# Patient Record
Sex: Female | Born: 1935
Health system: Southern US, Community
[De-identification: ages and names within clinical notes are randomized; demographics above are authoritative.]

## PROBLEM LIST (undated history)

## (undated) DIAGNOSIS — IMO0001 Reserved for inherently not codable concepts without codable children: Secondary | ICD-10-CM

## (undated) DIAGNOSIS — E538 Deficiency of other specified B group vitamins: Secondary | ICD-10-CM

## (undated) DIAGNOSIS — Z9889 Other specified postprocedural states: Secondary | ICD-10-CM

## (undated) DIAGNOSIS — J189 Pneumonia, unspecified organism: Secondary | ICD-10-CM

## (undated) DIAGNOSIS — Z9289 Personal history of other medical treatment: Secondary | ICD-10-CM

## (undated) DIAGNOSIS — M2550 Pain in unspecified joint: Secondary | ICD-10-CM

## (undated) DIAGNOSIS — M810 Age-related osteoporosis without current pathological fracture: Secondary | ICD-10-CM

## (undated) DIAGNOSIS — E785 Hyperlipidemia, unspecified: Secondary | ICD-10-CM

## (undated) DIAGNOSIS — K635 Polyp of colon: Secondary | ICD-10-CM

## (undated) DIAGNOSIS — E042 Nontoxic multinodular goiter: Secondary | ICD-10-CM

## (undated) DIAGNOSIS — D649 Anemia, unspecified: Secondary | ICD-10-CM

## (undated) DIAGNOSIS — I10 Essential (primary) hypertension: Secondary | ICD-10-CM

## (undated) DIAGNOSIS — I739 Peripheral vascular disease, unspecified: Secondary | ICD-10-CM

## (undated) DIAGNOSIS — E039 Hypothyroidism, unspecified: Secondary | ICD-10-CM

## (undated) DIAGNOSIS — Z5189 Encounter for other specified aftercare: Secondary | ICD-10-CM

## (undated) DIAGNOSIS — M1990 Unspecified osteoarthritis, unspecified site: Secondary | ICD-10-CM

## (undated) DIAGNOSIS — N189 Chronic kidney disease, unspecified: Secondary | ICD-10-CM

## (undated) DIAGNOSIS — R112 Nausea with vomiting, unspecified: Secondary | ICD-10-CM

## (undated) DIAGNOSIS — K649 Unspecified hemorrhoids: Secondary | ICD-10-CM

## (undated) HISTORY — DX: Nontoxic multinodular goiter: E04.2

## (undated) HISTORY — PX: FRACTURE SURGERY: SHX138

## (undated) HISTORY — DX: Hyperlipidemia, unspecified: E78.5

## (undated) HISTORY — DX: Unspecified osteoarthritis, unspecified site: M19.90

## (undated) HISTORY — DX: Reserved for inherently not codable concepts without codable children: IMO0001

## (undated) HISTORY — DX: Polyp of colon: K63.5

## (undated) HISTORY — DX: Anemia, unspecified: D64.9

## (undated) HISTORY — DX: Deficiency of other specified B group vitamins: E53.8

## (undated) HISTORY — DX: Encounter for other specified aftercare: Z51.89

## (undated) HISTORY — PX: HEMORRHOIDECTOMY WITH HEMORRHOID BANDING: SHX5633

## (undated) HISTORY — DX: Essential (primary) hypertension: I10

---
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
## 1998-05-31 ENCOUNTER — Other Ambulatory Visit: Admission: RE | Admit: 1998-05-31 | Discharge: 1998-05-31 | Payer: Self-pay | Admitting: *Deleted

## 1998-10-04 ENCOUNTER — Other Ambulatory Visit: Admission: RE | Admit: 1998-10-04 | Discharge: 1998-10-04 | Payer: Self-pay | Admitting: *Deleted

## 1998-10-10 ENCOUNTER — Ambulatory Visit (HOSPITAL_COMMUNITY): Admission: RE | Admit: 1998-10-10 | Discharge: 1998-10-10 | Payer: Self-pay | Admitting: *Deleted

## 1998-10-10 ENCOUNTER — Encounter: Payer: Self-pay | Admitting: *Deleted

## 1999-02-23 ENCOUNTER — Encounter: Admission: RE | Admit: 1999-02-23 | Discharge: 1999-05-24 | Payer: Self-pay | Admitting: Family Medicine

## 2000-02-15 ENCOUNTER — Other Ambulatory Visit: Admission: RE | Admit: 2000-02-15 | Discharge: 2000-02-15 | Payer: Self-pay | Admitting: *Deleted

## 2001-02-27 ENCOUNTER — Other Ambulatory Visit: Admission: RE | Admit: 2001-02-27 | Discharge: 2001-02-27 | Payer: Self-pay | Admitting: *Deleted

## 2001-06-09 ENCOUNTER — Ambulatory Visit (HOSPITAL_COMMUNITY): Admission: RE | Admit: 2001-06-09 | Discharge: 2001-06-09 | Payer: Self-pay | Admitting: Gastroenterology

## 2001-06-09 ENCOUNTER — Encounter (INDEPENDENT_AMBULATORY_CARE_PROVIDER_SITE_OTHER): Payer: Self-pay

## 2002-11-18 ENCOUNTER — Encounter (INDEPENDENT_AMBULATORY_CARE_PROVIDER_SITE_OTHER): Payer: Self-pay | Admitting: *Deleted

## 2002-11-18 ENCOUNTER — Ambulatory Visit (HOSPITAL_COMMUNITY): Admission: RE | Admit: 2002-11-18 | Discharge: 2002-11-18 | Payer: Self-pay | Admitting: Gastroenterology

## 2003-09-02 ENCOUNTER — Other Ambulatory Visit: Admission: RE | Admit: 2003-09-02 | Discharge: 2003-09-02 | Payer: Self-pay | Admitting: Family Medicine

## 2007-09-02 ENCOUNTER — Encounter: Admission: RE | Admit: 2007-09-02 | Discharge: 2007-09-02 | Payer: Self-pay | Admitting: Orthopaedic Surgery

## 2011-04-06 NOTE — Procedures (Signed)
[facility]  Patient:    Alisha [name], Alisha [name]                    MRN: [id number] Proc. Date: 06/09/01 Attending:  Verlin Grills, M.D. CC:         Gretta Arab. Valentina Lucks, M.D.   Procedure Report  PROCEDURE:  Colonoscopy with polypectomy.  PROCEDURE INDICATION:  Ms. Alisha [name] (date of birth [birth year]) is a 77 year old female with Hemoccult positive stool and normal hemoglobin.  I discussed with [name] the complications associated with colonoscopy and polypectomy including intestinal bleeding and intestinal perforation.  [name] has signed the operative permit.  ENDOSCOPIST:  Verlin Grills, M.D.  PREMEDICATION:  Versed 7.5 mg, Demerol 50 mg.  ENDOSCOPE:  Olympus pediatric colonoscope.  DESCRIPTION OF PROCEDURE:  After obtaining informed consent, [name] was placed in the left lateral decubitus position.  I administered intravenous Demerol and intravenous Versed to achieve conscious sedation for the procedure.  The patients blood pressure, oxygen saturations, and cardiac rhythm were monitored throughout the procedure and documented in the medical record.  Anal inspection was normal.  Digital rectal exam was normal.  The Olympus pediatric video colonoscope was introduced into the rectum and easily advanced to the cecum.  Colonic preparation for the exam today was excellent.  RECTUM:  Normal.  SIGMOID COLON AND DESCENDING COLON:  From the distal sigmoid colon, at 40 cm from the anal verge, a 3 mm sessile polyp was removed with electrocautery snare.  From the distal descending colon, three 0.5 mm sessile polyps were removed with the cold biopsy forceps.  All polyps were submitted in one bottle for pathological evaluation.  SPLENIC FLEXURE:  Normal.  TRANSVERSE COLON:  Normal.  HEPATIC FLEXURE:  Normal.  ASCENDING COLON:  Normal.  CECUM AND ILEOCECAL VALVE:  Normal.  ASSESSMENT:  From the left colon, a 3 mm sessile  polyp was removed with the electrocautery snare, and three 0.5 mm sessile polyps were removed with the cold biopsy forceps.  RECOMMENDATIONS:  If the colonic polyps return neoplastic, [name] should undergo a repeat colonoscopy in five years.  If the colon polyp are nonneoplastic pathologically, [name] should undergo a repeat colonoscopy in approximately 10 years. DD:  06/09/01 TD:  06/07/01 Job: [id number]

## 2011-04-06 NOTE — Op Note (Signed)
NAME:  NADJA, LINA NO.:  [id number]   MEDICAL RECORD NO.:  [id number]                   PATIENT TYPE:   LOCATION:                                       FACILITY:  [facility]   PHYSICIAN:  Danise Edge, M.D.                DATE OF BIRTH:   DATE OF PROCEDURE:  11/18/2002  DATE OF DISCHARGE:                                 OPERATIVE REPORT   PROCEDURE INDICATION:  The patient is a 77 year old female with iron  deficiency anemia and guaiac-positive stool.   ENDOSCOPIST:  Danise Edge, M.D.   PREMEDICATION:  Versed 7 mg, Demerol 50 mg.   ENDOSCOPE:  Olympus pediatric gastroscope.   PROCEDURE:  Esophagogastroduodenoscopy with small bowel biopsies.   DESCRIPTION OF PROCEDURE:  After obtaining informed consent, the patient was  placed in the left lateral decubitus position.  I administered intravenous  Demerol and intravenous Versed to achieve conscious sedation for the  procedure.  The patient's blood pressure, oxygen saturation and cardiac  rhythm were monitored throughout the procedure and documented in the medical  record.   The Olympus gastroscope was passed through the posterior hypopharynx into  the proximal esophagus without difficulty.  The hypopharynx, larynx and  vocal cords appeared normal.   Esophagoscopy:  The proximal, mid and lower segments of the esophagus appear  normal.   Gastroscopy:  A retroflexed view of the gastric cardia and fundus was  normal.  The gastric body, antrum and pylorus appear normal.   Duodenoscopy:  The duodenal bulb, mid-duodenum, distal duodenum and proximal  jejunum appear normal endoscopically.   Biopsies:  Multiple biopsies were taken from the second-third portions of  the duodenum to rule out celiac sprue.   ASSESSMENT:  Normal esophagogastroduodenoscopy using the pediatric  gastroscope.  Small bowel biopsies pending.   PROCEDURE:  Proctocolonoscopy to the cecum.   DESCRIPTION OF PROCEDURE:  Anal  inspection was normal.  Digital rectal exam  was normal.  The Olympus pediatric video colonoscope was introduced into the  rectum and easily advanced to the cecum.  The normal-appearing ileocecal  valve was intubated and the distal ileum inspected.  Colonic preparation for  the exam today was excellent.  Rectum:  Normal.  Moderate-sized internal  hemorrhoids are noted.  Sigmoid colon and descending colon normal.  Splenic flexure normal.  Transverse colon normal.  Hepatic flexure normal.  Ascending colon:  There are three 7-mm lipomas in the ascending colon which  were not biopsied.  Cecum and ileocecal valve:  There is a 2 x 2-mm  arteriovenous malformation in the proximal cecum which was endoclipped.  Distal ileum normal.   ASSESSMENT:  1. No signs of colorectal neoplasm or lower gastrointestinal bleeding.  2. Three small lipomas are present in the ascending colon.  3. A small arteriovenous malformation in the proximal cecum was endoclipped.   RECOMMENDATIONS:  The patient should remain  on iron to assess her response  to iron therapy and increasing her hemoglobin.  If there is no significant  increase in her hemoglobin on iron and her small bowel biopsies are normal,  she will need wireless capsule endoscopy of the small intestine.                                               Danise Edge, M.D.    MJ/MEDQ  D:  11/18/2002  T:  11/18/2002  Job:  [id number]

## 2011-07-08 ENCOUNTER — Emergency Department (HOSPITAL_COMMUNITY): Payer: Medicare Other

## 2011-07-08 ENCOUNTER — Inpatient Hospital Stay (HOSPITAL_COMMUNITY)
Admission: EM | Admit: 2011-07-08 | Discharge: 2011-07-14 | DRG: 481 | Disposition: A | Discharge status: Skilled Nursing Facility | Payer: Medicare Other | Attending: Orthopedic Surgery | Admitting: Orthopedic Surgery

## 2011-07-08 DIAGNOSIS — E049 Nontoxic goiter, unspecified: Secondary | ICD-10-CM | POA: Diagnosis present

## 2011-07-08 DIAGNOSIS — W010XXA Fall on same level from slipping, tripping and stumbling without subsequent striking against object, initial encounter: Secondary | ICD-10-CM | POA: Diagnosis present

## 2011-07-08 DIAGNOSIS — Y92009 Unspecified place in unspecified non-institutional (private) residence as the place of occurrence of the external cause: Secondary | ICD-10-CM

## 2011-07-08 DIAGNOSIS — N179 Acute kidney failure, unspecified: Secondary | ICD-10-CM | POA: Diagnosis not present

## 2011-07-08 DIAGNOSIS — E871 Hypo-osmolality and hyponatremia: Secondary | ICD-10-CM | POA: Diagnosis not present

## 2011-07-08 DIAGNOSIS — S72143A Displaced intertrochanteric fracture of unspecified femur, initial encounter for closed fracture: Principal | ICD-10-CM | POA: Diagnosis present

## 2011-07-08 DIAGNOSIS — I10 Essential (primary) hypertension: Secondary | ICD-10-CM | POA: Diagnosis present

## 2011-07-08 DIAGNOSIS — D62 Acute posthemorrhagic anemia: Secondary | ICD-10-CM | POA: Diagnosis not present

## 2011-07-08 DIAGNOSIS — E119 Type 2 diabetes mellitus without complications: Secondary | ICD-10-CM | POA: Diagnosis present

## 2011-07-08 DIAGNOSIS — Z86718 Personal history of other venous thrombosis and embolism: Secondary | ICD-10-CM

## 2011-07-08 HISTORY — PX: FEMUR SURGERY: SHX943

## 2011-07-08 LAB — URINALYSIS, ROUTINE W REFLEX MICROSCOPIC
Bilirubin Urine: NEGATIVE
Glucose, UA: NEGATIVE mg/dL
Ketones, ur: NEGATIVE mg/dL
Leukocytes, UA: NEGATIVE
Nitrite: NEGATIVE
Specific Gravity, Urine: 1.021 (ref 1.005–1.030)
pH: 5 (ref 5.0–8.0)

## 2011-07-08 LAB — HEPATIC FUNCTION PANEL
Alkaline Phosphatase: 45 U/L (ref 39–117)
Bilirubin, Direct: 0.1 mg/dL (ref 0.0–0.3)
Indirect Bilirubin: 0.2 mg/dL — ABNORMAL LOW (ref 0.3–0.9)
Total Bilirubin: 0.3 mg/dL (ref 0.3–1.2)

## 2011-07-08 LAB — CBC
Hemoglobin: 11.1 g/dL — ABNORMAL LOW (ref 12.0–15.0)
MCH: 27.1 pg (ref 26.0–34.0)
MCV: 84.1 fL (ref 78.0–100.0)
Platelets: 259 10*3/uL (ref 150–400)
RBC: 4.1 MIL/uL (ref 3.87–5.11)
WBC: 9 10*3/uL (ref 4.0–10.5)

## 2011-07-08 LAB — BASIC METABOLIC PANEL
CO2: 28 mEq/L (ref 19–32)
Chloride: 100 mEq/L (ref 96–112)
GFR calc Af Amer: 60 mL/min — ABNORMAL LOW (ref >60)
Potassium: 4.3 mEq/L (ref 3.5–5.1)
Sodium: 138 mEq/L (ref 135–145)

## 2011-07-08 LAB — DIFFERENTIAL
Eosinophils Absolute: 0.2 10*3/uL (ref 0.0–0.7)
Lymphocytes Relative: 17 % (ref 12–46)
Lymphs Abs: 1.5 10*3/uL (ref 0.7–4.0)
Monocytes Relative: 7 % (ref 3–12)
Neutro Abs: 6.6 10*3/uL (ref 1.7–7.7)
Neutrophils Relative %: 74 % (ref 43–77)

## 2011-07-08 LAB — PROTIME-INR: Prothrombin Time: 12.6 seconds (ref 11.6–15.2)

## 2011-07-09 LAB — TSH: TSH: 6.025 u[IU]/mL — ABNORMAL HIGH (ref 0.350–4.500)

## 2011-07-09 LAB — GLUCOSE, CAPILLARY

## 2011-07-09 LAB — POCT I-STAT 4, (NA,K, GLUC, HGB,HCT)
Glucose, Bld: 164 mg/dL — ABNORMAL HIGH (ref 70–99)
Potassium: 4.5 mEq/L (ref 3.5–5.1)
Sodium: 138 mEq/L (ref 135–145)

## 2011-07-09 LAB — URINE CULTURE

## 2011-07-09 LAB — PROTIME-INR
INR: 1.19 (ref 0.00–1.49)
Prothrombin Time: 15.4 seconds — ABNORMAL HIGH (ref 11.6–15.2)

## 2011-07-09 LAB — PREPARE RBC (CROSSMATCH)

## 2011-07-09 LAB — T3, FREE: T3, Free: 3.3 pg/mL (ref 2.3–4.2)

## 2011-07-09 NOTE — H&P (Signed)
  NAMEKIMIA, FINAN NO.:  [id number]  MEDICAL RECORD NO.:  [id number]  LOCATION:  1611                         FACILITY:  [facility]  PHYSICIAN:  Myrtie Neither, MD      DATE OF BIRTH:  September 08, 1932  DATE OF ADMISSION:  07/08/2011 DATE OF DISCHARGE:                             HISTORY & PHYSICAL   CHIEF COMPLAINT:  Painful left hip.  HISTORY OF PRESENT ILLNESS:  This is a 77 year old female who was adjusting a chair before sitting __________ and appeared to have missed her step, falling and landing onto her left hip.  Patient complained of severe pain at the left hip and was brought to [facility] Emergency Department for treatment.  The patient denies any loss of consciousness or sustaining any other injuries.  PAST MEDICAL HISTORY: 1. Hypertension. 2. Diabetes mellitus.  ALLERGIES:  None known.         MEDICATIONS:  Are that antihypertensive and will use diabetic type 2 medications.  SOCIAL HISTORY:  No history of use of illicit drugs, alcohol, or tobacco.  REVIEW OF SYSTEMS:  __________ history of present illness, no cardiac, respiratory, urinary, or bowel symptoms.  FAMILY HISTORY:  Noncontributory.  PHYSICAL EXAMINATION:  GENERAL:  Alert, oriented, in no acute distress. VITAL SIGNS:  Temperature 98, pulse 85, respirations 20, blood pressure 162/89, O2 saturation is 98%. HEENT:  Head normocephalic.  Eyes, conjunctivae clear. NECK:  Supple. CHEST:  Clear.  No rib tenderness.  Good respiratory excursion. EXTREMITIES:  Patient is able to actively move both upper extremities and right lower extremity.  Left hip, tender anterolaterally, short, and externally rotated.  Pulses are intact.  X-RAY:  Revealed comminuted intertrochanteric fracture of the left hip.  IMPRESSION: 1. Comminuted intertrochanteric fracture of the left hip. 2. History of high blood pressure. 3. History of diabetes mellitus.  PLAN:  ORIF, left hip.     Myrtie Neither,  MD     AC/MEDQ  D:  07/08/2011  T:  07/09/2011  Job:  [id number]  Electronically Signed by Myrtie Neither MD on 07/09/2011 02:04:47 PM

## 2011-07-10 ENCOUNTER — Other Ambulatory Visit (HOSPITAL_COMMUNITY): Payer: No Typology Code available for payment source

## 2011-07-10 ENCOUNTER — Inpatient Hospital Stay (HOSPITAL_COMMUNITY): Payer: Medicare Other

## 2011-07-10 LAB — BASIC METABOLIC PANEL
BUN: 23 mg/dL (ref 6–23)
Creatinine, Ser: 1.07 mg/dL (ref 0.50–1.10)
GFR calc Af Amer: >60 mL/min (ref >60)
GFR calc non Af Amer: 50 mL/min — ABNORMAL LOW (ref >60)
Potassium: 4.3 mEq/L (ref 3.5–5.1)

## 2011-07-10 LAB — CBC
HCT: 28.3 % — ABNORMAL LOW (ref 36.0–46.0)
MCHC: 32.9 g/dL (ref 30.0–36.0)
MCV: 85.2 fL (ref 78.0–100.0)
Platelets: 173 10*3/uL (ref 150–400)
RDW: 14.1 % (ref 11.5–15.5)
WBC: 12.2 10*3/uL — ABNORMAL HIGH (ref 4.0–10.5)

## 2011-07-10 LAB — GLUCOSE, CAPILLARY
Glucose-Capillary: 175 mg/dL — ABNORMAL HIGH (ref 70–99)
Glucose-Capillary: 136 mg/dL — ABNORMAL HIGH (ref 70–99)
Glucose-Capillary: 119 mg/dL — ABNORMAL HIGH (ref 70–99)
Glucose-Capillary: 110 mg/dL — ABNORMAL HIGH (ref 70–99)

## 2011-07-10 LAB — PROTIME-INR: Prothrombin Time: 14.8 seconds (ref 11.6–15.2)

## 2011-07-11 LAB — BASIC METABOLIC PANEL
BUN: 26 mg/dL — ABNORMAL HIGH (ref 6–23)
Calcium: 8.3 mg/dL — ABNORMAL LOW (ref 8.4–10.5)
Creatinine, Ser: 1.29 mg/dL — ABNORMAL HIGH (ref 0.50–1.10)
GFR calc non Af Amer: 40 mL/min — ABNORMAL LOW (ref >60)
Glucose, Bld: 89 mg/dL (ref 70–99)
Potassium: 4.4 mEq/L (ref 3.5–5.1)

## 2011-07-11 LAB — CBC
MCH: 28.3 pg (ref 26.0–34.0)
MCHC: 33.1 g/dL (ref 30.0–36.0)
MCV: 85.7 fL (ref 78.0–100.0)
Platelets: 191 10*3/uL (ref 150–400)
RDW: 14.3 % (ref 11.5–15.5)

## 2011-07-11 LAB — GLUCOSE, CAPILLARY

## 2011-07-12 LAB — GLUCOSE, CAPILLARY
Glucose-Capillary: 121 mg/dL — ABNORMAL HIGH (ref 70–99)
Glucose-Capillary: 129 mg/dL — ABNORMAL HIGH (ref 70–99)
Glucose-Capillary: 156 mg/dL — ABNORMAL HIGH (ref 70–99)
Glucose-Capillary: 98 mg/dL (ref 70–99)

## 2011-07-12 LAB — HEMOGLOBIN AND HEMATOCRIT, BLOOD
HCT: 29.9 % — ABNORMAL LOW (ref 36.0–46.0)
Hemoglobin: 10.2 g/dL — ABNORMAL LOW (ref 12.0–15.0)

## 2011-07-12 LAB — BASIC METABOLIC PANEL
CO2: 29 mEq/L (ref 19–32)
Chloride: 97 mEq/L (ref 96–112)
Creatinine, Ser: 1.06 mg/dL (ref 0.50–1.10)
Glucose, Bld: 119 mg/dL — ABNORMAL HIGH (ref 70–99)

## 2011-07-12 LAB — CBC
Hemoglobin: 7.7 g/dL — ABNORMAL LOW (ref 12.0–15.0)
MCV: 84.6 fL (ref 78.0–100.0)
Platelets: 223 10*3/uL (ref 150–400)
RBC: 2.72 MIL/uL — ABNORMAL LOW (ref 3.87–5.11)
WBC: 7.6 10*3/uL (ref 4.0–10.5)

## 2011-07-12 LAB — CROSSMATCH
Antibody Screen: NEGATIVE
Unit division: 0

## 2011-07-13 LAB — CROSSMATCH
ABO/RH(D): A POS
Antibody Screen: NEGATIVE
Unit division: 0

## 2011-07-13 LAB — CBC
Platelets: 245 10*3/uL (ref 150–400)
RDW: 14.5 % (ref 11.5–15.5)
WBC: 8.5 10*3/uL (ref 4.0–10.5)

## 2011-07-13 LAB — BASIC METABOLIC PANEL
Calcium: 8.4 mg/dL (ref 8.4–10.5)
Creatinine, Ser: 0.9 mg/dL (ref 0.50–1.10)
GFR calc non Af Amer: >60 mL/min (ref >60)
Sodium: 135 mEq/L (ref 135–145)

## 2011-07-13 LAB — GLUCOSE, CAPILLARY
Glucose-Capillary: 137 mg/dL — ABNORMAL HIGH (ref 70–99)
Glucose-Capillary: 123 mg/dL — ABNORMAL HIGH (ref 70–99)

## 2011-07-13 LAB — PROTIME-INR: INR: 2.18 — ABNORMAL HIGH (ref 0.00–1.49)

## 2011-07-16 LAB — GLUCOSE, CAPILLARY: Glucose-Capillary: 81 mg/dL (ref 70–99)

## 2011-07-17 NOTE — Discharge Summary (Signed)
NAMEMarland Kitchen  [name], Alisha [name] NO.:  [id number]  MEDICAL RECORD NO.:  [id number]  LOCATION:  1611                         FACILITY:  [facility]  PHYSICIAN:  Myrtie Neither, MD      DATE OF BIRTH:  08-14-1932  DATE OF ADMISSION:  07/08/2011 DATE OF DISCHARGE:  07/13/2011                              DISCHARGE SUMMARY   ADMITTING DIAGNOSES:  Comminuted intertrochanteric fracture of the left hip with history of hypertension, diabetes mellitus, thrombus in the past, and hernia repair.  DISCHARGE DIAGNOSES:  Comminuted intertrochanteric fracture of left hip with history of hypertension, diabetes mellitus, thrombus in the past, hernia repair, also acute blood loss anemia, and thyroid goiter.  INFECTIONS:  None.  OPERATION:  Open reduction and internal fixation and intramedullary nailing, left hip intertrochanteric fracture.  The patient received 4 units of packed blood cells on hospital stay.  PERTINENT HISTORY:  This is a 77 year old female who has lost her balance while moving a chair and sustained injury to her left hip.  The patient was brought to [facility] Emergency Room for treatment, and was found to have comminuted intertrochanteric 3-part fracture of the left hip.  THE left hip was externally rotated and_shortened .  Neurovascular status was intact.  X-ray reveals comminuted 3-part fracture of the left intertrochanteric fracture.  HOSPITAL COURSE:  The patient underwent preop laboratories, CBC, EKG, chest x-ray, PT/PTT, platelet count, UA, and C-MET.  The patient also seen by in-house in the office for her hypertension and diabetes as well as evaluation for a finding seen on chest x-rays suggesting enlargement of thyroid.  The patient was found to be stable enough to undergo surgery.  The patient underwent ORIF with IM nailing of the intertrochanteric fracture, left hip.  The patient received 2 units of packed cells during the procedure and 2 units postop.  The  patient was started on physical therapy, nonweightbearing on the left side, progressed to toe-touch weightbearing on the left side.  Pain was eventually brought under control.  The patient received _pre and postoperative IV antibiotics.  CT of the thyroid revealed 2 thyroid nodules, to be evaluated on an outpatient basis.  The patient was euthyroid.  Wound was healing quite well.  The patients pain was controlled with the use of Vicodin 1-2 q.6 p.r.n. for pain.  The patient is on prophylactic Coumadin 5 mg daily, INRs, Vicodin 1-2 q.6 p.r.n. for pain.  MEDICATIONS: 1. Multivitamins daily. 2. The patient's aspirin is on hold. 3. The patient takes glipizide XL 5 mg daily. 4. Metformin 850 mg 3 times a day. 5. Toprol-XL 100 mg daily. 6. Diovan HCT 160/25. 7. Valsartan/hydrochlorothiazide 160/25 daily. 8. __________ injection every 3 months. 9. Zetia 10 mg daily. 10.Ferrous sulfate 325 mg t.i.d. 11.Fish oil over-the-counter __________daily. 12.Os-Cal daily. 13.Coumadin 5 mg daily. 14.Ambien 5 mg h.s. p.r.n. 15.Colace 100 mg b.i.d.  The patient is to continue toe-touch weightbearing on the left side with physical therapy.  Daily dressing changes to the left hip.  The patient is to return to office in 10 days.  The patient being discharged in a stable and satisfactory condition.     Myrtie Neither, MD  AC/MEDQ  D:  07/13/2011  T:  07/13/2011  Job:  [id number]  Electronically Signed by Myrtie Neither MD on 07/17/2011 12:16:05 PM

## 2011-07-17 NOTE — Op Note (Signed)
  NAMEMarland Kitchen  Alisha, [name] NO.:  [id number]  MEDICAL RECORD NO.:  [id number]  LOCATION:  1611                         FACILITY:  [facility]  PHYSICIAN:  Myrtie Neither, MD      DATE OF BIRTH:  June 01, 1932  DATE OF PROCEDURE:  07/08/2011 DATE OF DISCHARGE:                              OPERATIVE REPORT   PREOPERATIVE DIAGNOSIS:  Comminuted intertrochanteric 3-part fracture, left hip.  POSTOPERATIVE DIAGNOSIS:  Comminuted intertrochanteric 3-part fracture, left hip.  OPERATION:  Open reduction and internal fixation, left hip, with intramedullary nailing and K-wire, Zimmer.  PROCEDURE IN DETAIL:  The patient was taken to the operating room, given preop medications, given general anesthesia, and intubated.  The patient was placed on a fracture table.  Manipulated reduction of the fracture was done initially with use of C-arm visibility.  Next, the left hip was prepped with DuraPrep and draped in sterile manner.  Lateral incision made over the left hip over the trochanter going through the skin and subcutaneous tissue down to the fascia and the vastus lateralis.  Soft tissue was subperiosteally elevated from the trochanter.  Medial border of the greater trochanter was palpated.  Guidewire with an awl was put in place and visualized with the C-arm.  Guidewire was placed down the trochanter into the femoral canal with manipulation of the extremity into abduction as well as adduction.  After placement of the guidewire, reaming was done up to 13 mm followed by placement of a 180 mm, size 11 IM nail.  This was hammered in place over the guidewire under the guidance of C-arm.  Next, femoral head screw guide was in place.  Trocar was placed through the guidewire laterally into the femoral head.  Large screw was measured 100 mm, secondary screw was that of 75 mm.  This cross-fixed into the femoral head and across the fracture site. Fracture was held, reduced with the 3rd fragment  being approximated with the use of Zimmer cable.  This was visualized in AP and lateral view. Third transfixing distal screw was put in place, which is 32 mm.  Set screw proximally was screwed down.  AP and lateral of the left hip visualized, good reduction as well as good placement.  Copious and abundant irrigation was done.  The patient did receive 2 units of packed cells during the procedure.  Wound closure was done with #1 Vicryl for the fascia, 2-0 for the subcutaneous and skin staples for the skin. Compressive dressing was applied.  The patient tolerated the procedure quite well, went to recovery room in stable and satisfactory condition.     Myrtie Neither, MD     AC/MEDQ  D:  07/08/2011  T:  07/09/2011  Job:  [id number]  Electronically Signed by Myrtie Neither MD on 07/17/2011 12:09:31 PM

## 2011-07-24 NOTE — Consult Note (Signed)
NAME:  Alisha [name], Alisha [name] NO.:  [id number]  MEDICAL RECORD NO.:  [id number]  LOCATION:  [facility]                         FACILITY:  [facility]  PHYSICIAN:  Calvert Cantor, M.D.     DATE OF BIRTH:  [birth year]  DATE OF CONSULTATION: DATE OF DISCHARGE:                                CONSULTATION   PRIMARY CARE PHYSICIAN:  Gretta Arab. Valentina Lucks, M.D.  REQUESTING PHYSICIAN:  Myrtie Neither, M.D.  REASON FOR CONSULT:  Medical management and medical clearance for surgery.  PRESENTING COMPLAINT:  Fall.  HISTORY OF PRESENT ILLNESS:  This is a 77 year old female with hypertension and diabetes, who lost her balance and fell from a standing position to the floor landing on her left hip and thigh with subsequent hip pain.  The patient currently states that she has a dull ache, which is worse when she moves.  She does not recall having any dizziness prior to the fall.  She has not had many falls lately.  She do not have any neurological or cardiac symptoms prior to or after the fall.  She did not lose consciousness.  PAST MEDICAL HISTORY: 1. Hypertension. 2. Diabetes mellitus type 2, not insulin requiring. 3. The patient states that she had a clot in her left ankle 3-4 years     ago.  When looking back through her radiology reports, I do see in     2008 that she had a superficial femoral thrombus. 4. Hernia repaired at age at 54.  SOCIAL HISTORY:  She has never smoked and does not drink alcohol or use drugs.  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  ALLERGIES:  No known drug allergies.  MEDICATIONS:  She does not have a list and is able to tell me some of them from memory. 1. She is on 2 blood pressure meds, names of which she cannot recall. 2. Metformin 3 times a day, dose uncertain. 3. Zetia 10 mg daily. 4. Multivitamins daily. 5. Fish oil daily. 6. Os-Cal once a day. 7. Iron tablet daily. 8. B12 shots every 3 months for  anemia. 9. Aspirin 81 mg daily.  REVIEW OF SYSTEMS:  No recent weight loss or weight gain.  No frequent headaches, no blurred vision or double vision.  No sore throat, sinus trouble or earache.  RESPIRATORY:  No shortness of breath, cough, or wheezing.  CARDIAC:  No chest pain or pressure, no palpitations, no pedal edema.  Her feet do swell occasionally.  GI:  No nausea, vomiting, diarrhea, or constipation.  GU:  No dysuria or hematuria. Hematologically, bruises easily.  SKIN:  No rash.  MUSCULOSKELETAL:  No arthritis or back pain.  Neurological:  No history of strokes or seizures.  No focal numbness, weakness, or tingling.  PSYCHOLOGICAL:  No anxiety or depression.  PHYSICAL EXAMINATION:  GENERAL:  Elderly female, lying in bed, in no acute distress. VITAL SIGNS:  Blood pressure 162/89, pulse 85, respiratory rate 20, temperature 98.0, oxygen saturation 98%. HEENT:  Pupils equal, round, and reactive to light.  Extraocular movements are intact.  Conjunctiva is pink.  No scleral icterus.  Oral mucosa  is moist.  She is wearing dentures.  Oropharynx clear. NECK:  Supple.  No thyromegaly, lymphadenopathy, or carotid bruits. HEART:  Regular rate and rhythm.  No murmurs, rubs, or gallops. LUNGS:  Clear bilaterally.  Normal respiratory effort.  No use of accessory muscles. ABDOMEN:  Soft, nontender, nondistended.  Bowel sounds positive.  No organomegaly. EXTREMITIES:  No cyanosis, clubbing, or edema.  Pedal pulses positive. Left limb is shorter than the right. NEUROLOGICALLY:  Cranial nerves II-XII are intact.  She is able to move upper extremities and right lower extremity.  She has significant pain with moving the left. SKIN:  Warm, dry, few bruises and varicose veins on her legs. PSYCHOLOGICALLY:  Awake, alert, oriented x3.  Mood and affect normal.  LABORATORY DATA:  Pertinent blood work, hemoglobin is slightly low at 11.1, hematocrit 34.5.  Metabolic panel reveals an elevated BUN  and creatinine ratio with a BUN of 21 and creatinine of 1.08.  UA is negative for infection.  Specific gravity is 1.021.  X-ray of left femur 2-view reveals a comminuted intertrochanteric femur fracture and mild osteopenia.  X-ray of pelvis 2-view reveals intertrochanteric left femur fracture.  Chest x-ray 1-view revealed increased paratracheal density bilaterally, which suggests enlargement of the thyroid.  Consider further evaluation with ultrasound.  No evidence of acute cardiopulmonary abnormality. Degenerative changes noted in the spine.  Superior mediastinal density is identified as thickened paratracheal line bilaterally.  Minimal left lower lobe atelectasis.  EKG revealed a sinus rhythm at 83 beats per minute.  ASSESSMENT AND PLAN: 1. Left intertrochanteric femur fracture.  The patient currently has     no contraindications for surgery.  She is a low risk for the     proposed surgery, which will be an open reduction and internal     fixation.  According to Dr. Montez Morita, she may lose quite a bit of     blood.  In addition, she is on a baby aspirin.  We will monitor her     hemoglobin carefully.  We will continue iron as well. 2. Hypertension, trying to obtain her home medication dosages. 3. Diabetes mellitus, on metformin.  Again, trying to obtain dosages.     For now, we will place on a sliding scale since she is n.p.o., will     be q.4 h. and sensitive. 4. Possibly enlarged thyroid seen on the chest x-ray.  She will need     an ultrasound of her thyroid at some point.  At this point, she     does not have any symptoms of hyper or hypothyroidism as far as I     can tell.  We will order TSH and free T4. 5. Anemia, which is chronic.  Continue iron. 6. The patient would like to be a full code. 7. DVT prophylaxis will be per Dr. Montez Morita.  Time on the patient care was 50 minutes.     Calvert Cantor, M.D.     SR/MEDQ  D:  07/08/2011  T:  07/08/2011  Job:  [id number]  cc:    Gretta Arab. Valentina Lucks, M.D. Fax: [phone]  Electronically Signed by Calvert Cantor M.D. on 07/24/2011 02:22:20 PM

## 2011-08-03 ENCOUNTER — Other Ambulatory Visit (HOSPITAL_COMMUNITY): Payer: Self-pay | Admitting: Internal Medicine

## 2011-08-03 ENCOUNTER — Ambulatory Visit (HOSPITAL_COMMUNITY)
Admission: RE | Admit: 2011-08-03 | Discharge: 2011-08-03 | Disposition: A | Discharge status: Home / Self Care | Payer: Medicare Other | Source: Ambulatory Visit | Attending: Internal Medicine | Admitting: Internal Medicine

## 2011-08-03 DIAGNOSIS — M25559 Pain in unspecified hip: Secondary | ICD-10-CM | POA: Insufficient documentation

## 2011-08-03 DIAGNOSIS — R52 Pain, unspecified: Secondary | ICD-10-CM

## 2011-09-17 ENCOUNTER — Other Ambulatory Visit: Payer: Self-pay | Admitting: Family Medicine

## 2011-09-17 DIAGNOSIS — E042 Nontoxic multinodular goiter: Secondary | ICD-10-CM

## 2011-09-20 ENCOUNTER — Other Ambulatory Visit (HOSPITAL_COMMUNITY)
Admission: RE | Admit: 2011-09-20 | Discharge: 2011-09-20 | Disposition: A | Discharge status: Home / Self Care | Payer: Medicare Other | Source: Ambulatory Visit | Attending: Interventional Radiology | Admitting: Interventional Radiology

## 2011-09-20 ENCOUNTER — Ambulatory Visit
Admission: RE | Admit: 2011-09-20 | Discharge: 2011-09-20 | Disposition: A | Discharge status: Home / Self Care | Payer: Medicare Other | Source: Ambulatory Visit | Attending: Family Medicine | Admitting: Family Medicine

## 2011-09-20 DIAGNOSIS — E042 Nontoxic multinodular goiter: Secondary | ICD-10-CM

## 2011-09-20 DIAGNOSIS — E049 Nontoxic goiter, unspecified: Secondary | ICD-10-CM | POA: Insufficient documentation

## 2011-10-18 ENCOUNTER — Ambulatory Visit (INDEPENDENT_AMBULATORY_CARE_PROVIDER_SITE_OTHER): Payer: Medicare Other | Admitting: Surgery

## 2011-10-18 ENCOUNTER — Encounter (INDEPENDENT_AMBULATORY_CARE_PROVIDER_SITE_OTHER): Payer: Self-pay | Admitting: Surgery

## 2011-10-18 VITALS — BP 132/78 | HR 64 | Temp 97.2°F | Resp 16 | Ht 61.0 in | Wt 157.0 lb

## 2011-10-18 DIAGNOSIS — E042 Nontoxic multinodular goiter: Secondary | ICD-10-CM

## 2011-10-18 NOTE — Progress Notes (Signed)
Chief Complaint  Patient presents with  . New Evaluation    eval of thyroid nodule - referral by Dr. Maurice Small    HISTORY: Patient is a 77 year old white female referred by her primary care physician with a newly diagnosed multinodular thyroid. Patient had undergone a CT scan with an incidental finding of thyroid nodules. Patient subsequently underwent a thyroid ultrasound which showed bilateral thyroid nodules. There were dominant nodules on both the left and right sides. The right-sided nodule measured 23 mm. Ultrasound-guided fine-needle aspiration biopsy was obtained and showed oncocytic follicular cells.  A Hurthle cell neoplasm could not be excluded. Patient is now referred to surgery for consideration for resection for definitive diagnosis.  Patient has no prior history of thyroid disease. She has never been on thyroid medication. She has had no prior head or neck surgery. There is no family history of thyroid disease. There is no family history of other endocrinopathy.   Past Medical History  Diagnosis Date  . Blood transfusion   . Arthritis   . Anemia   . Diabetes mellitus   . Hyperlipidemia   . Hypertension   . B12 deficiency   . Osteopenia   . Colon polyps   . Multiple thyroid nodules      Current Outpatient Prescriptions  Medication Sig Dispense Refill  . aspirin 81 MG tablet Take 81 mg by mouth daily.        . Calcium Carbonate-Vitamin D (CALCIUM + D PO) Take by mouth daily.        . Ferrous Sulfate (IRON) 90 (18 FE) MG TABS Take by mouth as directed.        Marland Kitchen GLIPIZIDE XL 5 MG 24 hr tablet       . meloxicam (MOBIC) 15 MG tablet       . metFORMIN (GLUCOPHAGE) 850 MG tablet       . metoprolol (TOPROL-XL) 100 MG 24 hr tablet       . Multiple Vitamin (M.V.I. ADULT IV) Inject into the vein.        . Multiple Vitamin (MULTIVITAMIN PO) Take by mouth daily.        . valsartan-hydrochlorothiazide (DIOVAN-HCT) 160-25 MG per tablet       . vitamin B-12 (CYANOCOBALAMIN)  1000 MCG tablet Take 1,000 mcg by mouth daily.        Marland Kitchen ZETIA 10 MG tablet          Allergies  Allergen Reactions  . Ace Inhibitors     Cough   . Evista (Raloxifene Hydrochloride)     cramps   . Zocor (Simvastatin - High Dose)     Cramps      History reviewed. No pertinent family history.   History   Social History  . Marital Status: Married    Spouse Name: N/A    Number of Children: N/A  . Years of Education: N/A   Social History Main Topics  . Smoking status: Never Smoker   . Smokeless tobacco: Never Used  . Alcohol Use: No  . Drug Use: No  . Sexually Active:    Other Topics Concern  . None   Social History Narrative  . None     REVIEW OF SYSTEMS - PERTINENT POSITIVES ONLY: Denies tremors. Denies palpitations. Denies dysphagia.  EXAM: Filed Vitals:   10/18/11 0957  BP: 132/78  Pulse: 64  Temp: 97.2 F (36.2 C)  Resp: 16    HEENT: normocephalic; pupils equal and reactive; sclerae clear; dentition good;  mucous membranes moist NECK:  Palpable nodule on right with swallowing approx 2.5 cm; symmetric on extension; no palpable anterior or posterior cervical lymphadenopathy; no supraclavicular masses; no tenderness CHEST: clear to auscultation bilaterally without rales, rhonchi, or wheezes CARDIAC: regular rate and rhythm without significant murmur; peripheral pulses are full EXT:  non-tender without edema; no deformity NEURO: no gross focal deficits; no sign of tremor   LABORATORY RESULTS: See E-Chart for most recent results   RADIOLOGY RESULTS: See E-Chart or I-Site for most recent results   IMPRESSION: Dominant right thyroid nodule, 2.3 cm, with cytologic atypia   PLAN: The patient and her son and I discussed the indications for surgery. We reviewed the pathology results and the ultrasound results and I provided copies of these reports to the patient's son. I have recommended total thyroidectomy given that the patient has bilateral thyroid  nodules and that the patient has Hurthle cell changes on the right side. I quoted her risk of malignancy of approximately 20%.  We discussed total thyroidectomy. We discussed the surgical incision and the hospital stay to be anticipated. We discussed potential complications including injury to parathyroid glands and to the recurrent laryngeal nerves. They understand and wish to proceed. We will make arrangements for surgery in the near future.  The risks and benefits of the procedure have been discussed at length with the patient.  The patient understands the proposed procedure, potential alternative treatments, and the course of recovery to be expected.  All of the patient's questions have been answered at this time.  The patient wishes to proceed with surgery and will schedule a date for their procedure through our office staff.  Velora Heckler, MD, FACS General & Endocrine Surgery [facility]    Visit Diagnoses: 1. Multinodular goiter (nontoxic), atypia on cytopathology     Primary Care Physician: Astrid Divine, MD, MD

## 2011-11-16 ENCOUNTER — Encounter (HOSPITAL_COMMUNITY): Payer: Self-pay | Admitting: Pharmacy Technician

## 2011-11-28 ENCOUNTER — Ambulatory Visit (HOSPITAL_COMMUNITY)
Admission: RE | Admit: 2011-11-28 | Discharge: 2011-11-28 | Disposition: A | Discharge status: Home / Self Care | Payer: Medicare Other | Source: Ambulatory Visit | Attending: Surgery | Admitting: Surgery

## 2011-11-28 ENCOUNTER — Encounter (HOSPITAL_COMMUNITY): Payer: Self-pay

## 2011-11-28 ENCOUNTER — Encounter (HOSPITAL_COMMUNITY)
Admission: RE | Admit: 2011-11-28 | Discharge: 2011-11-28 | Disposition: A | Discharge status: Home / Self Care | Payer: Medicare Other | Source: Ambulatory Visit | Attending: Surgery | Admitting: Surgery

## 2011-11-28 DIAGNOSIS — I10 Essential (primary) hypertension: Secondary | ICD-10-CM | POA: Insufficient documentation

## 2011-11-28 DIAGNOSIS — Z01818 Encounter for other preprocedural examination: Secondary | ICD-10-CM | POA: Insufficient documentation

## 2011-11-28 DIAGNOSIS — Z01812 Encounter for preprocedural laboratory examination: Secondary | ICD-10-CM | POA: Insufficient documentation

## 2011-11-28 DIAGNOSIS — Z01811 Encounter for preprocedural respiratory examination: Secondary | ICD-10-CM | POA: Diagnosis not present

## 2011-11-28 HISTORY — DX: Peripheral vascular disease, unspecified: I73.9

## 2011-11-28 HISTORY — DX: Other specified postprocedural states: Z98.890

## 2011-11-28 HISTORY — DX: Nausea with vomiting, unspecified: R11.2

## 2011-11-28 LAB — SURGICAL PCR SCREEN
MRSA, PCR: NEGATIVE
Staphylococcus aureus: NEGATIVE

## 2011-11-28 LAB — BASIC METABOLIC PANEL
GFR calc Af Amer: 47 mL/min — ABNORMAL LOW (ref >90)
GFR calc non Af Amer: 40 mL/min — ABNORMAL LOW (ref >90)
Potassium: 4.2 mEq/L (ref 3.5–5.1)
Sodium: 140 mEq/L (ref 135–145)

## 2011-11-28 LAB — CBC
MCHC: 32.3 g/dL (ref 30.0–36.0)
Platelets: 312 10*3/uL (ref 150–400)
RDW: 13.1 % (ref 11.5–15.5)

## 2011-11-28 NOTE — Progress Notes (Signed)
Faxed to [facility] 

## 2011-11-28 NOTE — Patient Instructions (Addendum)
20 Alisha [name]  11/28/2011   Your procedure is scheduled on:  **12/04/11   Surgery [id number]  Tuesday  Report to [facility] at 0515 AM.  Call this number if you have problems the morning of surgery: [phone]        [facility]   PST [id number]             Hold aspirin, vitamins and inflammatories 7 days pre op  Remember:   Do not eat food:After Midnight.  Monday night  May have clear liquids:until Midnight . Monday night  Clear liquids include soda, tea, black coffee, apple or grape juice, broth.  Take these medicines the morning of surgery with A SIP OF WATER:  TOPROL WITH SIP WATER       NO BLOOD SUGAR MEDICINE AM OF SURGERY   Do not wear jewelry, make-up or nail polish.  Do not wear lotions, powders, or perfumes. You may wear deodorant.  Do not shave 48 hours prior to surgery.  Do not bring valuables to the hospital.  Contacts, dentures or bridgework may not be worn into surgery.  Leave suitcase in the car. After surgery it may be brought to your room.  For patients admitted to the hospital, checkout time is 11:00 AM the day of discharge.   Patients discharged the day of surgery will not be allowed to drive home.  Name and phone number of your driver:son        [name] [phone]  Special Instructions: CHG Shower Use Special Wash: 1/2 bottle night before surgery and 1/2 bottle morning of surgery. Regular soap face and privates   Please read over the following fact sheets that you were given: MRSA Information

## 2011-11-28 NOTE — Progress Notes (Signed)
Quick Note:  These results are acceptable for scheduled surgery. TMG ______ 

## 2011-11-30 DIAGNOSIS — M6281 Muscle weakness (generalized): Secondary | ICD-10-CM | POA: Diagnosis not present

## 2011-12-04 ENCOUNTER — Encounter (HOSPITAL_COMMUNITY): Payer: Self-pay | Admitting: Anesthesiology

## 2011-12-04 ENCOUNTER — Encounter (HOSPITAL_COMMUNITY): Payer: Self-pay | Admitting: *Deleted

## 2011-12-04 ENCOUNTER — Inpatient Hospital Stay (HOSPITAL_COMMUNITY)
Admission: RE | Admit: 2011-12-04 | Discharge: 2011-12-05 | DRG: 627 | Disposition: A | Discharge status: Home / Self Care | Payer: Medicare Other | Source: Ambulatory Visit | Attending: Surgery | Admitting: Surgery

## 2011-12-04 ENCOUNTER — Other Ambulatory Visit (INDEPENDENT_AMBULATORY_CARE_PROVIDER_SITE_OTHER): Payer: Self-pay | Admitting: Surgery

## 2011-12-04 ENCOUNTER — Ambulatory Visit (HOSPITAL_COMMUNITY): Payer: Medicare Other | Admitting: Anesthesiology

## 2011-12-04 ENCOUNTER — Encounter (HOSPITAL_COMMUNITY)
Admission: RE | Disposition: A | Discharge status: Home / Self Care | Payer: Self-pay | Source: Ambulatory Visit | Attending: Surgery

## 2011-12-04 DIAGNOSIS — E119 Type 2 diabetes mellitus without complications: Secondary | ICD-10-CM | POA: Diagnosis not present

## 2011-12-04 DIAGNOSIS — D449 Neoplasm of uncertain behavior of unspecified endocrine gland: Secondary | ICD-10-CM | POA: Diagnosis not present

## 2011-12-04 DIAGNOSIS — E042 Nontoxic multinodular goiter: Principal | ICD-10-CM

## 2011-12-04 DIAGNOSIS — I798 Other disorders of arteries, arterioles and capillaries in diseases classified elsewhere: Secondary | ICD-10-CM | POA: Diagnosis not present

## 2011-12-04 DIAGNOSIS — D126 Benign neoplasm of colon, unspecified: Secondary | ICD-10-CM | POA: Diagnosis not present

## 2011-12-04 DIAGNOSIS — D34 Benign neoplasm of thyroid gland: Secondary | ICD-10-CM | POA: Diagnosis not present

## 2011-12-04 DIAGNOSIS — I10 Essential (primary) hypertension: Secondary | ICD-10-CM | POA: Diagnosis not present

## 2011-12-04 HISTORY — PX: THYROIDECTOMY: SHX17

## 2011-12-04 LAB — GLUCOSE, CAPILLARY
Glucose-Capillary: 131 mg/dL — ABNORMAL HIGH (ref 70–99)
Glucose-Capillary: 159 mg/dL — ABNORMAL HIGH (ref 70–99)

## 2011-12-04 SURGERY — THYROIDECTOMY
Anesthesia: General | Site: Neck | Wound class: Clean

## 2011-12-04 MED ORDER — LACTATED RINGERS IV SOLN
INTRAVENOUS | Status: DC | PRN
  Administered 2011-12-04 (×2): via INTRAVENOUS

## 2011-12-04 MED ORDER — CEFAZOLIN SODIUM 1-5 GM-% IV SOLN: 1.0000 g | INTRAVENOUS | Status: DC

## 2011-12-04 MED ORDER — PROMETHAZINE HCL 25 MG/ML IJ SOLN
6.2500 mg | INTRAMUSCULAR | Status: DC | PRN
  Administered 2011-12-04: 6.25 mg via INTRAVENOUS

## 2011-12-04 MED ORDER — HYDROMORPHONE HCL PF 1 MG/ML IJ SOLN: 0.2500 mg | INTRAMUSCULAR | Status: DC | PRN

## 2011-12-04 MED ORDER — ACETAMINOPHEN 500 MG PO TABS
500.0000 mg | ORAL_TABLET | Freq: Four times a day (QID) | ORAL | Status: DC | PRN
  Administered 2011-12-05: 500 mg via ORAL
  Filled 2011-12-04: qty 1

## 2011-12-04 MED ORDER — ROCURONIUM BROMIDE 100 MG/10ML IV SOLN
INTRAVENOUS | Status: DC | PRN
  Administered 2011-12-04: 40 mg via INTRAVENOUS
  Administered 2011-12-04: 10 mg via INTRAVENOUS

## 2011-12-04 MED ORDER — 0.9 % SODIUM CHLORIDE (POUR BTL) OPTIME
TOPICAL | Status: DC | PRN
  Administered 2011-12-04: 1000 mL

## 2011-12-04 MED ORDER — PROMETHAZINE HCL 25 MG/ML IJ SOLN: 12.5000 mg | Freq: Four times a day (QID) | INTRAMUSCULAR | Status: DC | PRN

## 2011-12-04 MED ORDER — LACTATED RINGERS IV SOLN
INTRAVENOUS | Status: DC
  Administered 2011-12-04: 10:00:00 via INTRAVENOUS

## 2011-12-04 MED ORDER — EPHEDRINE SULFATE 50 MG/ML IJ SOLN
INTRAMUSCULAR | Status: DC | PRN
  Administered 2011-12-04: 10 mg via INTRAVENOUS

## 2011-12-04 MED ORDER — GLYCOPYRROLATE 0.2 MG/ML IJ SOLN
INTRAMUSCULAR | Status: DC | PRN
  Administered 2011-12-04: .6 mg via INTRAVENOUS

## 2011-12-04 MED ORDER — PROPOFOL 10 MG/ML IV EMUL
INTRAVENOUS | Status: DC | PRN
  Administered 2011-12-04: 150 mg via INTRAVENOUS

## 2011-12-04 MED ORDER — DEXAMETHASONE SODIUM PHOSPHATE 10 MG/ML IJ SOLN
INTRAMUSCULAR | Status: DC | PRN
  Administered 2011-12-04: 10 mg via INTRAVENOUS

## 2011-12-04 MED ORDER — HEMOSTATIC AGENTS (NO CHARGE) OPTIME
TOPICAL | Status: DC | PRN
  Administered 2011-12-04: 1 via TOPICAL

## 2011-12-04 MED ORDER — OLMESARTAN MEDOXOMIL 20 MG PO TABS
20.0000 mg | ORAL_TABLET | Freq: Every day | ORAL | Status: DC
  Administered 2011-12-05: 20 mg via ORAL
  Filled 2011-12-04: qty 1

## 2011-12-04 MED ORDER — LABETALOL HCL 5 MG/ML IV SOLN
5.0000 mg | INTRAVENOUS | Status: DC | PRN
  Administered 2011-12-04: 5 mg via INTRAVENOUS
  Filled 2011-12-04: qty 4

## 2011-12-04 MED ORDER — CEFAZOLIN SODIUM 1-5 GM-% IV SOLN
INTRAVENOUS | Status: DC | PRN
  Administered 2011-12-04: 1 g via INTRAVENOUS

## 2011-12-04 MED ORDER — HYDROCODONE-ACETAMINOPHEN 5-325 MG PO TABS: 1.0000 | ORAL_TABLET | ORAL | Status: DC | PRN

## 2011-12-04 MED ORDER — HYDROCHLOROTHIAZIDE 25 MG PO TABS
25.0000 mg | ORAL_TABLET | Freq: Every day | ORAL | Status: DC
  Administered 2011-12-05: 25 mg via ORAL
  Filled 2011-12-04: qty 1

## 2011-12-04 MED ORDER — METOPROLOL SUCCINATE ER 100 MG PO TB24
100.0000 mg | ORAL_TABLET | Freq: Every day | ORAL | Status: DC
  Administered 2011-12-05: 100 mg via ORAL
  Filled 2011-12-04: qty 1

## 2011-12-04 MED ORDER — GLIPIZIDE ER 5 MG PO TB24
5.0000 mg | ORAL_TABLET | Freq: Every day | ORAL | Status: DC
  Administered 2011-12-05: 5 mg via ORAL
  Filled 2011-12-04: qty 1

## 2011-12-04 MED ORDER — NEOSTIGMINE METHYLSULFATE 1 MG/ML IJ SOLN
INTRAMUSCULAR | Status: DC | PRN
  Administered 2011-12-04: 4 mg via INTRAVENOUS

## 2011-12-04 MED ORDER — FENTANYL CITRATE 0.05 MG/ML IJ SOLN
INTRAMUSCULAR | Status: DC | PRN
  Administered 2011-12-04: 100 ug via INTRAVENOUS
  Administered 2011-12-04 (×2): 50 ug via INTRAVENOUS
  Administered 2011-12-04: 100 ug via INTRAVENOUS

## 2011-12-04 MED ORDER — HYDROMORPHONE HCL PF 1 MG/ML IJ SOLN
0.5000 mg | INTRAMUSCULAR | Status: DC | PRN
  Administered 2011-12-04 (×2): 0.5 mg via INTRAVENOUS
  Filled 2011-12-04: qty 1

## 2011-12-04 MED ORDER — METFORMIN HCL 850 MG PO TABS
850.0000 mg | ORAL_TABLET | Freq: Three times a day (TID) | ORAL | Status: DC
  Administered 2011-12-04 – 2011-12-05 (×2): 850 mg via ORAL
  Filled 2011-12-04 (×4): qty 1

## 2011-12-04 MED ORDER — CALCIUM CARBONATE 1250 (500 CA) MG PO TABS
1250.0000 mg | ORAL_TABLET | Freq: Three times a day (TID) | ORAL | Status: DC
  Administered 2011-12-04 (×2): 1250 mg via ORAL
  Filled 2011-12-04 (×5): qty 1

## 2011-12-04 MED ORDER — ACETAMINOPHEN 10 MG/ML IV SOLN
INTRAVENOUS | Status: DC | PRN
  Administered 2011-12-04: 1000 mg via INTRAVENOUS

## 2011-12-04 MED ORDER — HYDROMORPHONE HCL PF 1 MG/ML IJ SOLN
INTRAMUSCULAR | Status: AC
  Filled 2011-12-04: qty 1

## 2011-12-04 MED ORDER — ONDANSETRON HCL 4 MG/2ML IJ SOLN
INTRAMUSCULAR | Status: DC | PRN
  Administered 2011-12-04: 4 mg via INTRAVENOUS

## 2011-12-04 MED ORDER — VALSARTAN-HYDROCHLOROTHIAZIDE 160-25 MG PO TABS: 1.0000 | ORAL_TABLET | Freq: Every day | ORAL | Status: DC

## 2011-12-04 MED ORDER — FENTANYL CITRATE 0.05 MG/ML IJ SOLN
25.0000 ug | INTRAMUSCULAR | Status: DC | PRN
  Administered 2011-12-04 (×2): 25 ug via INTRAVENOUS

## 2011-12-04 SURGICAL SUPPLY — 43 items
ATTRACTOMAT 16X20 MAGNETIC DRP (DRAPES) ×2 IMPLANT
BENZOIN TINCTURE PRP APPL 2/3 (GAUZE/BANDAGES/DRESSINGS) ×2 IMPLANT
BLADE HEX COATED 2.75 (ELECTRODE) ×2 IMPLANT
BLADE SURG 15 STRL LF DISP TIS (BLADE) ×1 IMPLANT
BLADE SURG 15 STRL SS (BLADE) ×1
CANISTER SUCTION 2500CC (MISCELLANEOUS) ×2 IMPLANT
CHLORAPREP W/TINT 10.5 ML (MISCELLANEOUS) ×2 IMPLANT
CLIP TI MEDIUM 6 (CLIP) ×4 IMPLANT
CLIP TI WIDE RED SMALL 6 (CLIP) ×8 IMPLANT
CLOTH BEACON ORANGE TIMEOUT ST (SAFETY) ×2 IMPLANT
DISSECTOR ROUND CHERRY 3/8 STR (MISCELLANEOUS) IMPLANT
DRAPE PED LAPAROTOMY (DRAPES) ×2 IMPLANT
DRESSING SURGICEL FIBRLLR 1X2 (HEMOSTASIS) ×1 IMPLANT
DRSG SURGICEL FIBRILLAR 1X2 (HEMOSTASIS) ×2
ELECT REM PT RETURN 9FT ADLT (ELECTROSURGICAL) ×2
ELECTRODE REM PT RTRN 9FT ADLT (ELECTROSURGICAL) ×1 IMPLANT
GAUZE SPONGE 4X4 16PLY XRAY LF (GAUZE/BANDAGES/DRESSINGS) ×2 IMPLANT
GLOVE BIO SURGEON STRL SZ7 (GLOVE) ×2 IMPLANT
GLOVE BIOGEL PI IND STRL 7.0 (GLOVE) ×1 IMPLANT
GLOVE BIOGEL PI INDICATOR 7.0 (GLOVE) ×1
GLOVE SURG ORTHO 8.0 STRL STRW (GLOVE) ×2 IMPLANT
GLOVE SURG SIGNA 7.5 PF LTX (GLOVE) ×2 IMPLANT
GLOVE SURG SS PI 6.5 STRL IVOR (GLOVE) ×2 IMPLANT
GOWN STRL NON-REIN LRG LVL3 (GOWN DISPOSABLE) ×2 IMPLANT
GOWN STRL REIN XL XLG (GOWN DISPOSABLE) ×4 IMPLANT
KIT BASIN OR (CUSTOM PROCEDURE TRAY) ×2 IMPLANT
NS IRRIG 1000ML POUR BTL (IV SOLUTION) ×2 IMPLANT
PACK BASIC VI WITH GOWN DISP (CUSTOM PROCEDURE TRAY) ×2 IMPLANT
PENCIL BUTTON HOLSTER BLD 10FT (ELECTRODE) ×2 IMPLANT
SHEARS HARMONIC 9CM CVD (BLADE) ×2 IMPLANT
SPONGE GAUZE 4X4 12PLY (GAUZE/BANDAGES/DRESSINGS) ×2 IMPLANT
STAPLER VISISTAT 35W (STAPLE) ×2 IMPLANT
STRIP CLOSURE SKIN 1/2X4 (GAUZE/BANDAGES/DRESSINGS) ×2 IMPLANT
SUT MNCRL AB 4-0 PS2 18 (SUTURE) ×2 IMPLANT
SUT SILK 2 0 (SUTURE) ×1
SUT SILK 2-0 18XBRD TIE 12 (SUTURE) ×1 IMPLANT
SUT SILK 3 0 (SUTURE)
SUT SILK 3-0 18XBRD TIE 12 (SUTURE) IMPLANT
SUT VIC AB 3-0 SH 18 (SUTURE) ×4 IMPLANT
SYR BULB IRRIGATION 50ML (SYRINGE) ×2 IMPLANT
TAPE CLOTH SURG 4X10 WHT LF (GAUZE/BANDAGES/DRESSINGS) ×2 IMPLANT
TOWEL OR 17X26 10 PK STRL BLUE (TOWEL DISPOSABLE) ×2 IMPLANT
YANKAUER SUCT BULB TIP 10FT TU (MISCELLANEOUS) ×2 IMPLANT

## 2011-12-04 NOTE — Anesthesia Postprocedure Evaluation (Signed)
  Anesthesia Post-op Note  Patient: Alisha [name]  Procedure(s) Performed:  THYROIDECTOMY - total thyroidectomy  Patient Location: PACU  Anesthesia Type: General  Level of Consciousness: awake and alert   Airway and Oxygen Therapy: Patient Spontanous Breathing  Post-op Pain: mild  Post-op Assessment: Post-op Vital signs reviewed, Patient's Cardiovascular Status Stable, Respiratory Function Stable, Patent Airway and No signs of Nausea or vomiting  Post-op Vital Signs: stable  Complications: No apparent anesthesia complications

## 2011-12-04 NOTE — Brief Op Note (Signed)
12/04/2011  9:18 AM  PATIENT:  Lynford Citizen  78 y.o. female  PRE-OPERATIVE DIAGNOSIS:  Hurthle cell lesion, atypia, multinodular gland  POST-OPERATIVE DIAGNOSIS:  multinodular gland, atypia, rule out Hurthle cell neoplasm  PROCEDURE:  Total thyroidectomy  SURGEON:  Velora Heckler, MD, FACS  ASSISTANT:  Ovidio Kin, MD, FACS  ANESTHESIA:   general  EBL:  Total I/O In: 1000 [I.V.:1000] Out: -   BLOOD ADMINISTERED:none  DRAINS: none   LOCAL MEDICATIONS USED:  NONE  SPECIMEN:  Excision  DISPOSITION OF SPECIMEN:  PATHOLOGY  COUNTS:  YES  TOURNIQUET:  * No tourniquets in log *  DICTATION: .Other Dictation: Dictation Number [id number]  PLAN OF CARE: Admit for overnight observation  PATIENT DISPOSITION:  PACU - hemodynamically stable.   Velora Heckler, MD, FACS General & Endocrine Surgery [facility]

## 2011-12-04 NOTE — Anesthesia Procedure Notes (Signed)
Procedure Name: Intubation Date/Time: 12/04/2011 7:52 AM Performed by: [place], Asmara Backs C Pre-anesthesia Checklist: Patient identified, Timeout performed, Emergency Drugs available, Suction available and Patient being monitored Patient Re-evaluated:Patient Re-evaluated prior to inductionOxygen Delivery Method: Circle System Utilized Preoxygenation: Pre-oxygenation with 100% oxygen Intubation Type: IV induction Ventilation: Mask ventilation without difficulty Laryngoscope Size: Mac and 3 Grade View: Grade I Tube type: Oral Tube size: 7.5 mm Number of attempts: 1 Airway Equipment and Method: stylet Placement Confirmation: ETT inserted through vocal cords under direct vision,  breath sounds checked- equal and bilateral,  positive ETCO2 and CO2 detector Secured at: 20 cm Tube secured with: Tape Dental Injury: Teeth and Oropharynx as per pre-operative assessment

## 2011-12-04 NOTE — Op Note (Signed)
NAMELAVRA, IMLER NO.:  [id number]  MEDICAL RECORD NO.:  [id number]  LOCATION:  [facility]                         FACILITY:  [facility]  PHYSICIAN:  Velora Heckler, MD      DATE OF BIRTH:  1932-10-15  DATE OF PROCEDURE:  12/04/2011                               OPERATIVE REPORT   PREOPERATIVE DIAGNOSIS:  Multinodular thyroid with Hurthle cell change.  POSTOPERATIVE DIAGNOSIS:  Multinodular thyroid with Hurthle cell change.  PROCEDURE:  Total thyroidectomy.  SURGEON:  Velora Heckler, MD, FACS  ASSISTANT:  Sandria Bales. Ezzard Standing, MD, FACS  ANESTHESIA:  General per Dr. Sherrian Divers.  ESTIMATED BLOOD LOSS:  Minimal.  PREPARATION:  ChloraPrep.  COMPLICATIONS:  None.  INDICATIONS:  Patient is a 78 year old white female, recently diagnosed with multinodular thyroid on CT scan of the neck.  Dominant nodule was noted on the right.  Fine needle aspiration biopsy showed a follicular lesion with Hurthle cell changes, worrisome for Hurthle cell neoplasm. Patient now comes to Surgery for thyroidectomy.  BODY OF REPORT:  Procedure was done in OR #11 at the [facility].  Patient was brought to the operating room, placed in a supine position on the operating room table.  Following administration of general anesthesia, the patient was positioned and then prepped and draped in the usual strict aseptic fashion.  After ascertaining that an adequate level of anesthesia had been achieved, a Kocher incision was made with a #15 blade.  Dissection was carried through subcutaneous tissues and platysma.  Hemostasis was obtained with the electrocautery.  Skin flaps were elevated cephalad and caudad from the thyroid notch to the sternal notch.  A Mahorner self-retaining retractor was placed for exposure.  Strap muscles were incised in the midline and dissection was begun on the left.  Left thyroid lobe was small.  There was a dominant nodule in the inferior pole.  Lobe  was gently mobilized.  Superior pole vessels were divided between small and medium Ligaclips with the Harmonic scalpel.  Middle thyroid vein was divided between small Ligaclips with the Harmonic scalpel.  Inferior pole was gently mobilized with blunt dissection.  Branches of the inferior thyroid artery were divided between small Ligaclips with the Harmonic scalpel.  Parathyroid tissue was identified and preserved. Lobe was rolled anteriorly and the ligament of Allyson Sabal was released with the electrocautery.  Lobe was mobilized up and onto the anterior trachea.  Isthmus was mobilized off the trachea.  A small pyramidal lobe was resected with the isthmus.  Next, we turned our attention to the right thyroid lobe.  Strap muscles were reflected laterally.  Right lobe was markedly larger than the left. There was a dominant mass occupying the inferior pole of the right lobe. Superior pole vessels were dissected out and divided between small and medium Ligaclips with the Harmonic scalpel.  Branches of the inferior thyroid artery were divided between small Ligaclips with the Harmonic scalpel.  Inferior venous tributaries were divided between small and medium Ligaclips with the Harmonic scalpel.  Gland was rolled anteriorly.  Recurrent nerve was identified and preserved.  Ligament of Allyson Sabal was released with the electrocautery and the gland  was mobilized onto the anterior trachea from which it was completely excised with the electrocautery.  Specimen was marked with a suture in the right superior pole.  The entire thyroid gland was submitted to Pathology for review.  Neck was irrigated with warm saline and hemostasis was obtained with the electrocautery.  Surgicel was placed throughout the operative field. Strap muscles were reapproximated in the midline with interrupted 3-0 Vicryl sutures.  Platysma was closed with interrupted 3-0 Vicryl sutures.  Skin was closed with a running 4-0 Monocryl  subcuticular suture.  Wound was washed and dried and benzoin and Steri-Strips were applied.  Sterile dressings were applied.  Patient was awakened from anesthesia and brought to the recovery room.  The patient tolerated the procedure well.   Velora Heckler, MD, FACS     TMG/MEDQ  D:  12/04/2011  T:  12/04/2011  Job:  [id number]  cc:   Gretta Arab. Valentina Lucks, M.D. Fax: [phone]

## 2011-12-04 NOTE — Anesthesia Preprocedure Evaluation (Signed)
Anesthesia Evaluation  Patient identified by MRN, date of birth, ID band Patient awake    Reviewed: Allergy & Precautions, H&P , NPO status , Patient's Chart, lab work & pertinent test results  History of Anesthesia Complications (+) PONV  Airway Mallampati: II TM Distance: >3 FB Neck ROM: Full    Dental No notable dental hx.    Pulmonary neg pulmonary ROS,  clear to auscultation  Pulmonary exam normal       Cardiovascular hypertension, Pt. on medications and Pt. on home beta blockers neg cardio ROS Regular Normal    Neuro/Psych Negative Neurological ROS  Negative Psych ROS   GI/Hepatic negative GI ROS, Neg liver ROS,   Endo/Other  Negative Endocrine ROSDiabetes mellitus-, Type 2, Oral Hypoglycemic Agents  Renal/GU negative Renal ROS  Genitourinary negative   Musculoskeletal negative musculoskeletal ROS (+)   Abdominal   Peds negative pediatric ROS (+)  Hematology negative hematology ROS (+)   Anesthesia Other Findings   Reproductive/Obstetrics negative OB ROS                           Anesthesia Physical Anesthesia Plan  ASA: II  Anesthesia Plan: General   Post-op Pain Management:    Induction: Intravenous  Airway Management Planned: Oral ETT  Additional Equipment:   Intra-op Plan:   Post-operative Plan: Extubation in OR  Informed Consent: I have reviewed the patients History and Physical, chart, labs and discussed the procedure including the risks, benefits and alternatives for the proposed anesthesia with the patient or authorized representative who has indicated his/her understanding and acceptance.   Dental advisory given  Plan Discussed with: CRNA  Anesthesia Plan Comments:         Anesthesia Quick Evaluation

## 2011-12-04 NOTE — H&P (Signed)
Alisha [name] is an 78 y.o. female.    Chief Complaint: Multinodular goiter with Hurthle cell changes  HPI: Patient is a 78 year old white female referred by her primary care physician with a newly diagnosed multinodular thyroid. Patient had undergone a CT scan with an incidental finding of thyroid nodules. Patient subsequently underwent a thyroid ultrasound which showed bilateral thyroid nodules. There were dominant nodules on both the left and right sides. The right-sided nodule measured 23 mm. Ultrasound-guided fine-needle aspiration biopsy was obtained and showed oncocytic follicular cells. A Hurthle cell neoplasm could not be excluded. Patient is now referred to surgery for consideration for resection for definitive diagnosis.  Patient has no prior history of thyroid disease. She has never been on thyroid medication. She has had no prior head or neck surgery. There is no family history of thyroid disease. There is no family history of other endocrinopathy.   Past Medical History  Diagnosis Date  . Arthritis   . Anemia   . Hyperlipidemia   . B12 deficiency   . Osteopenia   . Colon polyps   . Multiple thyroid nodules   . PONV (postoperative nausea and vomiting)   . Peripheral vascular disease     DVT  2008 with ? anticoagulation  . Blood transfusion   . Diabetes mellitus   . Hypertension     followed by Dr Valentina Lucks EKG EPIC dated 8/12    Past Surgical History  Procedure Date  . Femur surgery 07/08/11    broken in 3 places   . Hemmorrhoidectomy 2008  . Fracture surgery     8/12 femur fx with rod and bolt placed  . Hernia repair 1945    History reviewed. No pertinent family history. Social History:  reports that she has never smoked. She has never used smokeless tobacco. She reports that she does not drink alcohol or use illicit drugs.  Allergies:  Allergies  Allergen Reactions  . Ace Inhibitors     Cough   . Evista (Raloxifene Hydrochloride)     cramps   . Zocor  (Simvastatin - High Dose)     Cramps     Medications Prior to Admission  Medication Dose Route Frequency Provider Last Rate Last Dose  . 0.9 % irrigation (POUR BTL)    PRN Velora Heckler, MD   1,000 mL at 12/04/11 0709  . ceFAZolin (ANCEF) IVPB 1 g/50 mL premix  1 g Intravenous 60 min Pre-Op Velora Heckler, MD      . hemostatic agents    PRN Velora Heckler, MD   1 application at 12/04/11 0709   Medications Prior to Admission  Medication Sig Dispense Refill  . aspirin 81 MG tablet Take 81 mg by mouth daily before breakfast.       . Calcium Carbonate-Vitamin D (CALCIUM + D PO) Take 1 tablet by mouth 2 (two) times daily.       . Ferrous Sulfate (IRON) 90 (18 FE) MG TABS Take 1 tablet by mouth daily.       Marland Kitchen GLIPIZIDE XL 5 MG 24 hr tablet Take 5 mg by mouth daily before breakfast.       . meloxicam (MOBIC) 15 MG tablet Take 15 mg by mouth daily with supper.       . metFORMIN (GLUCOPHAGE) 850 MG tablet Take 850 mg by mouth 3 (three) times daily.       . metoprolol (TOPROL-XL) 100 MG 24 hr tablet Take 100 mg by mouth daily  before breakfast.       . Multiple Vitamin (MULTIVITAMIN PO) Take 1 tablet by mouth daily.       . valsartan-hydrochlorothiazide (DIOVAN-HCT) 160-25 MG per tablet Take 1 tablet by mouth daily before breakfast.       . ZETIA 10 MG tablet Take 10 mg by mouth daily before breakfast.         Results for orders placed during the hospital encounter of 12/04/11 (from the past 48 hour(s))  GLUCOSE, CAPILLARY     Status: Abnormal   Collection Time   12/04/11  6:03 AM      Component Value Range Comment   Glucose-Capillary 131 (*) 70 - 99 (mg/dL)    No results found.  Review of Systems  Constitutional: Negative.   HENT: Negative.   Eyes: Negative.   Respiratory: Negative.   Cardiovascular: Negative.   Gastrointestinal: Negative.   Genitourinary: Negative.   Musculoskeletal: Positive for joint pain.  Skin: Negative.   Neurological: Negative.   Endo/Heme/Allergies: Negative.     Psychiatric/Behavioral: Negative.     Blood pressure 152/81, pulse 87, temperature 98 F (36.7 C), resp. rate 20, SpO2 97.00%. Physical Exam  Constitutional: She is oriented to person, place, and time. She appears well-developed and well-nourished.  HENT:  Head: Normocephalic and atraumatic.  Right Ear: External ear normal.  Left Ear: External ear normal.  Nose: Nose normal.  Mouth/Throat: Oropharynx is clear and moist.  Eyes: EOM are normal. Pupils are equal, round, and reactive to light.  Neck: Normal range of motion. Neck supple. Thyromegaly present.  Cardiovascular: Normal rate, regular rhythm, normal heart sounds and intact distal pulses.   Respiratory: Effort normal and breath sounds normal.  GI: Soft. Bowel sounds are normal.  Musculoskeletal: Normal range of motion.  Neurological: She is alert and oriented to person, place, and time. She has normal reflexes.  Skin: Skin is warm and dry.  Psychiatric: She has a normal mood and affect. Her behavior is normal. Judgment and thought content normal.     Assessment/Plan Multinodular thyroid goiter with Hurthle cell changes in the dominant nodule  Plan total thyroidectomy today.  The risks and benefits of the procedure have been discussed at length with the patient.  The patient understands the proposed procedure, potential alternative treatments, and the course of recovery to be expected.  All of the patient's questions have been answered at this time.  The patient wishes to proceed with surgery.  Velora Heckler, MD, FACS General & Endocrine Surgery [facility]   Lizet Kelso M 12/04/2011, 7:39 AM

## 2011-12-04 NOTE — Transfer of Care (Signed)
Immediate Anesthesia Transfer of Care Note  Patient: Alisha [name]  Procedure(s) Performed:  THYROIDECTOMY - total thyroidectomy  Patient Location: PACU  Anesthesia Type: General  Level of Consciousness: awake and oriented  Airway & Oxygen Therapy: Patient Spontanous Breathing and Patient connected to face mask oxygen  Post-op Assessment: Report given to PACU RN and Post -op Vital signs reviewed and stable  Post vital signs: Reviewed and stable Filed Vitals:   12/04/11 0518  BP: 152/81  Pulse: 87  Temp: 36.7 C  Resp: 20    Complications: No apparent anesthesia complications

## 2011-12-05 LAB — BASIC METABOLIC PANEL
BUN: 21 mg/dL (ref 6–23)
Chloride: 99 mEq/L (ref 96–112)
GFR calc Af Amer: 69 mL/min — ABNORMAL LOW (ref >90)
GFR calc non Af Amer: 59 mL/min — ABNORMAL LOW (ref >90)
Potassium: 3.9 mEq/L (ref 3.5–5.1)

## 2011-12-05 MED ORDER — CALCIUM CARBONATE-VITAMIN D 250-125 MG-UNIT PO TABS: 2.0000 | ORAL_TABLET | Freq: Three times a day (TID) | ORAL | Status: DC

## 2011-12-05 MED ORDER — TRAMADOL HCL 50 MG PO TABS: 50.0000 mg | ORAL_TABLET | Freq: Four times a day (QID) | ORAL | Status: AC | PRN

## 2011-12-05 MED ORDER — SYNTHROID 88 MCG PO TABS: 88.0000 ug | ORAL_TABLET | Freq: Every day | ORAL | Status: DC

## 2011-12-05 NOTE — Discharge Summary (Signed)
  Physician Discharge Summary [facility]  Patient ID: Alisha [name] MRN: [id number] DOB/AGE: Aug 17, 1932 78 y.o.  Admit date: 12/04/2011 Discharge date: 12/05/2011  Admission Diagnoses:  Thyroid nodule with Hurthle cell change  Discharge Diagnoses:  Active Problems:  * No active hospital problems. *    Discharged Condition: good  Hospital Course: Patient admitted after thyroidectomy.  Calcium level 9.1 on morning after surgery.  Pain well controlled.  Minimal swelling.  Prepared for discharge home on POD#1.  Consults: none  Significant Diagnostic Studies: none  Treatments: surgery: total thyroidectomy  Discharge Exam: Blood pressure 151/73, pulse 91, temperature 97.8 F (36.6 C), temperature source Oral, resp. rate 18, height 5\' 1"  (1.549 m), weight 154 lb 5.2 oz (70 kg), SpO2 97.00%. HEENT - normal Neck - min STS; voice normal; wound clear and dry  Disposition: Home with family  Discharge Orders    Future Appointments: Provider: Department: Dept Phone: Center:   12/19/2011 11:45 AM Velora Heckler, MD [facility] [phone] None     Current Discharge Medication List    CONTINUE these medications which have NOT CHANGED   Details  acetaminophen (TYLENOL) 500 MG tablet Take 500 mg by mouth every 6 (six) hours as needed. Pain     aspirin 81 MG tablet Take 81 mg by mouth daily before breakfast.     Calcium Carbonate-Vitamin D (CALCIUM + D PO) Take 1 tablet by mouth 2 (two) times daily.     Ferrous Sulfate (IRON) 90 (18 FE) MG TABS Take 1 tablet by mouth daily.     fish oil-omega-3 fatty acids 1000 MG capsule Take 1 g by mouth daily.     GLIPIZIDE XL 5 MG 24 hr tablet Take 5 mg by mouth daily before breakfast.     meloxicam (MOBIC) 15 MG tablet Take 15 mg by mouth daily with supper.     metFORMIN (GLUCOPHAGE) 850 MG tablet Take 850 mg by mouth 3 (three) times daily.     metoprolol (TOPROL-XL) 100 MG 24 hr tablet Take 100 mg by mouth daily  before breakfast.     Multiple Vitamin (MULTIVITAMIN PO) Take 1 tablet by mouth daily.     valsartan-hydrochlorothiazide (DIOVAN-HCT) 160-25 MG per tablet Take 1 tablet by mouth daily before breakfast.     ZETIA 10 MG tablet Take 10 mg by mouth daily before breakfast.        Velora Heckler, MD, FACS General & Endocrine Surgery [facility]   Signed: Velora Heckler 12/05/2011, 8:16 AM

## 2011-12-05 NOTE — Progress Notes (Signed)
She is awake, alert and in no distress.  Her neck area incision is completely approximated with steri-strips, and is without any discoloration or drng.  I take her to her vehicle per w/c without incident.

## 2011-12-06 NOTE — Progress Notes (Signed)
Quick Note:  Please contact patient with benign path results. TMG ______ 

## 2011-12-06 NOTE — Progress Notes (Signed)
Patient aware.

## 2011-12-07 ENCOUNTER — Encounter (HOSPITAL_COMMUNITY): Payer: Self-pay | Admitting: Surgery

## 2011-12-18 DIAGNOSIS — E119 Type 2 diabetes mellitus without complications: Secondary | ICD-10-CM | POA: Diagnosis not present

## 2011-12-18 DIAGNOSIS — E042 Nontoxic multinodular goiter: Secondary | ICD-10-CM | POA: Diagnosis not present

## 2011-12-18 DIAGNOSIS — I10 Essential (primary) hypertension: Secondary | ICD-10-CM | POA: Diagnosis not present

## 2011-12-18 DIAGNOSIS — D510 Vitamin B12 deficiency anemia due to intrinsic factor deficiency: Secondary | ICD-10-CM | POA: Diagnosis not present

## 2011-12-19 ENCOUNTER — Ambulatory Visit (INDEPENDENT_AMBULATORY_CARE_PROVIDER_SITE_OTHER): Payer: Medicare Other | Admitting: Surgery

## 2011-12-19 ENCOUNTER — Encounter (INDEPENDENT_AMBULATORY_CARE_PROVIDER_SITE_OTHER): Payer: Self-pay | Admitting: Surgery

## 2011-12-19 VITALS — BP 134/72 | HR 70 | Temp 97.6°F | Resp 18 | Ht 62.0 in | Wt 158.8 lb

## 2011-12-19 DIAGNOSIS — E042 Nontoxic multinodular goiter: Secondary | ICD-10-CM

## 2011-12-19 NOTE — Patient Instructions (Signed)
  COCOA BUTTER & VITAMIN E CREAM  ([employer] or other brand)  Apply cocoa butter/vitamin E cream to your incision 2 - 3 times daily.  Massage cream into incision for one minute with each application.  Use sunscreen (50 SPF or higher) for first 6 months after surgery if area is exposed to sun.  You may substitute Mederma or other scar reducing creams as desired.   

## 2011-12-19 NOTE — Progress Notes (Signed)
Visit Diagnoses: 1. Multinodular goiter (nontoxic), atypia on cytopathology     HISTORY: Patient returns for her first postoperative visit having undergone total thyroidectomy 2 weeks ago. Final pathology shows an oncocytic neoplasm measuring 2.5 cm in size. There is no atypia. It appears consistent with an adenoma.  EXAM: Incision appears well healed. Mild soft tissue swelling. No sign of seroma. No sign of infection.  IMPRESSION: Status post total thyroidectomy for oncocytic adenoma and follicular adenomas  PLAN: Patient is taking thyroid hormone replacement, Synthroid 88 mcg daily. She will begin applying topical creams to her incision. We will check her laboratory studies in 3 weeks to include a TSH level and serum calcium level.  Patient will return to see me for wound check in 6 weeks.  Velora Heckler, MD, FACS General & Endocrine Surgery [facility]

## 2012-01-07 DIAGNOSIS — E042 Nontoxic multinodular goiter: Secondary | ICD-10-CM | POA: Diagnosis not present

## 2012-01-15 DIAGNOSIS — Z1231 Encounter for screening mammogram for malignant neoplasm of breast: Secondary | ICD-10-CM | POA: Diagnosis not present

## 2012-01-25 DIAGNOSIS — M6281 Muscle weakness (generalized): Secondary | ICD-10-CM | POA: Diagnosis not present

## 2012-01-31 ENCOUNTER — Encounter (INDEPENDENT_AMBULATORY_CARE_PROVIDER_SITE_OTHER): Payer: Self-pay | Admitting: Surgery

## 2012-02-04 ENCOUNTER — Encounter (INDEPENDENT_AMBULATORY_CARE_PROVIDER_SITE_OTHER): Payer: Self-pay | Admitting: Surgery

## 2012-02-04 ENCOUNTER — Ambulatory Visit (INDEPENDENT_AMBULATORY_CARE_PROVIDER_SITE_OTHER): Payer: Medicare Other | Admitting: Surgery

## 2012-02-04 ENCOUNTER — Other Ambulatory Visit (INDEPENDENT_AMBULATORY_CARE_PROVIDER_SITE_OTHER): Payer: Self-pay | Admitting: Surgery

## 2012-02-04 VITALS — BP 146/88 | HR 98 | Temp 98.4°F | Resp 18 | Ht 62.0 in | Wt 158.2 lb

## 2012-02-04 DIAGNOSIS — E042 Nontoxic multinodular goiter: Secondary | ICD-10-CM

## 2012-02-04 DIAGNOSIS — E890 Postprocedural hypothyroidism: Secondary | ICD-10-CM

## 2012-02-04 DIAGNOSIS — E039 Hypothyroidism, unspecified: Secondary | ICD-10-CM

## 2012-02-04 NOTE — Progress Notes (Signed)
Visit Diagnoses: 1. Multinodular goiter (nontoxic), atypia on cytopathology   2. Hypothyroidism, postsurgical     HISTORY: The patient returns for her second postoperative visit. She complains of having no appetite and occasional nausea. She has a mild headache. Laboratory studies from January 07, 2012 show a normal serum calcium level is 9.3. However, the patient's TSH level was markedly elevated at 52.8. After discussion, the patient is taking her thyroid medication with all of her other medications including her calcium supplements in the morning.  EXAM: Surgical wound is well-healed. No soft tissue swelling. No palpable masses. Voice quality is normal.  IMPRESSION: Status post total thyroidectomy, postsurgical hypothyroidism due to inappropriate medication administration  PLAN: Patient and her son are instructed today on proper administration of Synthroid. She will begin taking this first thing in the morning on an empty stomach and not take any other type medication or food for 30 minutes. We will repeat her TSH level in 5 weeks. She will return to see me for final postoperative visit in 6 weeks.  Velora Heckler, MD, FACS General & Endocrine Surgery [facility]

## 2012-02-04 NOTE — Patient Instructions (Signed)
  COCOA BUTTER & VITAMIN E CREAM  ([employer] or other brand)  Apply cocoa butter/vitamin E cream to your incision 2 - 3 times daily.  Massage cream into incision for one minute with each application.  Use sunscreen (50 SPF or higher) for first 6 months after surgery if area is exposed to sun.  You may substitute Mederma or other scar reducing creams as desired.   

## 2012-03-07 DIAGNOSIS — R609 Edema, unspecified: Secondary | ICD-10-CM | POA: Diagnosis not present

## 2012-03-18 ENCOUNTER — Other Ambulatory Visit (INDEPENDENT_AMBULATORY_CARE_PROVIDER_SITE_OTHER): Payer: Self-pay | Admitting: Surgery

## 2012-03-18 DIAGNOSIS — E039 Hypothyroidism, unspecified: Secondary | ICD-10-CM | POA: Diagnosis not present

## 2012-03-18 LAB — TSH: TSH: 43.056 u[IU]/mL — ABNORMAL HIGH (ref 0.350–4.500)

## 2012-03-19 DIAGNOSIS — M810 Age-related osteoporosis without current pathological fracture: Secondary | ICD-10-CM | POA: Diagnosis not present

## 2012-03-19 DIAGNOSIS — E042 Nontoxic multinodular goiter: Secondary | ICD-10-CM | POA: Diagnosis not present

## 2012-03-19 DIAGNOSIS — I10 Essential (primary) hypertension: Secondary | ICD-10-CM | POA: Diagnosis not present

## 2012-03-19 DIAGNOSIS — E119 Type 2 diabetes mellitus without complications: Secondary | ICD-10-CM | POA: Diagnosis not present

## 2012-03-19 DIAGNOSIS — E7800 Pure hypercholesterolemia, unspecified: Secondary | ICD-10-CM | POA: Diagnosis not present

## 2012-03-19 DIAGNOSIS — D510 Vitamin B12 deficiency anemia due to intrinsic factor deficiency: Secondary | ICD-10-CM | POA: Diagnosis not present

## 2012-03-24 ENCOUNTER — Encounter (INDEPENDENT_AMBULATORY_CARE_PROVIDER_SITE_OTHER): Payer: Medicare Other | Admitting: Surgery

## 2012-04-01 ENCOUNTER — Encounter (INDEPENDENT_AMBULATORY_CARE_PROVIDER_SITE_OTHER): Payer: Self-pay | Admitting: Surgery

## 2012-04-01 ENCOUNTER — Ambulatory Visit (INDEPENDENT_AMBULATORY_CARE_PROVIDER_SITE_OTHER): Payer: Medicare Other | Admitting: Surgery

## 2012-04-01 VITALS — BP 152/100 | HR 78 | Temp 97.6°F | Resp 14 | Ht 62.0 in | Wt 157.0 lb

## 2012-04-01 DIAGNOSIS — E890 Postprocedural hypothyroidism: Secondary | ICD-10-CM | POA: Diagnosis not present

## 2012-04-01 DIAGNOSIS — E042 Nontoxic multinodular goiter: Secondary | ICD-10-CM

## 2012-04-01 MED ORDER — SYNTHROID 88 MCG PO TABS: 100.0000 ug | ORAL_TABLET | Freq: Every day | ORAL | Status: DC

## 2012-04-01 NOTE — Patient Instructions (Signed)
You should follow up with Dr. Valentina Lucks in about 6 weeks with lab work to check the level of your thyroid medication.  Velora Heckler, MD, [facility] Office: [phone]

## 2012-04-01 NOTE — Progress Notes (Signed)
Visit Diagnoses: 1. Hypothyroidism, postsurgical   2. Multinodular goiter (nontoxic), atypia on cytopathology     HISTORY: Patient returns for followup having undergone total thyroidectomy in January 2013. Patient is on Synthroid 88 mcg daily. TSH level on March 18, 2012 remains elevated at 43.056. Patient is now taking the medication correctly, early in the morning, on an empty stomach.  PERTINENT REVIEW OF SYSTEMS: Moderate fatigue. Denies tremor. Denies palpitations. Denies pain.  EXAM: HEENT: normocephalic; pupils equal and reactive; sclerae clear; dentition good; mucous membranes moist NECK:  No palpable masses in the thyroid bed; small suture fragment removed from an incision today; symmetric on extension; no palpable anterior or posterior cervical lymphadenopathy; no supraclavicular masses; no tenderness CHEST: clear to auscultation bilaterally without rales, rhonchi, or wheezes CARDIAC: regular rate and rhythm without significant murmur; peripheral pulses are full EXT:  non-tender without edema; no deformity NEURO: no gross focal deficits; no sign of tremor   IMPRESSION: #1 status post total thyroidectomy for thyroid nodules with atypia, no evidence of malignancy #2 post surgical hypothyroidism  PLAN: I am going to increase her thyroid dosage from 88 mcg daily to 100 mcg daily. I will asked her to followup with her primary care physician. She will need to have a TSH level checked in 6 weeks. Her primary care physician will then adjust her dosage appropriately.  Patient will return to see me in this office as needed.  Velora Heckler, MD, FACS General & Endocrine Surgery [facility]

## 2012-04-02 ENCOUNTER — Telehealth (INDEPENDENT_AMBULATORY_CARE_PROVIDER_SITE_OTHER): Payer: Self-pay

## 2012-04-02 NOTE — Telephone Encounter (Signed)
Pt notified that per Dr Gerrit Friends 04-01-2012 office note I have called in synthroid #30 one po QDay. Pt will call Dr Docia Chuck office to schedule her labs for TSH to be done in 6 weeks to monitor response to change in synthroid. Pt will call with any concerns.

## 2012-04-18 DIAGNOSIS — M169 Osteoarthritis of hip, unspecified: Secondary | ICD-10-CM | POA: Diagnosis not present

## 2012-04-27 ENCOUNTER — Other Ambulatory Visit (INDEPENDENT_AMBULATORY_CARE_PROVIDER_SITE_OTHER): Payer: Self-pay | Admitting: Surgery

## 2012-04-28 ENCOUNTER — Telehealth (INDEPENDENT_AMBULATORY_CARE_PROVIDER_SITE_OTHER): Payer: Self-pay

## 2012-04-28 NOTE — Telephone Encounter (Signed)
I spoke with pt via phone re: request for refill of synthroid. Dr Ardine Eng note states pt will have labs by Dr Massie Maroon to see if med needs to be adjusted and rx will be followed by Dr Valentina Lucks. Pt states she did not request refill. Must have come thru as auto refill. Pt has appt 6-11 with Dr Valentina Lucks and will have labs drawn. Pt aware rx to be filled by Dr Valentina Lucks pending lab results.

## 2012-04-29 DIAGNOSIS — E042 Nontoxic multinodular goiter: Secondary | ICD-10-CM | POA: Diagnosis not present

## 2012-06-23 DIAGNOSIS — D510 Vitamin B12 deficiency anemia due to intrinsic factor deficiency: Secondary | ICD-10-CM | POA: Diagnosis not present

## 2012-06-23 DIAGNOSIS — E042 Nontoxic multinodular goiter: Secondary | ICD-10-CM | POA: Diagnosis not present

## 2012-06-23 DIAGNOSIS — E119 Type 2 diabetes mellitus without complications: Secondary | ICD-10-CM | POA: Diagnosis not present

## 2012-06-23 DIAGNOSIS — D649 Anemia, unspecified: Secondary | ICD-10-CM | POA: Diagnosis not present

## 2012-06-27 DIAGNOSIS — K6389 Other specified diseases of intestine: Secondary | ICD-10-CM | POA: Diagnosis not present

## 2012-07-10 ENCOUNTER — Encounter (HOSPITAL_COMMUNITY): Payer: Self-pay

## 2012-07-25 DIAGNOSIS — I739 Peripheral vascular disease, unspecified: Secondary | ICD-10-CM | POA: Diagnosis not present

## 2012-08-11 DIAGNOSIS — E119 Type 2 diabetes mellitus without complications: Secondary | ICD-10-CM | POA: Diagnosis not present

## 2012-08-11 DIAGNOSIS — H2510 Age-related nuclear cataract, unspecified eye: Secondary | ICD-10-CM | POA: Diagnosis not present

## 2012-09-05 DIAGNOSIS — Z23 Encounter for immunization: Secondary | ICD-10-CM | POA: Diagnosis not present

## 2012-09-22 DIAGNOSIS — D510 Vitamin B12 deficiency anemia due to intrinsic factor deficiency: Secondary | ICD-10-CM | POA: Diagnosis not present

## 2012-09-22 DIAGNOSIS — M810 Age-related osteoporosis without current pathological fracture: Secondary | ICD-10-CM | POA: Diagnosis not present

## 2013-01-05 DIAGNOSIS — E7800 Pure hypercholesterolemia, unspecified: Secondary | ICD-10-CM | POA: Diagnosis not present

## 2013-01-05 DIAGNOSIS — M810 Age-related osteoporosis without current pathological fracture: Secondary | ICD-10-CM | POA: Diagnosis not present

## 2013-01-05 DIAGNOSIS — E119 Type 2 diabetes mellitus without complications: Secondary | ICD-10-CM | POA: Diagnosis not present

## 2013-01-05 DIAGNOSIS — I10 Essential (primary) hypertension: Secondary | ICD-10-CM | POA: Diagnosis not present

## 2013-01-05 DIAGNOSIS — E042 Nontoxic multinodular goiter: Secondary | ICD-10-CM | POA: Diagnosis not present

## 2013-01-05 DIAGNOSIS — Z0000 Encounter for general adult medical examination without abnormal findings: Secondary | ICD-10-CM | POA: Diagnosis not present

## 2013-01-05 DIAGNOSIS — D510 Vitamin B12 deficiency anemia due to intrinsic factor deficiency: Secondary | ICD-10-CM | POA: Diagnosis not present

## 2013-03-30 DIAGNOSIS — D510 Vitamin B12 deficiency anemia due to intrinsic factor deficiency: Secondary | ICD-10-CM | POA: Diagnosis not present

## 2013-03-30 DIAGNOSIS — E119 Type 2 diabetes mellitus without complications: Secondary | ICD-10-CM | POA: Diagnosis not present

## 2013-03-30 DIAGNOSIS — Z1231 Encounter for screening mammogram for malignant neoplasm of breast: Secondary | ICD-10-CM | POA: Diagnosis not present

## 2013-04-02 DIAGNOSIS — R928 Other abnormal and inconclusive findings on diagnostic imaging of breast: Secondary | ICD-10-CM | POA: Diagnosis not present

## 2013-05-04 DIAGNOSIS — E119 Type 2 diabetes mellitus without complications: Secondary | ICD-10-CM | POA: Diagnosis not present

## 2013-05-04 DIAGNOSIS — H26019 Infantile and juvenile cortical, lamellar, or zonular cataract, unspecified eye: Secondary | ICD-10-CM | POA: Diagnosis not present

## 2013-05-04 DIAGNOSIS — H2510 Age-related nuclear cataract, unspecified eye: Secondary | ICD-10-CM | POA: Diagnosis not present

## 2013-07-06 DIAGNOSIS — E119 Type 2 diabetes mellitus without complications: Secondary | ICD-10-CM | POA: Diagnosis not present

## 2013-07-06 DIAGNOSIS — E7800 Pure hypercholesterolemia, unspecified: Secondary | ICD-10-CM | POA: Diagnosis not present

## 2013-07-06 DIAGNOSIS — I10 Essential (primary) hypertension: Secondary | ICD-10-CM | POA: Diagnosis not present

## 2013-07-06 DIAGNOSIS — D510 Vitamin B12 deficiency anemia due to intrinsic factor deficiency: Secondary | ICD-10-CM | POA: Diagnosis not present

## 2013-07-06 DIAGNOSIS — E042 Nontoxic multinodular goiter: Secondary | ICD-10-CM | POA: Diagnosis not present

## 2013-09-29 DIAGNOSIS — R928 Other abnormal and inconclusive findings on diagnostic imaging of breast: Secondary | ICD-10-CM | POA: Diagnosis not present

## 2013-09-29 DIAGNOSIS — Z09 Encounter for follow-up examination after completed treatment for conditions other than malignant neoplasm: Secondary | ICD-10-CM | POA: Diagnosis not present

## 2013-10-06 DIAGNOSIS — Z23 Encounter for immunization: Secondary | ICD-10-CM | POA: Diagnosis not present

## 2013-10-06 DIAGNOSIS — D510 Vitamin B12 deficiency anemia due to intrinsic factor deficiency: Secondary | ICD-10-CM | POA: Diagnosis not present

## 2013-10-06 DIAGNOSIS — E039 Hypothyroidism, unspecified: Secondary | ICD-10-CM | POA: Diagnosis not present

## 2014-01-11 DIAGNOSIS — M810 Age-related osteoporosis without current pathological fracture: Secondary | ICD-10-CM | POA: Diagnosis not present

## 2014-01-11 DIAGNOSIS — I10 Essential (primary) hypertension: Secondary | ICD-10-CM | POA: Diagnosis not present

## 2014-01-11 DIAGNOSIS — D510 Vitamin B12 deficiency anemia due to intrinsic factor deficiency: Secondary | ICD-10-CM | POA: Diagnosis not present

## 2014-01-11 DIAGNOSIS — E1129 Type 2 diabetes mellitus with other diabetic kidney complication: Secondary | ICD-10-CM | POA: Diagnosis not present

## 2014-01-11 DIAGNOSIS — E7800 Pure hypercholesterolemia, unspecified: Secondary | ICD-10-CM | POA: Diagnosis not present

## 2014-01-11 DIAGNOSIS — Z23 Encounter for immunization: Secondary | ICD-10-CM | POA: Diagnosis not present

## 2014-01-11 DIAGNOSIS — Z0000 Encounter for general adult medical examination without abnormal findings: Secondary | ICD-10-CM | POA: Diagnosis not present

## 2014-01-11 DIAGNOSIS — N183 Chronic kidney disease, stage 3 unspecified: Secondary | ICD-10-CM | POA: Diagnosis not present

## 2014-01-11 DIAGNOSIS — E042 Nontoxic multinodular goiter: Secondary | ICD-10-CM | POA: Diagnosis not present

## 2014-04-13 DIAGNOSIS — D510 Vitamin B12 deficiency anemia due to intrinsic factor deficiency: Secondary | ICD-10-CM | POA: Diagnosis not present

## 2014-04-15 DIAGNOSIS — R928 Other abnormal and inconclusive findings on diagnostic imaging of breast: Secondary | ICD-10-CM | POA: Diagnosis not present

## 2014-04-21 ENCOUNTER — Other Ambulatory Visit: Payer: Self-pay | Admitting: Radiology

## 2014-04-21 DIAGNOSIS — D0590 Unspecified type of carcinoma in situ of unspecified breast: Secondary | ICD-10-CM | POA: Diagnosis not present

## 2014-04-21 DIAGNOSIS — Z7189 Other specified counseling: Secondary | ICD-10-CM | POA: Diagnosis not present

## 2014-04-22 ENCOUNTER — Other Ambulatory Visit: Payer: Self-pay | Admitting: Radiology

## 2014-04-22 DIAGNOSIS — D0510 Intraductal carcinoma in situ of unspecified breast: Secondary | ICD-10-CM

## 2014-04-26 ENCOUNTER — Telehealth: Payer: Self-pay | Admitting: *Deleted

## 2014-04-26 ENCOUNTER — Encounter: Payer: Self-pay | Admitting: *Deleted

## 2014-04-26 DIAGNOSIS — C50411 Malignant neoplasm of upper-outer quadrant of right female breast: Secondary | ICD-10-CM | POA: Insufficient documentation

## 2014-04-26 NOTE — Telephone Encounter (Signed)
Confirmed [facility] for 04/28/14 at 1230pm .  Instructions and contact information given.

## 2014-04-27 ENCOUNTER — Ambulatory Visit
Admission: RE | Admit: 2014-04-27 | Discharge: 2014-04-27 | Disposition: A | Discharge status: Home / Self Care | Payer: Medicare Other | Source: Ambulatory Visit | Attending: Radiology | Admitting: Radiology

## 2014-04-27 DIAGNOSIS — D0510 Intraductal carcinoma in situ of unspecified breast: Secondary | ICD-10-CM

## 2014-04-27 DIAGNOSIS — D0590 Unspecified type of carcinoma in situ of unspecified breast: Secondary | ICD-10-CM | POA: Diagnosis not present

## 2014-04-27 MED ORDER — GADOBENATE DIMEGLUMINE 529 MG/ML IV SOLN
6.0000 mL | Freq: Once | INTRAVENOUS | Status: AC | PRN
  Administered 2014-04-27: 6 mL via INTRAVENOUS

## 2014-04-28 ENCOUNTER — Ambulatory Visit
Admission: RE | Admit: 2014-04-28 | Discharge: 2014-04-28 | Disposition: A | Discharge status: Home / Self Care | Payer: Medicare Other | Source: Ambulatory Visit | Attending: Radiation Oncology | Admitting: Radiation Oncology

## 2014-04-28 ENCOUNTER — Encounter: Payer: Self-pay | Admitting: *Deleted

## 2014-04-28 ENCOUNTER — Ambulatory Visit (HOSPITAL_BASED_OUTPATIENT_CLINIC_OR_DEPARTMENT_OTHER): Payer: Medicare Other | Admitting: General Surgery

## 2014-04-28 ENCOUNTER — Ambulatory Visit: Payer: Medicare Other | Attending: General Surgery | Admitting: Physical Therapy

## 2014-04-28 DIAGNOSIS — C50411 Malignant neoplasm of upper-outer quadrant of right female breast: Secondary | ICD-10-CM

## 2014-04-28 DIAGNOSIS — Z9181 History of falling: Secondary | ICD-10-CM | POA: Insufficient documentation

## 2014-04-28 DIAGNOSIS — R293 Abnormal posture: Secondary | ICD-10-CM | POA: Insufficient documentation

## 2014-04-28 DIAGNOSIS — C50419 Malignant neoplasm of upper-outer quadrant of unspecified female breast: Secondary | ICD-10-CM

## 2014-04-28 DIAGNOSIS — C50919 Malignant neoplasm of unspecified site of unspecified female breast: Secondary | ICD-10-CM | POA: Insufficient documentation

## 2014-04-28 DIAGNOSIS — IMO0001 Reserved for inherently not codable concepts without codable children: Secondary | ICD-10-CM | POA: Diagnosis not present

## 2014-04-28 NOTE — Assessment & Plan Note (Addendum)
Patient does desire breast conservation if this is possible. We'll schedule her for a stereo guided biopsy of the posterior extent of her calcifications.  It this whole area is DCIS, she will need a mastectomy due to a breast size discrepancy with the DCIS. She would only have a small amount of normal breast tissue  if this area were all involved.  Of note, her DCIS is ER and PR negative. She would not need to see oncology or need anti-hormonal therapy if the area is all noninvasive. Due to the high-grade nature of this I would perform a sentinel lymph node biopsy as there is a significant risk of having invasive cancer in the lesion.  We discussed lumpectomy and sentinel lymph node biopsy. I reviewed the risks and benefits of surgery. I advised the patient that if she needs a mastectomy then I will have her come back to further discuss this. I did review that a lumpectomy as an outpatient surgery and a mastectomy requires overnight stay with a drain for several weeks.  I discussed the risk of bleeding, infection, need for additional surgeries, possible cardiac or pulmonary complications, blood clots, etc. I also discussed that even if we were able to start with a lumpectomy, but sometimes we get positive margins that requires additional surgery. I discussed that sometimes we cannot clear these margins and end up having to do a mastectomy anyway. She will have her final recommendation regarding radiation after her final pathology is back. She is now recommended to get up front reconstruction with a mastectomy due to her pathology.   This was discussed with her and her son.  45 min spent in consultation, counseling, evaluation, examination, and coordination of care.  >50% spent in counseling.

## 2014-04-28 NOTE — Progress Notes (Signed)
Chief complaint:  New right breast DCIS  HISTORY: The patient is an 80 year old female who is referred by Dr. Laurann Montana for evaluation of a new right breast cancer. She presented with 6 month followup of calcifications. The calcifications were more concerning and measured 5.3 cm in diameter. She underwent biopsy and was found to have high-grade DCIS. She had an MRI which was concerning for a similar area centrally located. The area of biopsy was of the anterior aspect of the calcifications. She has not had prior surgeries on her breasts. She does not have any family history of breast or of any cancer or other cancer. She had menarche at age 32. She is no longer having periods and underwent menopause around 27 years ago. She did not use hormone replacement or terminal contraception. She had one child at age 52. She is up-to-date on her colonoscopy and bone density study.  Past Medical History  Diagnosis Date  . Arthritis   . Anemia   . Hyperlipidemia   . B12 deficiency   . Osteopenia   . Colon polyps   . Multiple thyroid nodules   . PONV (postoperative nausea and vomiting)   . Peripheral vascular disease     DVT  2008 with ? anticoagulation  . Blood transfusion   . Diabetes mellitus   . Hypertension     followed by Dr Laurann Montana EKG EPIC dated 8/12    Past Surgical History  Procedure Laterality Date  . Femur surgery  07/08/11    broken in 3 places   . Hemmorrhoidectomy  2008  . Fracture surgery      8/12 femur fx with rod and bolt placed  . Hernia repair  1945  . Thyroidectomy  12/04/2011    Procedure: THYROIDECTOMY;  Surgeon: Earnstine Regal, MD;  Location: [facility];  Service: General;  Laterality: N/A;  total thyroidectomy    Current Outpatient Prescriptions  Medication Sig Dispense Refill  . acetaminophen (TYLENOL) 500 MG tablet Take 500 mg by mouth every 6 (six) hours as needed. Pain       . aspirin 81 MG tablet Take 81 mg by mouth daily before breakfast.       . calcium-vitamin D  (CALCIUM + D) 250-125 MG-UNIT per tablet Take 2 tablets by mouth 3 (three) times daily.  60 tablet  2  . Ferrous Sulfate (IRON) 90 (18 FE) MG TABS Take 1 tablet by mouth daily.       . fish oil-omega-3 fatty acids 1000 MG capsule Take 1 g by mouth daily.       . furosemide (LASIX) 20 MG tablet 20 mg Daily.      Marland Kitchen GLIPIZIDE XL 5 MG 24 hr tablet Take 5 mg by mouth daily before breakfast.       . meloxicam (MOBIC) 15 MG tablet Take 15 mg by mouth daily with supper.       . metFORMIN (GLUCOPHAGE) 850 MG tablet Take 850 mg by mouth 3 (three) times daily.       . metoprolol (TOPROL-XL) 100 MG 24 hr tablet Take 100 mg by mouth daily before breakfast.       . Multiple Vitamin (MULTIVITAMIN PO) Take 1 tablet by mouth daily.       Marland Kitchen SYNTHROID 88 MCG tablet Take 1 tablet (88 mcg total) by mouth daily.  30 tablet  3  . valsartan-hydrochlorothiazide (DIOVAN-HCT) 160-25 MG per tablet Take 1 tablet by mouth daily before breakfast.       .  ZETIA 10 MG tablet Take 10 mg by mouth daily before breakfast.        No current facility-administered medications for this visit.     Allergies  Allergen Reactions  . Ace Inhibitors     Cough   . Evista [Raloxifene Hydrochloride]     cramps   . Zocor [Simvastatin - High Dose]     Cramps      No family history on file.   History   Social History  . Marital Status: Married    Spouse Name: N/A    Number of Children: N/A  . Years of Education: N/A   Social History Main Topics  . Smoking status: Never Smoker   . Smokeless tobacco: Never Used  . Alcohol Use: No  . Drug Use: No  . Sexual Activity: Not on file   Other Topics Concern  . Not on file   Social History Narrative  . No narrative on file     REVIEW OF SYSTEMS - PERTINENT POSITIVES ONLY: 12 point review of systems negative other than HPI and PMH  EXAM: Wt Readings from Last 3 Encounters:  04/28/14 151 lb 4.8 oz (68.629 kg)  04/01/12 157 lb (71.215 kg)  02/04/12 158 lb 3.2 oz (71.759  kg)   Temp Readings from Last 3 Encounters:  04/28/14 98.6 F (37 C)   04/01/12 97.6 F (36.4 C) Temporal  02/04/12 98.4 F (36.9 C) Temporal   BP Readings from Last 3 Encounters:  04/28/14 151/72  04/01/12 152/100  02/04/12 146/88   Pulse Readings from Last 3 Encounters:  04/28/14 92  04/01/12 78  02/04/12 98     Wt Readings from Last 3 Encounters:  04/28/14 151 lb 4.8 oz (68.629 kg)  04/01/12 157 lb (71.215 kg)  02/04/12 158 lb 3.2 oz (71.759 kg)     Gen:  No acute distress.  Well nourished and well groomed.   Neurological: Alert and oriented to person, place, and time. Coordination normal.  Head: Normocephalic and atraumatic.  Eyes: Conjunctivae are normal. Pupils are equal, round, and reactive to light. No scleral icterus.  Neck: Normal range of motion. Neck supple. No tracheal deviation or thyromegaly present.  Cardiovascular: Normal rate, regular rhythm, normal heart sounds and intact distal pulses.  Exam reveals no gallop and no friction rub.  No murmur heard. Breast: hematoma at 9 o'clock on right breast.  No palpable masses, skin dimpling, nipple retraction, lymphadenopathy.   Respiratory: Effort normal.  No respiratory distress. No chest wall tenderness. Breath sounds normal.  No wheezes, rales or rhonchi.  GI: Soft. Bowel sounds are normal. The abdomen is soft and nontender.  There is no rebound and no guarding.  Musculoskeletal: Normal range of motion. Extremities are nontender.  Lymphadenopathy: No cervical, preauricular, postauricular or axillary adenopathy is present Skin: Skin is warm and dry. No rash noted. No diaphoresis. No erythema. No pallor. No clubbing, cyanosis, or edema.   Psychiatric: Normal mood and affect. Behavior is normal. Judgment and thought content normal.    LABORATORY RESULTS: Available labs are reviewed   Diagnosis Breast, right, needle core biopsy - DUCTAL CARCINOMA IN SITU WITH CALCIFICATIONS. - SEE COMMENT.  ER/PR  -  RADIOLOGY RESULTS: See E-Chart or I-Site for most recent results.  Images and reports are reviewed.  Mr Breast Bilateral W Wo Contrast  04/27/2014   CLINICAL DATA:  Recently diagnosed high-grade right breast DCIS. Preoperative evaluation.  LABS:  BUN and creatinine were obtained on site at [facility]  at  [address]  Results:  BUN 17 mg/dL,  Creatinine 1.3 mg/dL.  EXAM: BILATERAL BREAST MRI WITH AND WITHOUT CONTRAST  TECHNIQUE: Multiplanar, multisequence MR images of both breasts were obtained prior to and following the intravenous administration of 30ml of MultiHance  THREE-DIMENSIONAL MR IMAGE RENDERING ON INDEPENDENT WORKSTATION:  Three-dimensional MR images were rendered by post-processing of the original MR data on an independent workstation. The three-dimensional MR images were interpreted, and findings are reported in the following complete MRI report for this study. Three dimensional images were evaluated at the independent DynaCad workstation  COMPARISON:  Previous mammograms from [place] dated 04/15/2014 and 03/30/2013.  FINDINGS: Breast composition: b.  Scattered fibroglandular tissue.  Background parenchymal enhancement: Moderate  Right breast: There is clumped linear and nodular enhancement located within the lateral portion of the right breast extending into the upper-outer quadrant. This measures 4.0 x 5.0 x 3.0 cm in size and correlates with the distribution of the calcifications present within the upper outer quadrant of the right breast on mammography. This enhancement is worrisome for DCIS. There is a signal void associated with the patient's stereotactic biopsy clip with surrounding hematoma measuring approximately 3 cm in size. The biopsy clip is associated with the anterior aspect of the area of enhancement. There are no additional findings within the right breast.  Left breast: No mass or abnormal enhancement.  Lymph nodes: No abnormal appearing lymph nodes.   Ancillary findings:  None.  IMPRESSION: Clumped linear and nodular enhancement located within the lateral portion right breast extending into the upper-outer quadrant which spans 5.0 x 4.0 x 3.0 cm in size and does correlate with the distribution of calcifications seen in this region on mammography. The stereotactic biopsy clip is associated with the anterior extent of this enhancement. If breast conservation is planned, stereotactic biopsy of the posterior extent of calcifications is recommended. No evidence for adenopathy or additional findings.  RECOMMENDATION: Treatment plan.  BI-RADS CATEGORY  6: Known biopsy-proven malignancy.   Electronically Signed   By: Luberta Robertson M.D.   On: 04/27/2014 11:30      ASSESSMENT AND PLAN: Breast cancer of upper-outer quadrant of right female breast Patient does desire breast conservation if this is possible. We'll schedule her for a stereo guided biopsy of the posterior extent of her calcifications.  It this whole area is DCIS, she will need a mastectomy due to a breast size discrepancy with the DCIS. She would only have a small amount of normal breast tissue  if this area were all involved.  Of note, her DCIS is ER and PR negative. She would not need to see oncology or need anti-hormonal therapy if the area is all noninvasive. Due to the high-grade nature of this I would perform a sentinel lymph node biopsy as there is a significant risk of having invasive cancer in the lesion.  We discussed lumpectomy and sentinel lymph node biopsy. I reviewed the risks and benefits of surgery. I advised the patient that if she needs a mastectomy then I will have her come back to further discuss this. I did review that a lumpectomy as an outpatient surgery and a mastectomy requires overnight stay with a drain for several weeks.  I discussed the risk of bleeding, infection, need for additional surgeries, possible cardiac or pulmonary complications, blood clots, etc. I also  discussed that even if we were able to start with a lumpectomy, but sometimes we get positive margins that requires additional surgery. I  discussed that sometimes we cannot clear these margins and end up having to do a mastectomy anyway. She will have her final recommendation regarding radiation after her final pathology is back. She is now recommended to get up front reconstruction with a mastectomy due to her pathology.   This was discussed with her and her son.  45 min spent in consultation, counseling, evaluation, examination, and coordination of care.  >50% spent in counseling.         Milus Height MD [facility]      Visit Diagnoses: 1. Breast cancer of upper-outer quadrant of right female breast     Primary Care Physician: Osborne Casco, MD

## 2014-04-29 ENCOUNTER — Encounter: Payer: Self-pay | Admitting: *Deleted

## 2014-04-29 NOTE — Progress Notes (Signed)
[place] Psychosocial Distress Screening Clinical Social Work  Clinical Social Work was referred by distress screening protocol.  The patient scored a 7 on the Psychosocial Distress Thermometer which indicates severe distress. Clinical Social Worker met with pt at [facility] to assess for distress and other psychosocial needs.   ONCBCN DISTRESS SCREENING 04/28/2014  Screening Type Initial Screening  Elta [place] the number that describes how much distress you have been experiencing in the past week 7  Emotional problem type Nervousness/Anxiety  Information Concerns Type Lack of info about diagnosis;Lack of info about treatment;Lack of info about complementary therapy choices;Lack of info about maintaining fitness  Physical Problem type Getting around;Swollen arms/legs  Referral to clinical social work Yes   CSW met with pt and her son, Aaron Edelman at [facility] to check in, introduce self, explain CSW role and review distress screen. Pt reports to have less anxiety after meeting with the medical team and having a better understanding of her diagnosis. CSW provided pt with information about the Pt and Family Support Team and resources to assist at the [facility]. CSW also discussed emotions common to newly diagnosed patients. Pt feels her anxiety has reduced to a 2 or 3 currently. She agrees to seek out additional assistance as needed and was appreciative of the visit today.   Clinical Social Worker follow up needed: no  Loren Racer, [place] Social Worker [address] for [place] Wednesday, Thursday and Friday Phone: [phone] Fax: [phone]

## 2014-04-30 ENCOUNTER — Telehealth (INDEPENDENT_AMBULATORY_CARE_PROVIDER_SITE_OTHER): Payer: Self-pay

## 2014-04-30 NOTE — Progress Notes (Signed)
Radiation Oncology         [phone]) [phone] ________________________________  Initial outpatient Consultation - Date: 04/28/2014   Name: Alisha [name] MRN: [id number]   DOB: 1932/04/24  REFERRING PHYSICIAN: Stark Klein, MD  STAGE: Breast cancer of upper-outer quadrant of right female breast   Primary site: Breast (Right)   Staging method: AJCC 7th Edition   Clinical: Stage 0 (Tis (DCIS), N0, cM0)   Summary: Stage 0 (Tis (DCIS), N0, cM0)   Clinical comments: Staged at breast conference 04/28/14  HISTORY OF PRESENT ILLNESS::Alisha [name] is a 80 y.o. female  Who presented for 6 month follow up for calcifications. She was found to have 2 groups of calcifications about 5 cm apart. The more worrisome group was biopsied and found to be highgrade DCIS (ER-PR-). MRI showed linear and nodular enhancement between these 2 areas concerning for DCIS. The biopsy clip was at the anterior aspect of this. A seond biopsy was recommended if the patient was interested in breast conservation. She has some soreness of her breast. She is accompanied by her son. She has no history of breast cancer. She had menarche at age 43. She is no longer having periods and underwent menopause around 27 years ago. She did not use hormone replacement. She had one child at age 65.She does use her right arm to walk with a cane and problems with balance if she does not use her cane for support.   PREVIOUS RADIATION THERAPY: No  PAST MEDICAL HISTORY:  has a past medical history of Arthritis; Anemia; Hyperlipidemia; B12 deficiency; Osteopenia; Colon polyps; Multiple thyroid nodules; PONV (postoperative nausea and vomiting); Peripheral vascular disease; Blood transfusion; Diabetes mellitus; and Hypertension.    PAST SURGICAL HISTORY: Past Surgical History  Procedure Laterality Date  . Femur surgery  07/08/11    broken in 3 places   . Hemmorrhoidectomy  2008  . Fracture surgery      8/12 femur fx with rod and bolt placed  . Hernia  repair  1945  . Thyroidectomy  12/04/2011    Procedure: THYROIDECTOMY;  Surgeon: Earnstine Regal, MD;  Location: [facility];  Service: General;  Laterality: N/A;  total thyroidectomy    FAMILY HISTORY: No family history on file.  SOCIAL HISTORY:  History  Substance Use Topics  . Smoking status: Never Smoker   . Smokeless tobacco: Never Used  . Alcohol Use: No    ALLERGIES: Ace inhibitors; Evista; and Zocor  MEDICATIONS:  Current Outpatient Prescriptions  Medication Sig Dispense Refill  . acetaminophen (TYLENOL) 500 MG tablet Take 500 mg by mouth every 6 (six) hours as needed. Pain       . aspirin 81 MG tablet Take 81 mg by mouth daily before breakfast.       . calcium-vitamin D (CALCIUM + D) 250-125 MG-UNIT per tablet Take 2 tablets by mouth 3 (three) times daily.  60 tablet  2  . Ferrous Sulfate (IRON) 90 (18 FE) MG TABS Take 1 tablet by mouth daily.       . fish oil-omega-3 fatty acids 1000 MG capsule Take 1 g by mouth daily.       . furosemide (LASIX) 20 MG tablet 20 mg Daily.      Marland Kitchen GLIPIZIDE XL 5 MG 24 hr tablet Take 5 mg by mouth daily before breakfast.       . meloxicam (MOBIC) 15 MG tablet Take 15 mg by mouth daily with supper.       . metFORMIN (  GLUCOPHAGE) 850 MG tablet Take 850 mg by mouth 3 (three) times daily.       . metoprolol (TOPROL-XL) 100 MG 24 hr tablet Take 100 mg by mouth daily before breakfast.       . Multiple Vitamin (MULTIVITAMIN PO) Take 1 tablet by mouth daily.       Marland Kitchen SYNTHROID 88 MCG tablet Take 1 tablet (88 mcg total) by mouth daily.  30 tablet  3  . valsartan-hydrochlorothiazide (DIOVAN-HCT) 160-25 MG per tablet Take 1 tablet by mouth daily before breakfast.       . ZETIA 10 MG tablet Take 10 mg by mouth daily before breakfast.        No current facility-administered medications for this encounter.    REVIEW OF SYSTEMS:  A 15 point review of systems is documented in the electronic medical record. This was obtained by the nursing staff. However, I  reviewed this with the patient to discuss relevant findings and make appropriate changes.  Pertinent items are noted in HPI.  PHYSICAL EXAM:  Pleasant female. Appears her stated age. Bruising along the right breast with a palpable hematoma. No palpable supraclavicular or cervical adenopathy.   LABORATORY DATA:  Lab Results  Component Value Date   WBC 10.9* 11/28/2011   HGB 11.9* 11/28/2011   HCT 36.8 11/28/2011   MCV 86.8 11/28/2011   PLT 312 11/28/2011   Lab Results  Component Value Date   NA 138 12/05/2011   K 3.9 12/05/2011   CL 99 12/05/2011   CO2 29 12/05/2011   Lab Results  Component Value Date   ALT 16 07/08/2011   AST 17 07/08/2011   ALKPHOS 45 07/08/2011   BILITOT 0.3 07/08/2011     RADIOGRAPHY: Mr Breast Bilateral W Wo Contrast  04/27/2014   CLINICAL DATA:  Recently diagnosed high-grade right breast DCIS. Preoperative evaluation.  LABS:  BUN and creatinine were obtained on site at [facility] at  [address]  Results:  BUN 17 mg/dL,  Creatinine 1.3 mg/dL.  EXAM: BILATERAL BREAST MRI WITH AND WITHOUT CONTRAST  TECHNIQUE: Multiplanar, multisequence MR images of both breasts were obtained prior to and following the intravenous administration of 50ml of MultiHance  THREE-DIMENSIONAL MR IMAGE RENDERING ON INDEPENDENT WORKSTATION:  Three-dimensional MR images were rendered by post-processing of the original MR data on an independent workstation. The three-dimensional MR images were interpreted, and findings are reported in the following complete MRI report for this study. Three dimensional images were evaluated at the independent DynaCad workstation  COMPARISON:  Previous mammograms from [facility] dated 04/15/2014 and 03/30/2013.  FINDINGS: Breast composition: b.  Scattered fibroglandular tissue.  Background parenchymal enhancement: Moderate  Right breast: There is clumped linear and nodular enhancement located within the lateral portion of the right breast extending into the  upper-outer quadrant. This measures 4.0 x 5.0 x 3.0 cm in size and correlates with the distribution of the calcifications present within the upper outer quadrant of the right breast on mammography. This enhancement is worrisome for DCIS. There is a signal void associated with the patient's stereotactic biopsy clip with surrounding hematoma measuring approximately 3 cm in size. The biopsy clip is associated with the anterior aspect of the area of enhancement. There are no additional findings within the right breast.  Left breast: No mass or abnormal enhancement.  Lymph nodes: No abnormal appearing lymph nodes.  Ancillary findings:  None.  IMPRESSION: Clumped linear and nodular enhancement located within the lateral portion right breast extending  into the upper-outer quadrant which spans 5.0 x 4.0 x 3.0 cm in size and does correlate with the distribution of calcifications seen in this region on mammography. The stereotactic biopsy clip is associated with the anterior extent of this enhancement. If breast conservation is planned, stereotactic biopsy of the posterior extent of calcifications is recommended. No evidence for adenopathy or additional findings.  RECOMMENDATION: Treatment plan.  BI-RADS CATEGORY  6: Known biopsy-proven malignancy.   Electronically Signed   By: Luberta Robertson M.D.   On: 04/27/2014 11:30     IMPRESSION: DCIS of the right breast.   PLAN: I spoke to the patient today regarding her diagnosis and options for treatment. We discussed that she has non-invasive curable disease. We discussed that she needed another biopsy and if this biopsy was positive, she would require a mastectomy given the 3 lesions would need to be removed and she would have a poor cosmetic outcome with the amount of tissue which would need to be removed in relation to the size of her breasts. We discussed that if the biopsy was negative, she would be a candidate for 2 lumpectomies. We discussed the equivalence in terms of  survival and local failure between mastectomy and breast conservation. We discussed the role of radiation in decreasing local failures in patients who undergo lumpectomy. We discussed the process of simulation and the placement tattoos. We discussed 4-6 weeks of treatment as an outpatient. We discussed the possibility of asymptomatic lung damage. We discussed the low likelihood of secondary malignancies. We discussed the possible side effects including but not limited to skin redness, fatigue, permanent skin darkening, and breast swelling. She would not need evaluation by medical oncology at this point as she is ER-. If her final pathology is ER+ she will require a referral.  I spent 40 minutes face to face with the patient and more than 50% of that time was spent in counseling and/or coordination of care.   ------------------------------------------------  Thea Silversmith, MD

## 2014-04-30 NOTE — Telephone Encounter (Signed)
Janus Molder, POA was calling to make sure that when Dr Harlow Asa had done his mother's thyroidectomy in 2013 there was no CA. Aaron Edelman states that his mother was thinking that she had thyroid CA. Informed pt that per Dr Gala Lewandowsky note on 04/01/12, he states  thyroid nodules with atypia(abnormal cells), no evidence of malignancy. Aaron Edelman verbalized understanding.

## 2014-05-03 ENCOUNTER — Telehealth: Payer: Self-pay | Admitting: *Deleted

## 2014-05-03 ENCOUNTER — Other Ambulatory Visit: Payer: Self-pay | Admitting: Radiology

## 2014-05-03 DIAGNOSIS — Z0000 Encounter for general adult medical examination without abnormal findings: Secondary | ICD-10-CM | POA: Diagnosis not present

## 2014-05-03 DIAGNOSIS — D0590 Unspecified type of carcinoma in situ of unspecified breast: Secondary | ICD-10-CM | POA: Diagnosis not present

## 2014-05-03 NOTE — Telephone Encounter (Signed)
Spoke to pt concerning [place] from 04/28/14.  Pt denies questions or concerns regarding dx or treatment care plan.  Pt was able to get 2nd stereotactic bx today.  Informed pt results will be back either on Tuesday or Wednesday.  Encourage pt to call with needs. Received verbal understanding. Contact information given.

## 2014-05-04 ENCOUNTER — Other Ambulatory Visit (INDEPENDENT_AMBULATORY_CARE_PROVIDER_SITE_OTHER): Payer: Self-pay | Admitting: General Surgery

## 2014-05-04 ENCOUNTER — Telehealth (INDEPENDENT_AMBULATORY_CARE_PROVIDER_SITE_OTHER): Payer: Self-pay | Admitting: General Surgery

## 2014-05-04 NOTE — Telephone Encounter (Signed)
Discussed pathology with patient.  Will get on schedule for right mastectomy with SLN bx.

## 2014-05-07 ENCOUNTER — Other Ambulatory Visit (INDEPENDENT_AMBULATORY_CARE_PROVIDER_SITE_OTHER): Payer: Self-pay | Admitting: General Surgery

## 2014-05-07 DIAGNOSIS — C50911 Malignant neoplasm of unspecified site of right female breast: Secondary | ICD-10-CM

## 2014-05-11 ENCOUNTER — Telehealth (INDEPENDENT_AMBULATORY_CARE_PROVIDER_SITE_OTHER): Payer: Self-pay | Admitting: General Surgery

## 2014-05-11 ENCOUNTER — Encounter (HOSPITAL_COMMUNITY)
Admission: RE | Admit: 2014-05-11 | Discharge: 2014-05-11 | Disposition: A | Discharge status: Home / Self Care | Payer: Medicare Other | Source: Ambulatory Visit | Attending: General Surgery | Admitting: General Surgery

## 2014-05-11 ENCOUNTER — Encounter (HOSPITAL_COMMUNITY): Payer: Self-pay

## 2014-05-11 ENCOUNTER — Ambulatory Visit (HOSPITAL_COMMUNITY)
Admission: RE | Admit: 2014-05-11 | Discharge: 2014-05-11 | Disposition: A | Discharge status: Home / Self Care | Payer: Medicare Other | Source: Ambulatory Visit | Attending: Anesthesiology | Admitting: Anesthesiology

## 2014-05-11 DIAGNOSIS — Z860100 Personal history of colon polyps, unspecified: Secondary | ICD-10-CM | POA: Diagnosis not present

## 2014-05-11 DIAGNOSIS — M1990 Unspecified osteoarthritis, unspecified site: Secondary | ICD-10-CM | POA: Diagnosis not present

## 2014-05-11 DIAGNOSIS — E785 Hyperlipidemia, unspecified: Secondary | ICD-10-CM | POA: Diagnosis present

## 2014-05-11 DIAGNOSIS — E042 Nontoxic multinodular goiter: Secondary | ICD-10-CM | POA: Diagnosis present

## 2014-05-11 DIAGNOSIS — D360 Benign neoplasm of lymph nodes: Secondary | ICD-10-CM | POA: Diagnosis not present

## 2014-05-11 DIAGNOSIS — C50919 Malignant neoplasm of unspecified site of unspecified female breast: Secondary | ICD-10-CM | POA: Diagnosis not present

## 2014-05-11 DIAGNOSIS — Z01818 Encounter for other preprocedural examination: Secondary | ICD-10-CM | POA: Diagnosis not present

## 2014-05-11 DIAGNOSIS — E119 Type 2 diabetes mellitus without complications: Secondary | ICD-10-CM | POA: Diagnosis present

## 2014-05-11 DIAGNOSIS — E538 Deficiency of other specified B group vitamins: Secondary | ICD-10-CM | POA: Diagnosis present

## 2014-05-11 DIAGNOSIS — M2550 Pain in unspecified joint: Secondary | ICD-10-CM | POA: Diagnosis not present

## 2014-05-11 DIAGNOSIS — D649 Anemia, unspecified: Secondary | ICD-10-CM | POA: Diagnosis not present

## 2014-05-11 DIAGNOSIS — I10 Essential (primary) hypertension: Secondary | ICD-10-CM | POA: Diagnosis present

## 2014-05-11 DIAGNOSIS — Z86718 Personal history of other venous thrombosis and embolism: Secondary | ICD-10-CM | POA: Diagnosis not present

## 2014-05-11 DIAGNOSIS — I739 Peripheral vascular disease, unspecified: Secondary | ICD-10-CM | POA: Diagnosis not present

## 2014-05-11 DIAGNOSIS — Z888 Allergy status to other drugs, medicaments and biological substances status: Secondary | ICD-10-CM | POA: Diagnosis not present

## 2014-05-11 DIAGNOSIS — D0590 Unspecified type of carcinoma in situ of unspecified breast: Secondary | ICD-10-CM | POA: Diagnosis not present

## 2014-05-11 HISTORY — DX: Unspecified hemorrhoids: K64.9

## 2014-05-11 HISTORY — DX: Pain in unspecified joint: M25.50

## 2014-05-11 HISTORY — DX: Personal history of other medical treatment: Z92.89

## 2014-05-11 HISTORY — DX: Age-related osteoporosis without current pathological fracture: M81.0

## 2014-05-11 LAB — CBC
HCT: 24.3 % — ABNORMAL LOW (ref 36.0–46.0)
Hemoglobin: 6.8 g/dL — CL (ref 12.0–15.0)
MCH: 18.8 pg — ABNORMAL LOW (ref 26.0–34.0)
MCHC: 28.8 g/dL — ABNORMAL LOW (ref 30.0–36.0)
MCV: 65.3 fL — ABNORMAL LOW (ref 78.0–100.0)
PLATELETS: 490 10*3/uL — AB (ref 150–400)
RBC: 3.72 MIL/uL — AB (ref 3.87–5.11)
RDW: 17.7 % — AB (ref 11.5–15.5)
WBC: 11.1 10*3/uL — AB (ref 4.0–10.5)

## 2014-05-11 LAB — BASIC METABOLIC PANEL
BUN: 20 mg/dL (ref 6–23)
CALCIUM: 9.5 mg/dL (ref 8.4–10.5)
CO2: 26 mEq/L (ref 19–32)
CREATININE: 1.37 mg/dL — AB (ref 0.50–1.10)
Chloride: 94 mEq/L — ABNORMAL LOW (ref 96–112)
GFR calc non Af Amer: 35 mL/min — ABNORMAL LOW (ref >90)
GFR, EST AFRICAN AMERICAN: 41 mL/min — AB (ref >90)
Glucose, Bld: 64 mg/dL — ABNORMAL LOW (ref 70–99)
Potassium: 4.8 mEq/L (ref 3.7–5.3)
SODIUM: 135 meq/L — AB (ref 137–147)

## 2014-05-11 NOTE — Pre-Procedure Instructions (Signed)
Alisha [name]  05/11/2014   Your procedure is scheduled on:  Thurs, June 25 @ 7:30 AM  Report to [facility]  at 5:30 AM.  Call this number if you have problems the morning of surgery: [phone]   Remember:   Do not eat food or drink liquids after midnight.   Take these medicines the morning of surgery with A SIP OF WATER: Metoprolol(Toprol) and Synthroid               Stop taking your Mobic,Aspirin,and Fish Oil. No Goody's,BC's,Aleve,Ibuprofen,or any Herbal Medications   Do not wear jewelry, make-up or nail polish.  Do not wear lotions, powders, or perfumes.   Do not shave 48 hours prior to surgery.  Do not bring valuables to the hospital.  [facility] is not responsible                  for any belongings or valuables.               Contacts, dentures or bridgework may not be worn into surgery.  Leave suitcase in the car. After surgery it may be brought to your room.  For patients admitted to the hospital, discharge time is determined by your                treatment team.               Patients discharged the day of surgery will not be allowed to drive  home.    Special Instructions:  [facility] - Preparing for Surgery  Before surgery, you can play an important role.  Because skin is not sterile, your skin needs to be as free of germs as possible.  You can reduce the number of germs on you skin by washing with CHG (chlorahexidine gluconate) soap before surgery.  CHG is an antiseptic cleaner which kills germs and bonds with the skin to continue killing germs even after washing.  Please DO NOT use if you have an allergy to CHG or antibacterial soaps.  If your skin becomes reddened/irritated stop using the CHG and inform your nurse when you arrive at Short Stay.  Do not shave (including legs and underarms) for at least 48 hours prior to the first CHG shower.  You may shave your face.  Please follow these instructions carefully:   1.  Shower with CHG Soap the night  before surgery and the                                morning of Surgery.  2.  If you choose to wash your hair, wash your hair first as usual with your       normal shampoo.  3.  After you shampoo, rinse your hair and body thoroughly to remove the                      Shampoo.  4.  Use CHG as you would any other liquid soap.  You can apply chg directly       to the skin and wash gently with scrungie or a clean washcloth.  5.  Apply the CHG Soap to your body ONLY FROM THE NECK DOWN.        Do not use on open wounds or open sores.  Avoid contact with your eyes,       ears, mouth and genitals (  private parts).  Wash genitals (private parts)       with your normal soap.  6.  Wash thoroughly, paying special attention to the area where your surgery        will be performed.  7.  Thoroughly rinse your body with warm water from the neck down.  8.  DO NOT shower/wash with your normal soap after using and rinsing off       the CHG Soap.  9.  Pat yourself dry with a clean towel.            10.  Wear clean pajamas.            11.  Place clean sheets on your bed the night of your first shower and do not        sleep with pets.  Day of Surgery     Please read over the following fact sheets that you were given: Pain Booklet, Coughing and Deep Breathing and Surgical Site Infection Prevention

## 2014-05-11 NOTE — Progress Notes (Signed)
Dr.Byerly is out of the office this afternoon,spoke with Dr.Wakefield about H&H;being that he isn't familiar with the pt she can been seen by Medical Md(Dr.Elaine Laurann Montana) or go to the ED to have blood rechecked;Appointment for 11:15 with Laurann Montana 05/12/14 to address this issue

## 2014-05-11 NOTE — Telephone Encounter (Signed)
Christy from the hospital called and stated that Mrs. Alisha [name] is [name] 6.8. I paged the [facility] doctor and it was Alisha [name] a and he called Alisha [name] back at [phone] that the patient will need her lab redone and he told me that should have went to the urgent office doctor, I told him that I did not know, and I will make sure that Alisha Alisha [name] will know that Mrs. Alisha [name] [name] is [name]. Patient surgery is schedule for Thursday for right breast masty w/ SLNBX is schedule at 7:30 am at [facility]

## 2014-05-11 NOTE — Progress Notes (Addendum)
Dr.Elaine Laurann Montana is Medical Md   Pt doesn't have a cardiologist  Denies ever having a stress test/heart cath/echo  Denies EKG or CXR in past yr

## 2014-05-11 NOTE — Telephone Encounter (Signed)
Christy from the hospital about Alisha [name] and her lab work is

## 2014-05-11 NOTE — Progress Notes (Signed)
Mrs Frances notified of appointment at her PCP's office at  11:15 am, 05/12/24.

## 2014-05-12 ENCOUNTER — Inpatient Hospital Stay (HOSPITAL_COMMUNITY)
Admission: RE | Admit: 2014-05-12 | Discharge: 2014-05-14 | DRG: 581 | Disposition: A | Discharge status: Home / Self Care | Payer: Medicare Other | Source: Ambulatory Visit | Attending: General Surgery | Admitting: General Surgery

## 2014-05-12 ENCOUNTER — Other Ambulatory Visit (INDEPENDENT_AMBULATORY_CARE_PROVIDER_SITE_OTHER): Payer: Self-pay | Admitting: General Surgery

## 2014-05-12 ENCOUNTER — Ambulatory Visit (HOSPITAL_COMMUNITY)
Admission: RE | Admit: 2014-05-12 | Discharge: 2014-05-12 | Disposition: A | Discharge status: Home / Self Care | Payer: Medicare Other | Source: Ambulatory Visit | Attending: Family Medicine | Admitting: Family Medicine

## 2014-05-12 DIAGNOSIS — Z860100 Personal history of colon polyps, unspecified: Secondary | ICD-10-CM

## 2014-05-12 DIAGNOSIS — C50911 Malignant neoplasm of unspecified site of right female breast: Secondary | ICD-10-CM

## 2014-05-12 DIAGNOSIS — Z86718 Personal history of other venous thrombosis and embolism: Secondary | ICD-10-CM | POA: Diagnosis not present

## 2014-05-12 DIAGNOSIS — M2550 Pain in unspecified joint: Secondary | ICD-10-CM | POA: Diagnosis not present

## 2014-05-12 DIAGNOSIS — E785 Hyperlipidemia, unspecified: Secondary | ICD-10-CM | POA: Diagnosis present

## 2014-05-12 DIAGNOSIS — I10 Essential (primary) hypertension: Secondary | ICD-10-CM | POA: Diagnosis present

## 2014-05-12 DIAGNOSIS — M1990 Unspecified osteoarthritis, unspecified site: Secondary | ICD-10-CM | POA: Diagnosis not present

## 2014-05-12 DIAGNOSIS — E119 Type 2 diabetes mellitus without complications: Secondary | ICD-10-CM | POA: Diagnosis not present

## 2014-05-12 DIAGNOSIS — D360 Benign neoplasm of lymph nodes: Secondary | ICD-10-CM | POA: Diagnosis not present

## 2014-05-12 DIAGNOSIS — E042 Nontoxic multinodular goiter: Secondary | ICD-10-CM | POA: Diagnosis present

## 2014-05-12 DIAGNOSIS — Z888 Allergy status to other drugs, medicaments and biological substances status: Secondary | ICD-10-CM | POA: Diagnosis not present

## 2014-05-12 DIAGNOSIS — D649 Anemia, unspecified: Secondary | ICD-10-CM | POA: Diagnosis not present

## 2014-05-12 DIAGNOSIS — I739 Peripheral vascular disease, unspecified: Secondary | ICD-10-CM | POA: Diagnosis present

## 2014-05-12 DIAGNOSIS — C50919 Malignant neoplasm of unspecified site of unspecified female breast: Secondary | ICD-10-CM | POA: Diagnosis present

## 2014-05-12 DIAGNOSIS — E538 Deficiency of other specified B group vitamins: Secondary | ICD-10-CM | POA: Diagnosis not present

## 2014-05-12 DIAGNOSIS — D0590 Unspecified type of carcinoma in situ of unspecified breast: Secondary | ICD-10-CM | POA: Diagnosis not present

## 2014-05-12 LAB — GLUCOSE, CAPILLARY
GLUCOSE-CAPILLARY: 71 mg/dL (ref 70–99)
Glucose-Capillary: 110 mg/dL — ABNORMAL HIGH (ref 70–99)

## 2014-05-12 LAB — PREPARE RBC (CROSSMATCH)

## 2014-05-12 LAB — ABO/RH: ABO/RH(D): A POS

## 2014-05-12 MED ORDER — ACETAMINOPHEN 650 MG RE SUPP: 650.0000 mg | Freq: Four times a day (QID) | RECTAL | Status: DC | PRN

## 2014-05-12 MED ORDER — INSULIN ASPART 100 UNIT/ML ~~LOC~~ SOLN
0.0000 [IU] | Freq: Three times a day (TID) | SUBCUTANEOUS | Status: DC
  Administered 2014-05-13: 3 [IU] via SUBCUTANEOUS

## 2014-05-12 MED ORDER — ACETAMINOPHEN 325 MG PO TABS
650.0000 mg | ORAL_TABLET | Freq: Four times a day (QID) | ORAL | Status: DC | PRN
  Administered 2014-05-13 – 2014-05-14 (×2): 650 mg via ORAL
  Filled 2014-05-12 (×2): qty 2

## 2014-05-12 MED ORDER — METOPROLOL SUCCINATE ER 100 MG PO TB24
100.0000 mg | ORAL_TABLET | Freq: Every day | ORAL | Status: DC
  Administered 2014-05-13 – 2014-05-14 (×2): 100 mg via ORAL
  Filled 2014-05-12 (×3): qty 1

## 2014-05-12 MED ORDER — ONDANSETRON HCL 4 MG/2ML IJ SOLN: 4.0000 mg | Freq: Four times a day (QID) | INTRAMUSCULAR | Status: DC | PRN

## 2014-05-12 MED ORDER — CEFAZOLIN SODIUM-DEXTROSE 2-3 GM-% IV SOLR: 2.0000 g | INTRAVENOUS | Status: DC

## 2014-05-12 MED ORDER — SODIUM CHLORIDE 0.9 % IV SOLN
Freq: Once | INTRAVENOUS | Status: AC
  Administered 2014-05-12: 16:00:00 via INTRAVENOUS

## 2014-05-12 NOTE — Progress Notes (Signed)
Anesthesia chart review:  Patient is a 80 year old female scheduled for right mastectomy with SN biopsy on 05/13/14 by Dr. Barry Dienes.  History includes right breast cancer, non-smoker, DM2, osteoporosis, anemia, HTN, thyroidectomy '13 (for multinodular thyroid with Hurthle cell changes) with secondary hypothyroidism, HLD, arthritis, post-operative N/V. PCP is Dr. Kelton Pillar.  EKG on 05/11/14 showed NSR.  CXR on 05/11/14 showed: No acute abnormalities.  Preoperative labs noted.  Cr 1.37, glucose 64, WBC 11.1, PLT 490.  H/H 6.8/24.3.  Dr. Barry Dienes was out of the office yesterday, but her partner Dr. Donne Hazel was notified of lab results.  Patient has an appointment with Dr. Laurann Montana this morning at 11:15 AM. (Update: I don't have PCP notes from today, but patient was sent to [facility] for a blood transfusion.  Notes in Epic indicate that Dr. Barry Dienes is planning to have patient admitted tonight.  If CBC not rechecked prior to scheduled surgery, then she will need an ISTAT or HemoCue on arrival to re-evaluate H/H.)  [facility] Short Stay Center/Anesthesiology Phone [phone] 05/12/2014 4:07 PM

## 2014-05-12 NOTE — Discharge Instructions (Signed)

## 2014-05-12 NOTE — Telephone Encounter (Signed)
Kristen from hospital called stating Dr Barry Dienes wants [facility] to admtt patient for her since patient will be there late tonight. Paged Meagen and Dr Donne Hazel. They will call Dr Barry Dienes to discuss getting patient admitted.

## 2014-05-13 ENCOUNTER — Encounter (HOSPITAL_COMMUNITY)
Admission: RE | Disposition: A | Discharge status: Home / Self Care | Payer: Self-pay | Source: Ambulatory Visit | Attending: General Surgery

## 2014-05-13 ENCOUNTER — Encounter (HOSPITAL_COMMUNITY): Payer: Self-pay | Admitting: Anesthesiology

## 2014-05-13 ENCOUNTER — Encounter (HOSPITAL_COMMUNITY): Payer: Medicare Other | Admitting: Vascular Surgery

## 2014-05-13 ENCOUNTER — Encounter (HOSPITAL_COMMUNITY)
Admission: RE | Admit: 2014-05-13 | Discharge: 2014-05-13 | Disposition: A | Discharge status: Home / Self Care | Payer: Medicare Other | Source: Ambulatory Visit | Attending: General Surgery | Admitting: General Surgery

## 2014-05-13 ENCOUNTER — Inpatient Hospital Stay (HOSPITAL_COMMUNITY): Payer: Medicare Other | Admitting: Anesthesiology

## 2014-05-13 DIAGNOSIS — C50419 Malignant neoplasm of upper-outer quadrant of unspecified female breast: Secondary | ICD-10-CM | POA: Insufficient documentation

## 2014-05-13 DIAGNOSIS — D0590 Unspecified type of carcinoma in situ of unspecified breast: Secondary | ICD-10-CM

## 2014-05-13 DIAGNOSIS — C50911 Malignant neoplasm of unspecified site of right female breast: Secondary | ICD-10-CM

## 2014-05-13 DIAGNOSIS — D360 Benign neoplasm of lymph nodes: Secondary | ICD-10-CM | POA: Diagnosis not present

## 2014-05-13 HISTORY — PX: MASTECTOMY W/ SENTINEL NODE BIOPSY: SHX2001

## 2014-05-13 LAB — CBC
HCT: 28 % — ABNORMAL LOW (ref 36.0–46.0)
HEMOGLOBIN: 8.6 g/dL — AB (ref 12.0–15.0)
MCH: 21.4 pg — ABNORMAL LOW (ref 26.0–34.0)
MCHC: 30.7 g/dL (ref 30.0–36.0)
MCV: 69.8 fL — ABNORMAL LOW (ref 78.0–100.0)
Platelets: 373 10*3/uL (ref 150–400)
RBC: 4.01 MIL/uL (ref 3.87–5.11)
RDW: 19.8 % — AB (ref 11.5–15.5)
WBC: 7.8 10*3/uL (ref 4.0–10.5)

## 2014-05-13 LAB — TYPE AND SCREEN
ABO/RH(D): A POS
ANTIBODY SCREEN: NEGATIVE
UNIT DIVISION: 0
Unit division: 0

## 2014-05-13 LAB — GLUCOSE, CAPILLARY
GLUCOSE-CAPILLARY: 108 mg/dL — AB (ref 70–99)
GLUCOSE-CAPILLARY: 119 mg/dL — AB (ref 70–99)

## 2014-05-13 LAB — SURGICAL PCR SCREEN
MRSA, PCR: NEGATIVE
Staphylococcus aureus: NEGATIVE

## 2014-05-13 SURGERY — MASTECTOMY WITH SENTINEL LYMPH NODE BIOPSY
Anesthesia: General | Site: Breast | Laterality: Right

## 2014-05-13 MED ORDER — KCL IN DEXTROSE-NACL 20-5-0.45 MEQ/L-%-% IV SOLN
INTRAVENOUS | Status: AC
  Administered 2014-05-13: 14:00:00 via INTRAVENOUS
  Filled 2014-05-13: qty 1000

## 2014-05-13 MED ORDER — MIDAZOLAM HCL 5 MG/5ML IJ SOLN
INTRAMUSCULAR | Status: DC | PRN
  Administered 2014-05-13 (×2): 0.5 mg via INTRAVENOUS
  Administered 2014-05-13: 1 mg via INTRAVENOUS

## 2014-05-13 MED ORDER — DEXTROSE 5 % IV SOLN
INTRAVENOUS | Status: DC | PRN
  Administered 2014-05-13: 08:00:00 via INTRAVENOUS

## 2014-05-13 MED ORDER — CEFAZOLIN SODIUM-DEXTROSE 2-3 GM-% IV SOLR
INTRAVENOUS | Status: AC
  Filled 2014-05-13: qty 50

## 2014-05-13 MED ORDER — WHITE PETROLATUM GEL
Status: AC
  Administered 2014-05-13: 0.2
  Filled 2014-05-13: qty 5

## 2014-05-13 MED ORDER — FENTANYL CITRATE 0.05 MG/ML IJ SOLN
INTRAMUSCULAR | Status: DC | PRN
  Administered 2014-05-13: 50 ug via INTRAVENOUS
  Administered 2014-05-13 (×2): 25 ug via INTRAVENOUS
  Administered 2014-05-13 (×2): 50 ug via INTRAVENOUS

## 2014-05-13 MED ORDER — BUPIVACAINE-EPINEPHRINE 0.5% -1:200000 IJ SOLN
INTRAMUSCULAR | Status: DC | PRN
  Administered 2014-05-13: 24 mL

## 2014-05-13 MED ORDER — LACTATED RINGERS IV SOLN
INTRAVENOUS | Status: DC | PRN
  Administered 2014-05-13 (×2): via INTRAVENOUS

## 2014-05-13 MED ORDER — IRBESARTAN 150 MG PO TABS
150.0000 mg | ORAL_TABLET | Freq: Every day | ORAL | Status: DC
  Administered 2014-05-14: 150 mg via ORAL
  Filled 2014-05-13 (×2): qty 1

## 2014-05-13 MED ORDER — ONDANSETRON HCL 4 MG/2ML IJ SOLN
INTRAMUSCULAR | Status: AC
  Filled 2014-05-13: qty 2

## 2014-05-13 MED ORDER — OXYCODONE HCL 5 MG/5ML PO SOLN: 5.0000 mg | Freq: Once | ORAL | Status: AC | PRN

## 2014-05-13 MED ORDER — METOCLOPRAMIDE HCL 5 MG/ML IJ SOLN
INTRAMUSCULAR | Status: AC
  Filled 2014-05-13: qty 2

## 2014-05-13 MED ORDER — CEFAZOLIN SODIUM 1-5 GM-% IV SOLN
1.0000 g | Freq: Two times a day (BID) | INTRAVENOUS | Status: DC
  Administered 2014-05-13: 1 g via INTRAVENOUS
  Filled 2014-05-13 (×3): qty 50

## 2014-05-13 MED ORDER — BUPIVACAINE-EPINEPHRINE (PF) 0.5% -1:200000 IJ SOLN
INTRAMUSCULAR | Status: AC
  Filled 2014-05-13: qty 30

## 2014-05-13 MED ORDER — PROPOFOL 10 MG/ML IV BOLUS
INTRAVENOUS | Status: DC | PRN
  Administered 2014-05-13: 130 mg via INTRAVENOUS

## 2014-05-13 MED ORDER — PROPOFOL 10 MG/ML IV BOLUS
INTRAVENOUS | Status: AC
  Filled 2014-05-13: qty 20

## 2014-05-13 MED ORDER — MIDAZOLAM HCL 2 MG/2ML IJ SOLN
INTRAMUSCULAR | Status: AC
  Filled 2014-05-13: qty 2

## 2014-05-13 MED ORDER — FERROUS SULFATE 325 (65 FE) MG PO TABS
325.0000 mg | ORAL_TABLET | Freq: Every day | ORAL | Status: DC
  Administered 2014-05-14: 325 mg via ORAL
  Filled 2014-05-13 (×2): qty 1

## 2014-05-13 MED ORDER — EZETIMIBE 10 MG PO TABS
10.0000 mg | ORAL_TABLET | Freq: Every day | ORAL | Status: DC
  Administered 2014-05-14: 10 mg via ORAL
  Filled 2014-05-13 (×2): qty 1

## 2014-05-13 MED ORDER — SODIUM CHLORIDE 0.9 % IJ SOLN
INTRAMUSCULAR | Status: AC
  Filled 2014-05-13: qty 18

## 2014-05-13 MED ORDER — TECHNETIUM TC 99M SULFUR COLLOID FILTERED
1.0000 | Freq: Once | INTRAVENOUS | Status: AC | PRN
  Administered 2014-05-13: 1 via INTRADERMAL

## 2014-05-13 MED ORDER — VALSARTAN-HYDROCHLOROTHIAZIDE 160-25 MG PO TABS: 1.0000 | ORAL_TABLET | Freq: Every day | ORAL | Status: DC

## 2014-05-13 MED ORDER — METOPROLOL SUCCINATE ER 100 MG PO TB24: 100.0000 mg | ORAL_TABLET | Freq: Every day | ORAL | Status: DC

## 2014-05-13 MED ORDER — HYDROMORPHONE HCL PF 1 MG/ML IJ SOLN
INTRAMUSCULAR | Status: AC
  Filled 2014-05-13: qty 1

## 2014-05-13 MED ORDER — HYDROMORPHONE HCL PF 1 MG/ML IJ SOLN
0.2500 mg | INTRAMUSCULAR | Status: DC | PRN
Start: 2014-05-13 — End: 2014-05-13
  Administered 2014-05-13: 0.5 mg via INTRAVENOUS

## 2014-05-13 MED ORDER — SODIUM CHLORIDE 0.9 % IJ SOLN
INTRAMUSCULAR | Status: DC | PRN
  Administered 2014-05-13: 07:00:00 via INTRAMUSCULAR

## 2014-05-13 MED ORDER — IRON 90 (18 FE) MG PO TABS: 1.0000 | ORAL_TABLET | Freq: Every day | ORAL | Status: DC

## 2014-05-13 MED ORDER — ACETAMINOPHEN 500 MG PO TABS: 500.0000 mg | ORAL_TABLET | Freq: Four times a day (QID) | ORAL | Status: DC | PRN

## 2014-05-13 MED ORDER — FENTANYL CITRATE 0.05 MG/ML IJ SOLN
INTRAMUSCULAR | Status: AC
  Filled 2014-05-13: qty 5

## 2014-05-13 MED ORDER — METHYLENE BLUE 1 % INJ SOLN
INTRAMUSCULAR | Status: AC
  Filled 2014-05-13: qty 10

## 2014-05-13 MED ORDER — SODIUM CHLORIDE 0.9 % IJ SOLN
INTRAMUSCULAR | Status: DC | PRN
  Administered 2014-05-13: 36 mL

## 2014-05-13 MED ORDER — PHENYLEPHRINE HCL 10 MG/ML IJ SOLN
INTRAMUSCULAR | Status: DC | PRN
  Administered 2014-05-13: 80 ug via INTRAVENOUS
  Administered 2014-05-13 (×2): 40 ug via INTRAVENOUS

## 2014-05-13 MED ORDER — ONDANSETRON HCL 4 MG/2ML IJ SOLN: 4.0000 mg | Freq: Once | INTRAMUSCULAR | Status: DC | PRN

## 2014-05-13 MED ORDER — OXYCODONE HCL 5 MG PO TABS
5.0000 mg | ORAL_TABLET | Freq: Once | ORAL | Status: AC | PRN
  Administered 2014-05-13: 5 mg via ORAL

## 2014-05-13 MED ORDER — HYDROCHLOROTHIAZIDE 25 MG PO TABS
25.0000 mg | ORAL_TABLET | Freq: Every day | ORAL | Status: DC
  Administered 2014-05-14: 25 mg via ORAL
  Filled 2014-05-13 (×2): qty 1

## 2014-05-13 MED ORDER — SODIUM CHLORIDE 0.9 % IJ SOLN
INTRAMUSCULAR | Status: AC
  Filled 2014-05-13: qty 10

## 2014-05-13 MED ORDER — PHENYLEPHRINE 40 MCG/ML (10ML) SYRINGE FOR IV PUSH (FOR BLOOD PRESSURE SUPPORT)
PREFILLED_SYRINGE | INTRAVENOUS | Status: AC
  Filled 2014-05-13: qty 10

## 2014-05-13 MED ORDER — LIDOCAINE HCL (CARDIAC) 20 MG/ML IV SOLN
INTRAVENOUS | Status: DC | PRN
  Administered 2014-05-13: 100 mg via INTRAVENOUS

## 2014-05-13 MED ORDER — LIDOCAINE HCL (CARDIAC) 20 MG/ML IV SOLN
INTRAVENOUS | Status: AC
  Filled 2014-05-13: qty 5

## 2014-05-13 MED ORDER — GLYCOPYRROLATE 0.2 MG/ML IJ SOLN
INTRAMUSCULAR | Status: DC | PRN
  Administered 2014-05-13: .15 mg via INTRAVENOUS

## 2014-05-13 MED ORDER — ONDANSETRON HCL 4 MG/2ML IJ SOLN
4.0000 mg | Freq: Four times a day (QID) | INTRAMUSCULAR | Status: DC | PRN
  Administered 2014-05-13: 4 mg via INTRAVENOUS
  Filled 2014-05-13: qty 2

## 2014-05-13 MED ORDER — OXYCODONE HCL 5 MG PO TABS
ORAL_TABLET | ORAL | Status: AC
  Filled 2014-05-13: qty 1

## 2014-05-13 MED ORDER — ONDANSETRON HCL 4 MG/2ML IJ SOLN
INTRAMUSCULAR | Status: DC | PRN
  Administered 2014-05-13: 4 mg via INTRAVENOUS

## 2014-05-13 MED ORDER — ARTIFICIAL TEARS OP OINT
TOPICAL_OINTMENT | OPHTHALMIC | Status: AC
  Filled 2014-05-13: qty 3.5

## 2014-05-13 MED ORDER — HYDROCODONE-ACETAMINOPHEN 5-325 MG PO TABS: 1.0000 | ORAL_TABLET | ORAL | Status: DC | PRN

## 2014-05-13 MED ORDER — MEPERIDINE HCL 25 MG/ML IJ SOLN: 6.2500 mg | INTRAMUSCULAR | Status: DC | PRN

## 2014-05-13 MED ORDER — CEFAZOLIN SODIUM 1-5 GM-% IV SOLN
1.0000 g | Freq: Four times a day (QID) | INTRAVENOUS | Status: DC
  Filled 2014-05-13 (×2): qty 50

## 2014-05-13 MED ORDER — GLYCOPYRROLATE 0.2 MG/ML IJ SOLN
INTRAMUSCULAR | Status: AC
  Filled 2014-05-13: qty 1

## 2014-05-13 MED ORDER — ONDANSETRON HCL 4 MG PO TABS
4.0000 mg | ORAL_TABLET | Freq: Four times a day (QID) | ORAL | Status: DC | PRN
Start: 2014-05-13 — End: 2014-05-14

## 2014-05-13 MED ORDER — 0.9 % SODIUM CHLORIDE (POUR BTL) OPTIME
TOPICAL | Status: DC | PRN
  Administered 2014-05-13: 1000 mL

## 2014-05-13 MED ORDER — LEVOTHYROXINE SODIUM 100 MCG PO TABS
100.0000 ug | ORAL_TABLET | Freq: Every day | ORAL | Status: DC
  Administered 2014-05-14: 100 ug via ORAL
  Filled 2014-05-13 (×2): qty 1

## 2014-05-13 MED ORDER — MORPHINE SULFATE 2 MG/ML IJ SOLN
1.0000 mg | INTRAMUSCULAR | Status: DC | PRN
Start: 2014-05-13 — End: 2014-05-14

## 2014-05-13 MED ORDER — METOCLOPRAMIDE HCL 5 MG/ML IJ SOLN
INTRAMUSCULAR | Status: DC | PRN
  Administered 2014-05-13: 10 mg via INTRAVENOUS

## 2014-05-13 MED ORDER — CEFAZOLIN SODIUM-DEXTROSE 2-3 GM-% IV SOLR
INTRAVENOUS | Status: DC | PRN
  Administered 2014-05-13: 2 g via INTRAVENOUS

## 2014-05-13 MED ORDER — ARTIFICIAL TEARS OP OINT
TOPICAL_OINTMENT | OPHTHALMIC | Status: DC | PRN
  Administered 2014-05-13: 1 via OPHTHALMIC

## 2014-05-13 SURGICAL SUPPLY — 65 items
BINDER BREAST LRG (GAUZE/BANDAGES/DRESSINGS) ×3 IMPLANT
BINDER BREAST XLRG (GAUZE/BANDAGES/DRESSINGS) IMPLANT
BNDG COHESIVE 4X5 TAN STRL (GAUZE/BANDAGES/DRESSINGS) ×3 IMPLANT
CANISTER SUCTION 2500CC (MISCELLANEOUS) ×6 IMPLANT
CHLORAPREP W/TINT 26ML (MISCELLANEOUS) ×3 IMPLANT
CLIP TI MEDIUM 24 (CLIP) ×3 IMPLANT
CLIP TI MEDIUM 6 (CLIP) ×3 IMPLANT
CLIP TI WIDE RED SMALL 6 (CLIP) ×3 IMPLANT
CLOSURE WOUND 1/2 X4 (GAUZE/BANDAGES/DRESSINGS) ×1
CONT SPEC 4OZ CLIKSEAL STRL BL (MISCELLANEOUS) ×3 IMPLANT
COVER SURGICAL LIGHT HANDLE (MISCELLANEOUS) ×3 IMPLANT
COVER TRANSDUCER ULTRASND GEL (DRAPE) ×3 IMPLANT
DERMABOND ADVANCED (GAUZE/BANDAGES/DRESSINGS) ×2
DERMABOND ADVANCED .7 DNX12 (GAUZE/BANDAGES/DRESSINGS) ×1 IMPLANT
DEVICE DISSECT PLASMABLAD 3.0S (MISCELLANEOUS) ×1 IMPLANT
DRAIN CHANNEL 19F RND (DRAIN) ×3 IMPLANT
DRAPE UTILITY 15X26 W/TAPE STR (DRAPE) ×6 IMPLANT
DRSG PAD ABDOMINAL 8X10 ST (GAUZE/BANDAGES/DRESSINGS) ×6 IMPLANT
ELECT CAUTERY BLADE 6.4 (BLADE) ×3 IMPLANT
ELECT REM PT RETURN 9FT ADLT (ELECTROSURGICAL) ×6
ELECTRODE REM PT RTRN 9FT ADLT (ELECTROSURGICAL) ×2 IMPLANT
EVACUATOR SILICONE 100CC (DRAIN) ×3 IMPLANT
GLOVE BIO SURGEON STRL SZ 6 (GLOVE) ×6 IMPLANT
GLOVE BIO SURGEON STRL SZ7.5 (GLOVE) ×3 IMPLANT
GLOVE BIOGEL PI IND STRL 6.5 (GLOVE) ×2 IMPLANT
GLOVE BIOGEL PI IND STRL 7.0 (GLOVE) ×1 IMPLANT
GLOVE BIOGEL PI IND STRL 7.5 (GLOVE) ×1 IMPLANT
GLOVE BIOGEL PI INDICATOR 6.5 (GLOVE) ×4
GLOVE BIOGEL PI INDICATOR 7.0 (GLOVE) ×2
GLOVE BIOGEL PI INDICATOR 7.5 (GLOVE) ×2
GLOVE SURG SS PI 7.0 STRL IVOR (GLOVE) ×3 IMPLANT
GOWN STRL REUS W/ TWL LRG LVL3 (GOWN DISPOSABLE) ×2 IMPLANT
GOWN STRL REUS W/TWL 2XL LVL3 (GOWN DISPOSABLE) ×3 IMPLANT
GOWN STRL REUS W/TWL LRG LVL3 (GOWN DISPOSABLE) ×4
KIT BASIN OR (CUSTOM PROCEDURE TRAY) ×3 IMPLANT
KIT ROOM TURNOVER OR (KITS) ×3 IMPLANT
MARKER SKIN DUAL TIP RULER LAB (MISCELLANEOUS) IMPLANT
NEEDLE 18GX1X1/2 (RX/OR ONLY) (NEEDLE) ×3 IMPLANT
NEEDLE HYPO 25GX1X1/2 BEV (NEEDLE) ×3 IMPLANT
NEEDLE SPNL 22GX3.5 QUINCKE BK (NEEDLE) ×3 IMPLANT
NS IRRIG 1000ML POUR BTL (IV SOLUTION) ×3 IMPLANT
PACK GENERAL/GYN (CUSTOM PROCEDURE TRAY) ×3 IMPLANT
PACK UNIVERSAL I (CUSTOM PROCEDURE TRAY) ×3 IMPLANT
PAD ARMBOARD 7.5X6 YLW CONV (MISCELLANEOUS) ×6 IMPLANT
PIN SAFETY STERILE (MISCELLANEOUS) ×3 IMPLANT
PLASMABLADE 3.0S (MISCELLANEOUS) ×3
SPECIMEN JAR LARGE (MISCELLANEOUS) ×3 IMPLANT
SPECIMEN JAR X LARGE (MISCELLANEOUS) IMPLANT
SPONGE GAUZE 4X4 12PLY (GAUZE/BANDAGES/DRESSINGS) ×3 IMPLANT
SPONGE LAP 18X18 X RAY DECT (DISPOSABLE) ×3 IMPLANT
STAPLER VISISTAT 35W (STAPLE) ×3 IMPLANT
STOCKINETTE IMPERVIOUS 9X36 MD (GAUZE/BANDAGES/DRESSINGS) ×3 IMPLANT
STRIP CLOSURE SKIN 1/2X4 (GAUZE/BANDAGES/DRESSINGS) ×2 IMPLANT
SUT ETHILON 2 0 FS 18 (SUTURE) ×3 IMPLANT
SUT MON AB 4-0 PC3 18 (SUTURE) ×3 IMPLANT
SUT SILK 2 0 (SUTURE) ×2
SUT SILK 2 0 FS (SUTURE) ×3 IMPLANT
SUT SILK 2-0 18XBRD TIE 12 (SUTURE) ×1 IMPLANT
SUT VIC AB 3-0 SH 8-18 (SUTURE) ×3 IMPLANT
SYR 50ML LL SCALE MARK (SYRINGE) ×3 IMPLANT
SYR CONTROL 10ML LL (SYRINGE) ×3 IMPLANT
TOWEL OR 17X24 6PK STRL BLUE (TOWEL DISPOSABLE) ×3 IMPLANT
TOWEL OR 17X26 10 PK STRL BLUE (TOWEL DISPOSABLE) ×3 IMPLANT
TUBE CONNECTING 12'X1/4 (SUCTIONS) ×1
TUBE CONNECTING 12X1/4 (SUCTIONS) ×2 IMPLANT

## 2014-05-13 NOTE — Transfer of Care (Signed)
Immediate Anesthesia Transfer of Care Note  Patient: Alisha [name]  Procedure(s) Performed: Procedure(s): MASTECTOMY WITH SENTINEL LYMPH NODE BIOPSY (Right)  Patient Location: PACU  Anesthesia Type:General  Level of Consciousness: awake, oriented and patient cooperative  Airway & Oxygen Therapy: Patient Spontanous Breathing and Patient connected to nasal cannula oxygen  Post-op Assessment: Report given to PACU RN, Post -op Vital signs reviewed and stable and Patient moving all extremities  Post vital signs: Reviewed and stable  Complications: No apparent anesthesia complications

## 2014-05-13 NOTE — Discharge Instructions (Signed)
[facility] °[phone] ° °MASTECTOMY: POST OP INSTRUCTIONS ° °Always review your discharge instruction sheet given to you by the facility where your surgery was performed. °IF YOU HAVE DISABILITY OR FAMILY LEAVE FORMS, YOU MUST BRING THEM TO THE OFFICE FOR PROCESSING.   °DO NOT GIVE THEM TO YOUR DOCTOR. °A prescription for pain medication may be given to you upon discharge.  Take your pain medication as prescribed, if needed.  If narcotic pain medicine is not needed, then you may take acetaminophen (Tylenol) or ibuprofen (Advil) as needed. °1. Take your usually prescribed medications unless otherwise directed. °2. If you need a refill on your pain medication, please contact your pharmacy.  They will contact our office to request authorization.  Prescriptions will not be filled after 5pm or on week-ends. °3. You should follow a light diet the first few days after arrival home, such as soup and crackers, etc.  Resume your normal diet the day after surgery. °4. Most patients will experience some swelling and bruising on the chest and underarm.  Ice packs will help.  Swelling and bruising can take several days to resolve.  °5. It is common to experience some constipation if taking pain medication after surgery.  Increasing fluid intake and taking a stool softener (such as Colace) will usually help or prevent this problem from occurring.  A mild laxative (Milk of Magnesia or Miralax) should be taken according to package instructions if there are no bowel movements after 48 hours. °6. Unless discharge instructions indicate otherwise, leave your bandage dry and in place until your next appointment in 3-5 days.  You may take a limited sponge bath.  No tube baths or showers until the drains are removed.  You may have steri-strips (small skin tapes) in place directly over the incision.  These strips should be left on the skin for 7-10 days.  If your surgeon used skin glue on the incision, you may  shower in 24 hours.  The glue will flake off over the next 2-3 weeks.  Any sutures or staples will be removed at the office during your follow-up visit. °7. DRAINS:  If you have drains in place, it is important to keep a list of the amount of drainage produced each day in your drains.  Before leaving the hospital, you should be instructed on drain care.  Call our office if you have any questions about your drains. °8. ACTIVITIES:  You may resume regular (light) daily activities beginning the next day--such as daily self-care, walking, climbing stairs--gradually increasing activities as tolerated.  You may have sexual intercourse when it is comfortable.  Refrain from any heavy lifting or straining until approved by your doctor. °a. You may drive when you are no longer taking prescription pain medication, you can comfortably wear a seatbelt, and you can safely maneuver your car and apply brakes. °b. RETURN TO WORK:  __________________________________________________________ °9. You should see your doctor in the office for a follow-up appointment approximately 3-5 days after your surgery.  Your doctor’s nurse will typically make your follow-up appointment when she calls you with your pathology report.  Expect your pathology report 2-3 business days after your surgery.  You may call to check if you do not hear from us after three days.   °10. OTHER INSTRUCTIONS: ______________________________________________________________________________________________ ____________________________________________________________________________________________ °WHEN TO CALL YOUR DOCTOR: °1. Fever over 101.0 °2. Nausea and/or vomiting °3. Extreme swelling or bruising °4. Continued bleeding from incision. °5. Increased pain, redness, or drainage from the incision. °  The clinic staff is available to answer your questions during regular business hours.  Please don’t hesitate to call and ask to speak to one of the nurses for clinical  concerns.  If you have a medical emergency, go to the nearest emergency room or call 911.  A surgeon from [facility] is always on call at the hospital. °[address], [address], [place], [place]  [address] ? [address] [address], [place], [place]   [address] °[phone] ? [phone] ? FAX [phone] °Web site: [email or web address] °

## 2014-05-13 NOTE — Anesthesia Procedure Notes (Signed)
Procedure Name: LMA Insertion Date/Time: 05/13/2014 7:56 AM Performed by: Terrill Mohr Pre-anesthesia Checklist: Patient identified, Emergency Drugs available, Suction available and Patient being monitored Patient Re-evaluated:Patient Re-evaluated prior to inductionOxygen Delivery Method: Circle system utilized Preoxygenation: Pre-oxygenation with 100% oxygen Intubation Type: IV induction Ventilation: Mask ventilation without difficulty LMA: LMA inserted LMA Size: 4.0 Number of attempts: 1 Placement Confirmation: breath sounds checked- equal and bilateral and positive ETCO2 Tube secured with: Tape (taped across cheeks; gauze roll b/t teeth and gum) Dental Injury: Teeth and Oropharynx as per pre-operative assessment

## 2014-05-13 NOTE — Anesthesia Postprocedure Evaluation (Signed)
Anesthesia Post Note  Patient: Alisha [name]  Procedure(s) Performed: Procedure(s) (LRB): MASTECTOMY WITH SENTINEL LYMPH NODE BIOPSY (Right)  Anesthesia type: general  Patient location: PACU  Post pain: Pain level controlled  Post assessment: Patient's Cardiovascular Status Stable  Last Vitals:  Filed Vitals:   05/13/14 1410  BP: 138/83  Pulse: 76  Temp: 36.6 C  Resp: 16    Post vital signs: Reviewed and stable  Level of consciousness: sedated  Complications: No apparent anesthesia complications

## 2014-05-13 NOTE — H&P (View-Only) (Signed)
Chief complaint:  New right breast DCIS  HISTORY: The patient is an 80 year old female who is referred by Dr. Laurann Montana for evaluation of a new right breast cancer. She presented with 6 month followup of calcifications. The calcifications were more concerning and measured 5.3 cm in diameter. She underwent biopsy and was found to have high-grade DCIS. She had an MRI which was concerning for a similar area centrally located. The area of biopsy was of the anterior aspect of the calcifications. She has not had prior surgeries on her breasts. She does not have any family history of breast or of any cancer or other cancer. She had menarche at age 71. She is no longer having periods and underwent menopause around 27 years ago. She did not use hormone replacement or terminal contraception. She had one child at age 58. She is up-to-date on her colonoscopy and bone density study.  Past Medical History  Diagnosis Date  . Arthritis   . Anemia   . Hyperlipidemia   . B12 deficiency   . Osteopenia   . Colon polyps   . Multiple thyroid nodules   . PONV (postoperative nausea and vomiting)   . Peripheral vascular disease     DVT  2008 with ? anticoagulation  . Blood transfusion   . Diabetes mellitus   . Hypertension     followed by Dr Laurann Montana EKG EPIC dated 8/12    Past Surgical History  Procedure Laterality Date  . Femur surgery  07/08/11    broken in 3 places   . Hemmorrhoidectomy  2008  . Fracture surgery      8/12 femur fx with rod and bolt placed  . Hernia repair  1945  . Thyroidectomy  12/04/2011    Procedure: THYROIDECTOMY;  Surgeon: Earnstine Regal, MD;  Location: [facility];  Service: General;  Laterality: N/A;  total thyroidectomy    Current Outpatient Prescriptions  Medication Sig Dispense Refill  . acetaminophen (TYLENOL) 500 MG tablet Take 500 mg by mouth every 6 (six) hours as needed. Pain       . aspirin 81 MG tablet Take 81 mg by mouth daily before breakfast.       . calcium-vitamin D  (CALCIUM + D) 250-125 MG-UNIT per tablet Take 2 tablets by mouth 3 (three) times daily.  60 tablet  2  . Ferrous Sulfate (IRON) 90 (18 FE) MG TABS Take 1 tablet by mouth daily.       . fish oil-omega-3 fatty acids 1000 MG capsule Take 1 g by mouth daily.       . furosemide (LASIX) 20 MG tablet 20 mg Daily.      Marland Kitchen GLIPIZIDE XL 5 MG 24 hr tablet Take 5 mg by mouth daily before breakfast.       . meloxicam (MOBIC) 15 MG tablet Take 15 mg by mouth daily with supper.       . metFORMIN (GLUCOPHAGE) 850 MG tablet Take 850 mg by mouth 3 (three) times daily.       . metoprolol (TOPROL-XL) 100 MG 24 hr tablet Take 100 mg by mouth daily before breakfast.       . Multiple Vitamin (MULTIVITAMIN PO) Take 1 tablet by mouth daily.       Marland Kitchen SYNTHROID 88 MCG tablet Take 1 tablet (88 mcg total) by mouth daily.  30 tablet  3  . valsartan-hydrochlorothiazide (DIOVAN-HCT) 160-25 MG per tablet Take 1 tablet by mouth daily before breakfast.       .  ZETIA 10 MG tablet Take 10 mg by mouth daily before breakfast.        No current facility-administered medications for this visit.     Allergies  Allergen Reactions  . Ace Inhibitors     Cough   . Evista [Raloxifene Hydrochloride]     cramps   . Zocor [Simvastatin - High Dose]     Cramps      No family history on file.   History   Social History  . Marital Status: Married    Spouse Name: N/A    Number of Children: N/A  . Years of Education: N/A   Social History Main Topics  . Smoking status: Never Smoker   . Smokeless tobacco: Never Used  . Alcohol Use: No  . Drug Use: No  . Sexual Activity: Not on file   Other Topics Concern  . Not on file   Social History Narrative  . No narrative on file     REVIEW OF SYSTEMS - PERTINENT POSITIVES ONLY: 12 point review of systems negative other than HPI and PMH  EXAM: Wt Readings from Last 3 Encounters:  04/28/14 151 lb 4.8 oz (68.629 kg)  04/01/12 157 lb (71.215 kg)  02/04/12 158 lb 3.2 oz (71.759  kg)   Temp Readings from Last 3 Encounters:  04/28/14 98.6 F (37 C)   04/01/12 97.6 F (36.4 C) Temporal  02/04/12 98.4 F (36.9 C) Temporal   BP Readings from Last 3 Encounters:  04/28/14 151/72  04/01/12 152/100  02/04/12 146/88   Pulse Readings from Last 3 Encounters:  04/28/14 92  04/01/12 78  02/04/12 98     Wt Readings from Last 3 Encounters:  04/28/14 151 lb 4.8 oz (68.629 kg)  04/01/12 157 lb (71.215 kg)  02/04/12 158 lb 3.2 oz (71.759 kg)     Gen:  No acute distress.  Well nourished and well groomed.   Neurological: Alert and oriented to person, place, and time. Coordination normal.  Head: Normocephalic and atraumatic.  Eyes: Conjunctivae are normal. Pupils are equal, round, and reactive to light. No scleral icterus.  Neck: Normal range of motion. Neck supple. No tracheal deviation or thyromegaly present.  Cardiovascular: Normal rate, regular rhythm, normal heart sounds and intact distal pulses.  Exam reveals no gallop and no friction rub.  No murmur heard. Breast: hematoma at 9 o'clock on right breast.  No palpable masses, skin dimpling, nipple retraction, lymphadenopathy.   Respiratory: Effort normal.  No respiratory distress. No chest wall tenderness. Breath sounds normal.  No wheezes, rales or rhonchi.  GI: Soft. Bowel sounds are normal. The abdomen is soft and nontender.  There is no rebound and no guarding.  Musculoskeletal: Normal range of motion. Extremities are nontender.  Lymphadenopathy: No cervical, preauricular, postauricular or axillary adenopathy is present Skin: Skin is warm and dry. No rash noted. No diaphoresis. No erythema. No pallor. No clubbing, cyanosis, or edema.   Psychiatric: Normal mood and affect. Behavior is normal. Judgment and thought content normal.    LABORATORY RESULTS: Available labs are reviewed   Diagnosis Breast, right, needle core biopsy - DUCTAL CARCINOMA IN SITU WITH CALCIFICATIONS. - SEE COMMENT.  ER/PR  -  RADIOLOGY RESULTS: See E-Chart or I-Site for most recent results.  Images and reports are reviewed.  Mr Breast Bilateral W Wo Contrast  04/27/2014   CLINICAL DATA:  Recently diagnosed high-grade right breast DCIS. Preoperative evaluation.  LABS:  BUN and creatinine were obtained on site at [facility]  at  [address]  Results:  BUN 17 mg/dL,  Creatinine 1.3 mg/dL.  EXAM: BILATERAL BREAST MRI WITH AND WITHOUT CONTRAST  TECHNIQUE: Multiplanar, multisequence MR images of both breasts were obtained prior to and following the intravenous administration of 62ml of MultiHance  THREE-DIMENSIONAL MR IMAGE RENDERING ON INDEPENDENT WORKSTATION:  Three-dimensional MR images were rendered by post-processing of the original MR data on an independent workstation. The three-dimensional MR images were interpreted, and findings are reported in the following complete MRI report for this study. Three dimensional images were evaluated at the independent DynaCad workstation  COMPARISON:  Previous mammograms from [facility] dated 04/15/2014 and 03/30/2013.  FINDINGS: Breast composition: b.  Scattered fibroglandular tissue.  Background parenchymal enhancement: Moderate  Right breast: There is clumped linear and nodular enhancement located within the lateral portion of the right breast extending into the upper-outer quadrant. This measures 4.0 x 5.0 x 3.0 cm in size and correlates with the distribution of the calcifications present within the upper outer quadrant of the right breast on mammography. This enhancement is worrisome for DCIS. There is a signal void associated with the patient's stereotactic biopsy clip with surrounding hematoma measuring approximately 3 cm in size. The biopsy clip is associated with the anterior aspect of the area of enhancement. There are no additional findings within the right breast.  Left breast: No mass or abnormal enhancement.  Lymph nodes: No abnormal appearing lymph nodes.   Ancillary findings:  None.  IMPRESSION: Clumped linear and nodular enhancement located within the lateral portion right breast extending into the upper-outer quadrant which spans 5.0 x 4.0 x 3.0 cm in size and does correlate with the distribution of calcifications seen in this region on mammography. The stereotactic biopsy clip is associated with the anterior extent of this enhancement. If breast conservation is planned, stereotactic biopsy of the posterior extent of calcifications is recommended. No evidence for adenopathy or additional findings.  RECOMMENDATION: Treatment plan.  BI-RADS CATEGORY  6: Known biopsy-proven malignancy.   Electronically Signed   By: Luberta Robertson M.D.   On: 04/27/2014 11:30      ASSESSMENT AND PLAN: Breast cancer of upper-outer quadrant of right female breast Patient does desire breast conservation if this is possible. We'll schedule her for a stereo guided biopsy of the posterior extent of her calcifications.  It this whole area is DCIS, she will need a mastectomy due to a breast size discrepancy with the DCIS. She would only have a small amount of normal breast tissue  if this area were all involved.  Of note, her DCIS is ER and PR negative. She would not need to see oncology or need anti-hormonal therapy if the area is all noninvasive. Due to the high-grade nature of this I would perform a sentinel lymph node biopsy as there is a significant risk of having invasive cancer in the lesion.  We discussed lumpectomy and sentinel lymph node biopsy. I reviewed the risks and benefits of surgery. I advised the patient that if she needs a mastectomy then I will have her come back to further discuss this. I did review that a lumpectomy as an outpatient surgery and a mastectomy requires overnight stay with a drain for several weeks.  I discussed the risk of bleeding, infection, need for additional surgeries, possible cardiac or pulmonary complications, blood clots, etc. I also  discussed that even if we were able to start with a lumpectomy, but sometimes we get positive margins that requires additional surgery. I  discussed that sometimes we cannot clear these margins and end up having to do a mastectomy anyway. She will have her final recommendation regarding radiation after her final pathology is back. She is now recommended to get up front reconstruction with a mastectomy due to her pathology.   This was discussed with her and her son.  45 min spent in consultation, counseling, evaluation, examination, and coordination of care.  >50% spent in counseling.         Milus Height MD Surgical Oncology, General and [facility]      Visit Diagnoses: 1. Breast cancer of upper-outer quadrant of right female breast     Primary Care Physician: Osborne Casco, MD

## 2014-05-13 NOTE — Interval H&P Note (Signed)
History and Physical Interval Note:  05/13/2014 7:24 AM  Alisha [name]  has presented today for surgery, with the diagnosis of right breast cancer  The various methods of treatment have been discussed with the patient and family. After consideration of risks, benefits and other options for treatment, the patient has consented to  Procedure(s): MASTECTOMY WITH SENTINEL LYMPH NODE BIOPSY (Right) as a surgical intervention .  The patient was found to be anemic, more than usual several days ago.  She was given 2 units of blood yesterday and is much better this AM.  The patient's history has been reviewed, patient examined, no change in status, stable for surgery.  I have reviewed the patient's chart and labs.  Questions were answered to the patient's satisfaction.     Gabi Mcfate

## 2014-05-13 NOTE — Op Note (Addendum)
Right Mastectomy with Sentinel Node Biopsy Procedure Note  Indications: This patient presents with history of Right breast cancer with clinically negative axillary lymph node exam.  Pre-operative Diagnosis: right breast cancer, cTis  Post-operative Diagnosis: right breast cancer, same  Surgeon: Stark Klein   Anesthesia: General endotracheal anesthesia and Local anesthesia 0.25.% bupivacaine, with epinephrine  ASA Class: 2  Procedure Details  The patient was seen in the Holding Room. The risks, benefits, complications, treatment options, and expected outcomes were discussed with the patient. The possibilities of reaction to medication, pulmonary aspiration, bleeding, infection, the need for additional procedures, failure to diagnose a condition, and creating a complication requiring transfusion or operation were discussed with the patient. The patient concurred with the proposed plan, giving informed consent.  The site of surgery properly noted/marked. The patient was taken to Operating Room # 2, identified as Alisha [name] and the procedure verified as Right Mastectomy and Sentinel Node Biopsy. A Time Out was held and the above information confirmed.  The methylene blue was injected in the subareolar location.    After induction of anesthesia, the right arm, breast, and chest were prepped and draped in standard fashion.   The borders of the breast were identified and marked.  The incisions of the breast was drawn out to make sure incision lines were equidistant in length.  The lower outer quadrant had an area of skin dimpling.  This was incorporated into the incision.  The superior incision was made with the PlasmaBlade.  The marcaine/saline mixture was infiltrated into the superior flap.  Mastectomy hooks were used to provide elevation of the skin edges, and the PlasmaBlade was used to create the mastectomy flaps.  The dissection was taken to the fascia of the pectoralis major.  The penetrating  vessels were clipped.  The superior flap was taken medially to the lateral sternal border, superiorly to the inferior border of the clavicle.  The inferior flap was similarly created, inferiorly to the inframammary fold and laterally to the border of the latissimus.  The breast was taken off including the pectoralis fascia and the axillary tail marked.    Using a hand-held gamma probe, axillary sentinel nodes were identified.  Three level 2 axillary sentinel nodes were removed and submitted to pathology.  The findings are below.  The lymphovascular channels were clipped with metal clips.        The wound was irrigated.  One 38 Blake drains was placed laterally.   Hemostasis was achieved with cautery.  The wound was irrigated and closed with a 3-0 Vicryl deep dermal interrupted sutures and 4-0 monocryl subcuticular closure in layers.    Sterile dressings were applied. At the end of the operation, all sponge, instrument, and needle counts were correct.  Findings: grossly clear surgical margins, SLN #1 palpable, #2 cps 450, #3 cps 140  Estimated Blood Loss:  less than 50 mL         Drains:  19 Fr Blakes                Specimens: R breast and 3 axillary sentinel nodes         Complications:  None; patient tolerated the procedure well.         Disposition: PACU - hemodynamically stable.         Condition: stable

## 2014-05-13 NOTE — Anesthesia Preprocedure Evaluation (Addendum)
Anesthesia Evaluation  Patient identified by MRN, date of birth, ID band Patient awake    Reviewed: Allergy & Precautions, H&P , NPO status , Patient's Chart, lab work & pertinent test results, reviewed documented beta blocker date and time   History of Anesthesia Complications (+) PONV  Airway Mallampati: I TM Distance: >3 FB Neck ROM: Full    Dental  (+) Edentulous Upper, Partial Lower, Dental Advisory Given   Pulmonary          Cardiovascular hypertension, Pt. on home beta blockers and Pt. on medications     Neuro/Psych    GI/Hepatic   Endo/Other  diabetes, Well Controlled, Type 2, Oral Hypoglycemic AgentsHypothyroidism Did not get thyroid prep this morning  Renal/GU      Musculoskeletal   Abdominal   Peds  Hematology   Anesthesia Other Findings   Reproductive/Obstetrics                          Anesthesia Physical Anesthesia Plan  ASA: II  Anesthesia Plan: General   Post-op Pain Management:    Induction: Intravenous  Airway Management Planned: LMA  Additional Equipment:   Intra-op Plan:   Post-operative Plan: Extubation in OR  Informed Consent: I have reviewed the patients History and Physical, chart, labs and discussed the procedure including the risks, benefits and alternatives for the proposed anesthesia with the patient or authorized representative who has indicated his/her understanding and acceptance.     Plan Discussed with: CRNA and Surgeon  Anesthesia Plan Comments:         Anesthesia Quick Evaluation

## 2014-05-14 ENCOUNTER — Encounter (HOSPITAL_COMMUNITY): Payer: Self-pay | Admitting: General Surgery

## 2014-05-14 LAB — BASIC METABOLIC PANEL
BUN: 16 mg/dL (ref 6–23)
CALCIUM: 8.7 mg/dL (ref 8.4–10.5)
CO2: 27 meq/L (ref 19–32)
CREATININE: 1.29 mg/dL — AB (ref 0.50–1.10)
Chloride: 94 mEq/L — ABNORMAL LOW (ref 96–112)
GFR calc Af Amer: 44 mL/min — ABNORMAL LOW (ref >90)
GFR calc non Af Amer: 38 mL/min — ABNORMAL LOW (ref >90)
Glucose, Bld: 130 mg/dL — ABNORMAL HIGH (ref 70–99)
Potassium: 4.4 mEq/L (ref 3.7–5.3)
Sodium: 133 mEq/L — ABNORMAL LOW (ref 137–147)

## 2014-05-14 LAB — CBC
HCT: 26.3 % — ABNORMAL LOW (ref 36.0–46.0)
HEMOGLOBIN: 8.1 g/dL — AB (ref 12.0–15.0)
MCH: 21.4 pg — AB (ref 26.0–34.0)
MCHC: 30.8 g/dL (ref 30.0–36.0)
MCV: 69.4 fL — AB (ref 78.0–100.0)
Platelets: 286 10*3/uL (ref 150–400)
RBC: 3.79 MIL/uL — AB (ref 3.87–5.11)
RDW: 20.1 % — ABNORMAL HIGH (ref 11.5–15.5)
WBC: 8.1 10*3/uL (ref 4.0–10.5)

## 2014-05-14 LAB — GLUCOSE, CAPILLARY
GLUCOSE-CAPILLARY: 183 mg/dL — AB (ref 70–99)
GLUCOSE-CAPILLARY: 109 mg/dL — AB (ref 70–99)
Glucose-Capillary: 145 mg/dL — ABNORMAL HIGH (ref 70–99)

## 2014-05-14 MED ORDER — HYDROCODONE-ACETAMINOPHEN 5-325 MG PO TABS: 1.0000 | ORAL_TABLET | Freq: Four times a day (QID) | ORAL | Status: DC | PRN

## 2014-05-14 NOTE — Discharge Summary (Signed)
Physician Discharge Summary  Patient ID: Alisha [name] MRN: [id number] DOB/AGE: [birth year] 78 y.o.  Admit date: 05/12/2014 Discharge date: 05/14/2014  Admission Diagnoses: Patient Active Problem List   Diagnosis Date Noted  . Breast cancer of upper-outer quadrant of right female breast 04/26/2014    Priority: High  . Breast cancer 05/12/2014  . Hypothyroidism, postsurgical 02/04/2012  . Multinodular goiter (nontoxic), atypia on cytopathology 10/18/2011  anemia - chronic   Discharge Diagnoses:  Patient Active Problem List   Diagnosis Date Noted  . Breast cancer of upper-outer quadrant of right female breast 04/26/2014    Priority: High  . Breast cancer 05/12/2014  . Hypothyroidism, postsurgical 02/04/2012  . Multinodular goiter (nontoxic), atypia on cytopathology 10/18/2011   Anemia- chronic   Discharged Condition: stable  Hospital Course:  Pt was admitted from short stay when she was found to be very anemic on her pre op labs.  Her PCP arranged for her to get blood in short stay, but they were unable to get it typed and cross before closing time.  She was admitted for anemia and to have her surgery.  She did receive 2 units of typed cells.  She was taken to the OR the next day for right mastectomy with SLN bx.  She was taken back to the floor.  She was able to get out of bed.  She was able to eat without nausea.  She was able to void independently.  Her drain output was acceptable in amount and quality.    Consults: None  Significant Diagnostic Studies: labs: HCT prior to d/c 26.3.  On admission, 24.3  Treatments: surgery: right mastectomy and SLN bx and transfusion  Discharge Exam: Blood pressure 135/60, pulse 67, temperature 97.7 F (36.5 C), temperature source Oral, resp. rate 16, height 5\' 2"  (1.575 m), weight 151 lb (68.493 kg), SpO2 99.00%. General appearance: alert, cooperative and no distress Resp: breathing comfortably Chest wall: right sided chest wall tenderness  as anticipated.    Disposition: 01-Home or Self Care  Discharge Instructions   Call MD for:  difficulty breathing, headache or visual disturbances    Complete by:  As directed      Call MD for:  persistant dizziness or light-headedness    Complete by:  As directed      Call MD for:  persistant nausea and vomiting    Complete by:  As directed      Call MD for:  redness, tenderness, or signs of infection (pain, swelling, redness, odor or green/yellow discharge around incision site)    Complete by:  As directed      Call MD for:  severe uncontrolled pain    Complete by:  As directed      Call MD for:  temperature >100.4    Complete by:  As directed      Diet - low sodium heart healthy    Complete by:  As directed      Increase activity slowly    Complete by:  As directed             Medication List         acetaminophen 500 MG tablet  Commonly known as:  TYLENOL  - Take 500 mg by mouth every 6 (six) hours as needed. Pain  -      alendronate 70 MG tablet  Commonly known as:  FOSAMAX  Take 70 mg by mouth once a week. Take with a full glass of water on an  empty stomach.     aspirin 81 MG tablet  Take 81 mg by mouth daily before breakfast.     CALCIUM 600 + D 600-200 MG-UNIT Tabs  Generic drug:  Calcium Carb-Cholecalciferol  Take 1 tablet by mouth daily.     cyanocobalamin 1000 MCG/ML injection  Commonly known as:  (VITAMIN B-12)  Inject 1,000 mcg into the muscle every 3 (three) months.     GLIPIZIDE XL 5 MG 24 hr tablet  Generic drug:  glipiZIDE  Take 5 mg by mouth daily before breakfast.     HYDROcodone-acetaminophen 5-325 MG per tablet  Commonly known as:  NORCO/VICODIN  Take 1 tablet by mouth every 6 (six) hours as needed for moderate pain.     Iron 90 (18 FE) MG Tabs  Take 1 tablet by mouth daily.     levothyroxine 100 MCG tablet  Commonly known as:  SYNTHROID, LEVOTHROID  Take 100 mcg by mouth daily before breakfast.     metFORMIN 850 MG tablet   Commonly known as:  GLUCOPHAGE  Take 850 mg by mouth 3 (three) times daily.     metoprolol succinate 100 MG 24 hr tablet  Commonly known as:  TOPROL-XL  Take 100 mg by mouth daily before breakfast.     valsartan-hydrochlorothiazide 160-25 MG per tablet  Commonly known as:  DIOVAN-HCT  Take 1 tablet by mouth daily before breakfast.     ZETIA 10 MG tablet  Generic drug:  ezetimibe  Take 10 mg by mouth daily before breakfast.           Follow-up Information   Follow up with Stark Klein, MD On 05/24/2014. (10:15, be there at 10 AM)    Specialty:  General Surgery   Contact information:   [address] [place] [address] [phone]       Signed: Stark Klein 05/14/2014, 8:14 AM

## 2014-05-14 NOTE — Discharge Planning (Signed)
Copy of AVS  And rx and JP supplies to pt. Who verbalizes understanding. D'cd per w/c to private car home with all personal belongings acomp. By family. At 1010.

## 2014-05-17 ENCOUNTER — Encounter (INDEPENDENT_AMBULATORY_CARE_PROVIDER_SITE_OTHER): Payer: Self-pay | Admitting: *Deleted

## 2014-05-17 ENCOUNTER — Ambulatory Visit (INDEPENDENT_AMBULATORY_CARE_PROVIDER_SITE_OTHER): Payer: Medicare Other | Admitting: *Deleted

## 2014-05-17 ENCOUNTER — Telehealth (INDEPENDENT_AMBULATORY_CARE_PROVIDER_SITE_OTHER): Payer: Self-pay

## 2014-05-17 VITALS — Temp 98.3°F | Ht 62.0 in | Wt 150.0 lb

## 2014-05-17 DIAGNOSIS — Z5189 Encounter for other specified aftercare: Secondary | ICD-10-CM

## 2014-05-17 NOTE — Telephone Encounter (Signed)
Pt made aware of her results at nurse only visit today.

## 2014-05-17 NOTE — Progress Notes (Signed)
Pt came in today concerned about the bulb on her drain.  She is s/p right masty.  Jearld Fenton and myself checked her drain.  Pt was assured that the drain was working properly and that due to the amount of drainage still coming out, that it was not ready to be removed.  While pt was in the office, Jearld Fenton gave pt her pathology results.  Pt was very happy with results. Anderson Malta

## 2014-05-17 NOTE — Telephone Encounter (Signed)
Pathology shows DCIS (non invasive cancer).  All lymph nodes removed were negative for cancer.

## 2014-05-24 ENCOUNTER — Encounter (INDEPENDENT_AMBULATORY_CARE_PROVIDER_SITE_OTHER): Payer: Self-pay | Admitting: General Surgery

## 2014-05-24 ENCOUNTER — Other Ambulatory Visit (INDEPENDENT_AMBULATORY_CARE_PROVIDER_SITE_OTHER): Payer: Self-pay

## 2014-05-24 ENCOUNTER — Ambulatory Visit (INDEPENDENT_AMBULATORY_CARE_PROVIDER_SITE_OTHER): Payer: Medicare Other | Admitting: General Surgery

## 2014-05-24 VITALS — BP 130/80 | HR 72 | Resp 18 | Ht 62.0 in | Wt 144.4 lb

## 2014-05-24 DIAGNOSIS — C50411 Malignant neoplasm of upper-outer quadrant of right female breast: Secondary | ICD-10-CM

## 2014-05-24 DIAGNOSIS — C50419 Malignant neoplasm of upper-outer quadrant of unspecified female breast: Secondary | ICD-10-CM

## 2014-05-24 MED ORDER — UNABLE TO FIND: Status: DC

## 2014-05-24 NOTE — Assessment & Plan Note (Signed)
Drain still not ready to come out.    Will have her see Jearld Fenton next week for drain removal.  Will get appt with Dr. Laurann Montana or PA.

## 2014-05-24 NOTE — Patient Instructions (Addendum)
Follow up next week for drain removal.  We need the drain output to be 4 tsp or less a day for 3 days in a row.   (20 mL)  Follow up with Dr. Delene Ruffini PA Thursday 11:30.      Follow up with me in 3-4 weeks.

## 2014-05-24 NOTE — Progress Notes (Signed)
HISTORY: Pt is doing well s/p right mastectomy with SLN bx.  She is still having around 30-50 mL/day drain output.  She is not taking pain meds other than tylenol.  She is not having fever/chills.     EXAM: General:  Alert and oriented.   Incision:  Healing well.  No evidence of infection.     PATHOLOGY: Diagnosis 1. Breast, simple mastectomy, Right - DUCTAL CARCINOMA IN SITU WITH CALCIFICATIONS, HIGH GRADE, SPANNING 4.5 CM. - THE SURGICAL RESECTION MARGINS ARE NEGATIVE FOR CARCINOMA. - THERE IS NO EVIDENCE OF CARCINOMA IN 2 OF 2 LYMPH NODES (0/2). - SEE ONCOLOGY TABLE BELOW. 2. Lymph node, sentinel, biopsy, Right axillary #1 - THERE IS NO EVIDENCE OF CARCINOMA IN 1 OF 1 LYMPH NODE (0/1). 3. Lymph node, sentinel, biopsy, Right axillary #2 - THERE IS NO EVIDENCE OF CARCINOMA IN 1 OF 1 LYMPH NODE (0/1). 4. Lymph node, sentinel, biopsy, Right axillary #3 - THERE IS NO EVIDENCE OF CARCINOMA IN 1 OF 1 LYMPH NODE (0/1).   ASSESSMENT AND PLAN:   Breast cancer of upper-outer quadrant of right female breast Drain still not ready to come out.    Will have her see Jearld Fenton next week for drain removal.  Will get appt with Dr. Laurann Montana or PA.        Milus Height, MD Surgical Oncology, [facility]  Osborne Casco, MD Kelton Pillar, MD

## 2014-05-26 ENCOUNTER — Encounter (INDEPENDENT_AMBULATORY_CARE_PROVIDER_SITE_OTHER): Payer: Self-pay

## 2014-05-27 DIAGNOSIS — D649 Anemia, unspecified: Secondary | ICD-10-CM | POA: Diagnosis not present

## 2014-05-27 DIAGNOSIS — R609 Edema, unspecified: Secondary | ICD-10-CM | POA: Diagnosis not present

## 2014-05-31 ENCOUNTER — Ambulatory Visit (INDEPENDENT_AMBULATORY_CARE_PROVIDER_SITE_OTHER): Payer: Medicare Other | Admitting: *Deleted

## 2014-05-31 ENCOUNTER — Encounter (INDEPENDENT_AMBULATORY_CARE_PROVIDER_SITE_OTHER): Payer: Self-pay

## 2014-05-31 VITALS — BP 130/80 | HR 72 | Temp 99.5°F | Resp 14 | Ht 62.0 in | Wt 142.8 lb

## 2014-05-31 DIAGNOSIS — C50411 Malignant neoplasm of upper-outer quadrant of right female breast: Secondary | ICD-10-CM

## 2014-05-31 NOTE — Progress Notes (Signed)
Pt came in today for a drain removal.  Pt s/p right mastectomy.  Pt had record of drainage less than 20 ml for more than 3 days.  I removed the suture from the drain.  I opened the bulb and asked the pt to take a deep breath and on the count of 3 I pulled the drain.  I dressed the wound with dry guaze.  Advised pt she can now shower and if she noticed any redness or infection to call the office.  Pt verbalized understanding.  Alisha [name]

## 2014-06-07 ENCOUNTER — Telehealth (INDEPENDENT_AMBULATORY_CARE_PROVIDER_SITE_OTHER): Payer: Self-pay

## 2014-06-07 NOTE — Telephone Encounter (Signed)
Pt's post op appt with Dr. Barry Dienes has been rescheduled to 07/05/14 at 11:00 a.m.  Office on 06/28/14 cancelled.

## 2014-06-10 DIAGNOSIS — N183 Chronic kidney disease, stage 3 unspecified: Secondary | ICD-10-CM | POA: Diagnosis not present

## 2014-06-28 ENCOUNTER — Encounter (INDEPENDENT_AMBULATORY_CARE_PROVIDER_SITE_OTHER): Payer: 59 | Admitting: General Surgery

## 2014-07-05 ENCOUNTER — Ambulatory Visit (INDEPENDENT_AMBULATORY_CARE_PROVIDER_SITE_OTHER): Payer: Medicare Other | Admitting: General Surgery

## 2014-07-05 ENCOUNTER — Encounter (INDEPENDENT_AMBULATORY_CARE_PROVIDER_SITE_OTHER): Payer: Self-pay | Admitting: General Surgery

## 2014-07-05 ENCOUNTER — Other Ambulatory Visit (INDEPENDENT_AMBULATORY_CARE_PROVIDER_SITE_OTHER): Payer: Self-pay | Admitting: General Surgery

## 2014-07-05 VITALS — BP 122/80 | HR 77 | Temp 97.5°F | Ht 62.0 in | Wt 144.0 lb

## 2014-07-05 DIAGNOSIS — C50419 Malignant neoplasm of upper-outer quadrant of unspecified female breast: Secondary | ICD-10-CM

## 2014-07-05 DIAGNOSIS — C50911 Malignant neoplasm of unspecified site of right female breast: Secondary | ICD-10-CM

## 2014-07-05 DIAGNOSIS — C50411 Malignant neoplasm of upper-outer quadrant of right female breast: Secondary | ICD-10-CM

## 2014-07-05 NOTE — Assessment & Plan Note (Addendum)
Physical therapy evaluation for tightness and swelling.    Refer to oncology.  Follow up in 3 months.

## 2014-07-05 NOTE — Patient Instructions (Addendum)
Follow up with me in 3 months.  We will refer you to physical therapy and to oncology.

## 2014-07-05 NOTE — Progress Notes (Signed)
HISTORY: Pt is around 2 months s/p right mastectomy with SLN bx for large area of DCIS.  Drains were pulled out awhile ago.  She is having some heaviness in the right axillary region.  She denies pain.  She is doing ok other than this.  She has not yet seen oncology    EXAM: General:  Alert and oriented.   Incision:  Healing well.  Some redundant skin on lower flap.  No seroma in flap or axilla present.     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 - NEGATIVE FOR EPITHELIAL DYSPLASIA OR MALIGNANCY. - NEGATIVE FOR CHOLELITHIASIS. - INCIDENTAL BENIGN LIVER.   ASSESSMENT AND PLAN:   Breast cancer of upper-outer quadrant of right female breast Physical therapy evaluation for tightness and swelling.    Refer to oncology.  Follow up in 3 months.        Milus Height, MD Surgical Oncology, [facility]  Osborne Casco, MD Kelton Pillar, MD

## 2014-07-08 ENCOUNTER — Telehealth: Payer: Self-pay | Admitting: *Deleted

## 2014-07-08 NOTE — Telephone Encounter (Signed)
Called and confirmed 08/04/14 appt w/ pt.  Mailed before appt letter, welcoming packet & intake form to pt.  Emailed Jearld Fenton and [employer] [facility] [employer] to make them aware.

## 2014-07-12 DIAGNOSIS — E7800 Pure hypercholesterolemia, unspecified: Secondary | ICD-10-CM | POA: Diagnosis not present

## 2014-07-12 DIAGNOSIS — E1129 Type 2 diabetes mellitus with other diabetic kidney complication: Secondary | ICD-10-CM | POA: Diagnosis not present

## 2014-07-12 DIAGNOSIS — M810 Age-related osteoporosis without current pathological fracture: Secondary | ICD-10-CM | POA: Diagnosis not present

## 2014-07-12 DIAGNOSIS — D62 Acute posthemorrhagic anemia: Secondary | ICD-10-CM | POA: Diagnosis not present

## 2014-07-12 DIAGNOSIS — I10 Essential (primary) hypertension: Secondary | ICD-10-CM | POA: Diagnosis not present

## 2014-07-12 DIAGNOSIS — N183 Chronic kidney disease, stage 3 unspecified: Secondary | ICD-10-CM | POA: Diagnosis not present

## 2014-07-12 DIAGNOSIS — D510 Vitamin B12 deficiency anemia due to intrinsic factor deficiency: Secondary | ICD-10-CM | POA: Diagnosis not present

## 2014-07-12 DIAGNOSIS — D0590 Unspecified type of carcinoma in situ of unspecified breast: Secondary | ICD-10-CM | POA: Diagnosis not present

## 2014-07-12 DIAGNOSIS — E042 Nontoxic multinodular goiter: Secondary | ICD-10-CM | POA: Diagnosis not present

## 2014-07-13 ENCOUNTER — Ambulatory Visit: Payer: Medicare Other | Attending: General Surgery | Admitting: Physical Therapy

## 2014-07-13 DIAGNOSIS — IMO0001 Reserved for inherently not codable concepts without codable children: Secondary | ICD-10-CM | POA: Insufficient documentation

## 2014-07-13 DIAGNOSIS — R293 Abnormal posture: Secondary | ICD-10-CM | POA: Insufficient documentation

## 2014-07-13 DIAGNOSIS — Z9181 History of falling: Secondary | ICD-10-CM | POA: Insufficient documentation

## 2014-07-13 DIAGNOSIS — C50919 Malignant neoplasm of unspecified site of unspecified female breast: Secondary | ICD-10-CM | POA: Diagnosis not present

## 2014-07-19 DIAGNOSIS — E119 Type 2 diabetes mellitus without complications: Secondary | ICD-10-CM | POA: Diagnosis not present

## 2014-07-19 DIAGNOSIS — H25019 Cortical age-related cataract, unspecified eye: Secondary | ICD-10-CM | POA: Diagnosis not present

## 2014-07-19 DIAGNOSIS — H2510 Age-related nuclear cataract, unspecified eye: Secondary | ICD-10-CM | POA: Diagnosis not present

## 2014-07-20 ENCOUNTER — Ambulatory Visit: Payer: Medicare Other | Attending: General Surgery | Admitting: Physical Therapy

## 2014-07-20 DIAGNOSIS — IMO0001 Reserved for inherently not codable concepts without codable children: Secondary | ICD-10-CM | POA: Insufficient documentation

## 2014-07-20 DIAGNOSIS — R293 Abnormal posture: Secondary | ICD-10-CM | POA: Insufficient documentation

## 2014-07-20 DIAGNOSIS — Z9181 History of falling: Secondary | ICD-10-CM | POA: Diagnosis not present

## 2014-07-20 DIAGNOSIS — C50919 Malignant neoplasm of unspecified site of unspecified female breast: Secondary | ICD-10-CM | POA: Insufficient documentation

## 2014-07-21 ENCOUNTER — Other Ambulatory Visit (INDEPENDENT_AMBULATORY_CARE_PROVIDER_SITE_OTHER): Payer: Self-pay

## 2014-07-21 MED ORDER — UNABLE TO FIND: Status: AC

## 2014-07-22 ENCOUNTER — Ambulatory Visit: Payer: Medicare Other | Admitting: Physical Therapy

## 2014-07-27 ENCOUNTER — Encounter: Payer: Medicare Other | Admitting: Physical Therapy

## 2014-07-29 ENCOUNTER — Encounter: Payer: Medicare Other | Admitting: Physical Therapy

## 2014-08-03 ENCOUNTER — Ambulatory Visit: Payer: Medicare Other | Admitting: Physical Therapy

## 2014-08-03 DIAGNOSIS — IMO0001 Reserved for inherently not codable concepts without codable children: Secondary | ICD-10-CM | POA: Diagnosis not present

## 2014-08-04 ENCOUNTER — Ambulatory Visit: Payer: Medicare Other

## 2014-08-04 ENCOUNTER — Telehealth: Payer: Self-pay | Admitting: Hematology and Oncology

## 2014-08-04 ENCOUNTER — Encounter: Payer: Self-pay | Admitting: Hematology and Oncology

## 2014-08-04 ENCOUNTER — Ambulatory Visit (HOSPITAL_BASED_OUTPATIENT_CLINIC_OR_DEPARTMENT_OTHER): Payer: Medicare Other | Admitting: Hematology and Oncology

## 2014-08-04 VITALS — BP 162/69 | HR 63 | Temp 98.3°F | Resp 20 | Ht 62.0 in | Wt 147.1 lb

## 2014-08-04 DIAGNOSIS — Z171 Estrogen receptor negative status [ER-]: Secondary | ICD-10-CM | POA: Diagnosis not present

## 2014-08-04 DIAGNOSIS — Z9010 Acquired absence of unspecified breast and nipple: Secondary | ICD-10-CM

## 2014-08-04 DIAGNOSIS — Z86718 Personal history of other venous thrombosis and embolism: Secondary | ICD-10-CM

## 2014-08-04 DIAGNOSIS — C50411 Malignant neoplasm of upper-outer quadrant of right female breast: Secondary | ICD-10-CM

## 2014-08-04 DIAGNOSIS — D0590 Unspecified type of carcinoma in situ of unspecified breast: Secondary | ICD-10-CM | POA: Diagnosis not present

## 2014-08-04 NOTE — Telephone Encounter (Signed)
per pof to sch pt appt-will mail pt copy of sch

## 2014-08-04 NOTE — Progress Notes (Signed)
[facility] CONSULT NOTE  Patient Care Team: Kelton Pillar, MD as PCP - General (Family Medicine)  CHIEF COMPLAINTS/PURPOSE OF CONSULTATION:  Newly diagnosed right breast DCIS  HISTORY OF PRESENTING ILLNESS:  Alisha [name] 80 y.o. female is here because of recent diagnosis of right breast DCIS. She has had regular screening mammograms of the past couple of years there were some calcifications noted which were being followed every 6 months but finally when the calcifications increased as of 04/27/2014 she had an MRI that showed a 5 cm area of calcifications. Initial diagnosis revealed DCIS ER/PR negative. She underwent right breast mastectomy on 05/13/2014 that revealed high-grade DCIS 4.5 cm in size margins were all negative estrogen progesterone were negative as well. She has recovered very well from the surgery and is here today to discuss adjuvant therapy options. She is by herself. Denies any new complaints or concerns. The breast is healing excellently. 18  I reviewed her records extensively and collaborated the history with the patient.  SUMMARY OF ONCOLOGIC HISTORY:   Breast cancer of upper-outer quadrant of right female breast   04/27/2014 Breast MRI 5X4X3 cm calcifications   05/03/2014 Initial Diagnosis DCIS with calcifications ER 0%, PR 0%   05/13/2014 Surgery Right breast mastectomy: High grade DCIS, 4.5 cm, Margins neg, ER0%, PR 0%    In terms of breast cancer risk profile:  She menarched at early age of 20 and went to menopause at age 9  She had 1 pregnancy, her first child was born at age 34  She has not received birth control pills.  She was never exposed to fertility medications or hormone replacement therapy.  She has no family history of Breast/GYN/GI cancer  MEDICAL HISTORY:  Past Medical History  Diagnosis Date  . Arthritis   . Hyperlipidemia     takes Zetia daily  . B12 deficiency     takes Vit B12 Q3 months  . Colon polyps   . Multiple thyroid  nodules     takes Synthroid daily  . PONV (postoperative nausea and vomiting)   . Peripheral vascular disease     DVT  2008 with ? anticoagulation  . Blood transfusion   . Hypertension     takes Diovan and Metoprolol daily  . Diabetes mellitus     takes Metformin and Glipizide daily  . Anemia     takes Ferrous Sulfate daily  . Joint pain   . Osteoporosis     takes Fosamax every Wed  . Hemorrhoids   . History of blood transfusion     no abnormal reaction noted    SURGICAL HISTORY: Past Surgical History  Procedure Laterality Date  . Femur surgery Left 07/08/11    plates and screws  . Fracture surgery      8/12 femur fx with rod and bolt placed  . Hernia repair  1945  . Thyroidectomy  12/04/2011    Procedure: THYROIDECTOMY;  Surgeon: Earnstine Regal, MD;  Location: [facility];  Service: General;  Laterality: N/A;  total thyroidectomy  . Hemorrhoidectomy with hemorrhoid banding    . Mastectomy w/ sentinel node biopsy Right 05/13/2014    Procedure: MASTECTOMY WITH SENTINEL LYMPH NODE BIOPSY;  Surgeon: Stark Klein, MD;  Location: [facility];  Service: General;  Laterality: Right;    SOCIAL HISTORY: History   Social History  . Marital Status: Widowed    Spouse Name: N/A    Number of Children: N/A  . Years of Education: N/A  Occupational History  . Not on file.   Social History Main Topics  . Smoking status: Never Smoker   . Smokeless tobacco: Never Used  . Alcohol Use: No  . Drug Use: No  . Sexual Activity: No   Other Topics Concern  . Not on file   Social History Narrative  . No narrative on file    FAMILY HISTORY: No family history of breast cancer No family history on file.  ALLERGIES:  is allergic to ace inhibitors; evista; and zocor.  MEDICATIONS:  Current Outpatient Prescriptions  Medication Sig Dispense Refill  . acetaminophen (TYLENOL) 500 MG tablet Take 500 mg by mouth every 6 (six) hours as needed. Pain       . alendronate (FOSAMAX) 70 MG tablet Take 70  mg by mouth once a week. Take with a full glass of water on an empty stomach.      Marland Kitchen aspirin 81 MG tablet Take 81 mg by mouth daily before breakfast.       . Calcium Carb-Cholecalciferol (CALCIUM 600 + D) 600-200 MG-UNIT TABS Take 1 tablet by mouth daily.      . cyanocobalamin (,VITAMIN B-12,) 1000 MCG/ML injection Inject 1,000 mcg into the muscle every 3 (three) months.      . Ferrous Sulfate (IRON) 90 (18 FE) MG TABS Take 1 tablet by mouth daily. 150 mg      . GLIPIZIDE XL 5 MG 24 hr tablet Take 5 mg by mouth daily before breakfast.       . levothyroxine (SYNTHROID, LEVOTHROID) 100 MCG tablet Take 100 mcg by mouth daily before breakfast.      . metFORMIN (GLUCOPHAGE) 850 MG tablet Take 850 mg by mouth 3 (three) times daily.       . metoprolol (TOPROL-XL) 100 MG 24 hr tablet Take 100 mg by mouth daily before breakfast.       . triamcinolone (KENALOG) 0.1 % paste       . UNABLE TO FIND 174.9 Right Mastectomy   L8000- Post Surgical Bras-6  B7628- Silicone Breast Prosthesis-1  1 each  0  . valsartan-hydrochlorothiazide (DIOVAN-HCT) 160-25 MG per tablet Take 1 tablet by mouth daily before breakfast.       . ZETIA 10 MG tablet Take 10 mg by mouth daily before breakfast.        No current facility-administered medications for this visit.    REVIEW OF SYSTEMS:   Constitutional: Denies fevers, chills or abnormal night sweats Eyes: Denies blurriness of vision, double vision or watery eyes Ears, nose, mouth, throat, and face: Denies mucositis or sore throat Respiratory: Denies cough, dyspnea or wheezes Cardiovascular: Denies palpitation, chest discomfort or lower extremity swelling Gastrointestinal:  Denies nausea, heartburn or change in bowel habits Skin: Denies abnormal skin rashes Lymphatics: Denies new lymphadenopathy or easy bruising Neurological:Denies numbness, tingling or new weaknesses, because of length difference in both her legs she has a wobbly gait and needs a  cane Behavioral/Psych: Mood is stable, no new changes  Breast: Right-sided chest healed very well for mastectomy All other systems were reviewed with the patient and are negative.  PHYSICAL EXAMINATION: ECOG PERFORMANCE STATUS: 1 - Symptomatic but completely ambulatory  Filed Vitals:   08/04/14 0810  BP: 162/69  Pulse: 63  Temp: 98.3 F (36.8 C)  Resp: 20   Filed Weights   08/04/14 0810  Weight: 147 lb 1.6 oz (66.724 kg)    GENERAL:alert, no distress and comfortable SKIN: skin color, texture, turgor are normal, no  rashes or significant lesions EYES: normal, conjunctiva are pink and non-injected, sclera clear OROPHARYNX:no exudate, no erythema and lips, buccal mucosa, and tongue normal  NECK: supple, thyroid normal size, non-tender, without nodularity LYMPH:  no palpable lymphadenopathy in the cervical, axillary or inguinal LUNGS: clear to auscultation and percussion with normal breathing effort HEART: regular rate & rhythm and no murmurs and no lower extremity edema ABDOMEN:abdomen soft, non-tender and normal bowel sounds Musculoskeletal:no cyanosis of digits and no clubbing  PSYCH: alert & oriented x 3 with fluent speech NEURO: no focal motor/sensory deficits, wobbly gait because of difference in leg size needs a cane to walk  LABORATORY DATA:  I have reviewed the data as listed Lab Results  Component Value Date   WBC 8.1 05/14/2014   HGB 8.1* 05/14/2014   HCT 26.3* 05/14/2014   MCV 69.4* 05/14/2014   PLT 286 05/14/2014   Lab Results  Component Value Date   NA 133* 05/14/2014   K 4.4 05/14/2014   CL 94* 05/14/2014   CO2 27 05/14/2014    RADIOGRAPHIC STUDIES: I have personally reviewed the radiological reports and agreed with the findings in the report.  ASSESSMENT AND PLAN:  Breast cancer of upper-outer quadrant of right female breast Right breast DCIS high-grade 0.5 cm ER/PR negative status post right breast mastectomy: Discussed with the patient, the details of  pathology including the significance of ER, and PR  receptors and the implications for treatment. After reviewing the pathology in detail, we proceeded to discuss that there is no role of adjuvant antiestrogen therapy. Given the fact that she had mastectomy there was no role for adjuvant radiation therapy. We will follow her with periodic physical exams and mammograms on the left breast.  Discussed the importance of physical exercise in decreasing the likelihood of breast cancer recurrence. Recommended 30 mins daily 5 days a week of either brisk walking or cycling or swimming. Encouraged patient to eat more fruits and vegetables and decrease red meat.   Patient is very active with [facility] [employer] group and enjoys some social activities with them.   All questions were answered. The patient knows to call the clinic with any problems, questions or concerns. I spent 55 minutes counseling the patient face to face. The total time spent in the appointment was 60 minutes and more than 50% was on counseling.     Rulon Eisenmenger, MD 08/04/2014 9:17 AM

## 2014-08-04 NOTE — Progress Notes (Signed)
Checked in new patient with no financial issues prior to seeing the dr. She has appt card and [facility] packet. She [place] not been [place] of the [place]

## 2014-08-04 NOTE — Assessment & Plan Note (Signed)
Right breast DCIS high-grade 0.5 cm ER/PR negative status post right breast mastectomy: Discussed with the patient, the details of pathology including the significance of ER, and PR  receptors and the implications for treatment. After reviewing the pathology in detail, we proceeded to discuss that there is no role of adjuvant antiestrogen therapy. Given the fact that she had mastectomy there was no role for adjuvant radiation therapy. We will follow her with periodic physical exams and mammograms on the left breast.

## 2014-08-06 NOTE — Progress Notes (Signed)
MD note created during office visit - copy to pt, original to scan.

## 2014-08-10 ENCOUNTER — Encounter: Payer: Medicare Other | Admitting: Physical Therapy

## 2014-08-12 ENCOUNTER — Encounter: Payer: Medicare Other | Admitting: Physical Therapy

## 2014-09-13 DIAGNOSIS — D649 Anemia, unspecified: Secondary | ICD-10-CM | POA: Diagnosis not present

## 2014-09-13 DIAGNOSIS — Z23 Encounter for immunization: Secondary | ICD-10-CM | POA: Diagnosis not present

## 2014-10-04 DIAGNOSIS — C50411 Malignant neoplasm of upper-outer quadrant of right female breast: Secondary | ICD-10-CM | POA: Diagnosis not present

## 2014-10-11 DIAGNOSIS — D510 Vitamin B12 deficiency anemia due to intrinsic factor deficiency: Secondary | ICD-10-CM | POA: Diagnosis not present

## 2014-10-13 DIAGNOSIS — D649 Anemia, unspecified: Secondary | ICD-10-CM | POA: Diagnosis not present

## 2014-10-27 DIAGNOSIS — D509 Iron deficiency anemia, unspecified: Secondary | ICD-10-CM | POA: Diagnosis not present

## 2014-10-27 DIAGNOSIS — D510 Vitamin B12 deficiency anemia due to intrinsic factor deficiency: Secondary | ICD-10-CM | POA: Diagnosis not present

## 2014-10-27 DIAGNOSIS — R195 Other fecal abnormalities: Secondary | ICD-10-CM | POA: Diagnosis not present

## 2014-10-28 ENCOUNTER — Other Ambulatory Visit (HOSPITAL_COMMUNITY): Payer: Self-pay | Admitting: *Deleted

## 2014-10-28 ENCOUNTER — Encounter (HOSPITAL_COMMUNITY)
Admission: RE | Admit: 2014-10-28 | Discharge: 2014-10-28 | Disposition: A | Discharge status: Home / Self Care | Payer: Medicare Other | Source: Ambulatory Visit | Attending: Gastroenterology | Admitting: Gastroenterology

## 2014-10-28 DIAGNOSIS — D510 Vitamin B12 deficiency anemia due to intrinsic factor deficiency: Secondary | ICD-10-CM | POA: Insufficient documentation

## 2014-10-28 LAB — PREPARE RBC (CROSSMATCH)

## 2014-10-29 ENCOUNTER — Encounter (HOSPITAL_COMMUNITY)
Admission: RE | Admit: 2014-10-29 | Discharge: 2014-10-29 | Disposition: A | Discharge status: Home / Self Care | Payer: Medicare Other | Source: Ambulatory Visit | Attending: Gastroenterology | Admitting: Gastroenterology

## 2014-10-29 DIAGNOSIS — D510 Vitamin B12 deficiency anemia due to intrinsic factor deficiency: Secondary | ICD-10-CM | POA: Diagnosis not present

## 2014-10-29 LAB — PREPARE RBC (CROSSMATCH)

## 2014-10-29 MED ORDER — SODIUM CHLORIDE 0.9 % IV SOLN: Freq: Once | INTRAVENOUS | Status: DC

## 2014-10-30 LAB — TYPE AND SCREEN
ABO/RH(D): A POS
Antibody Screen: NEGATIVE
UNIT DIVISION: 0
Unit division: 0

## 2014-11-03 ENCOUNTER — Other Ambulatory Visit: Payer: Self-pay | Admitting: Gastroenterology

## 2014-11-03 DIAGNOSIS — D509 Iron deficiency anemia, unspecified: Secondary | ICD-10-CM | POA: Diagnosis not present

## 2014-11-03 DIAGNOSIS — R195 Other fecal abnormalities: Secondary | ICD-10-CM | POA: Diagnosis not present

## 2014-11-03 DIAGNOSIS — D510 Vitamin B12 deficiency anemia due to intrinsic factor deficiency: Secondary | ICD-10-CM | POA: Diagnosis not present

## 2014-11-25 ENCOUNTER — Encounter (HOSPITAL_COMMUNITY): Payer: Self-pay | Admitting: *Deleted

## 2014-11-29 ENCOUNTER — Other Ambulatory Visit: Payer: Self-pay | Admitting: Gastroenterology

## 2014-12-06 ENCOUNTER — Ambulatory Visit (HOSPITAL_COMMUNITY)
Admission: RE | Admit: 2014-12-06 | Discharge: 2014-12-06 | Disposition: A | Discharge status: Home / Self Care | Payer: Medicare Other | Source: Ambulatory Visit | Attending: Gastroenterology | Admitting: Gastroenterology

## 2014-12-06 ENCOUNTER — Encounter (HOSPITAL_COMMUNITY): Payer: Self-pay

## 2014-12-06 ENCOUNTER — Ambulatory Visit (HOSPITAL_COMMUNITY): Payer: Medicare Other | Admitting: Anesthesiology

## 2014-12-06 ENCOUNTER — Encounter (HOSPITAL_COMMUNITY)
Admission: RE | Disposition: A | Discharge status: Home / Self Care | Payer: Self-pay | Source: Ambulatory Visit | Attending: Gastroenterology

## 2014-12-06 DIAGNOSIS — E780 Pure hypercholesterolemia: Secondary | ICD-10-CM | POA: Insufficient documentation

## 2014-12-06 DIAGNOSIS — D509 Iron deficiency anemia, unspecified: Secondary | ICD-10-CM | POA: Insufficient documentation

## 2014-12-06 DIAGNOSIS — K921 Melena: Secondary | ICD-10-CM | POA: Diagnosis not present

## 2014-12-06 DIAGNOSIS — K317 Polyp of stomach and duodenum: Secondary | ICD-10-CM | POA: Diagnosis not present

## 2014-12-06 DIAGNOSIS — Z853 Personal history of malignant neoplasm of breast: Secondary | ICD-10-CM | POA: Insufficient documentation

## 2014-12-06 DIAGNOSIS — D510 Vitamin B12 deficiency anemia due to intrinsic factor deficiency: Secondary | ICD-10-CM | POA: Diagnosis not present

## 2014-12-06 DIAGNOSIS — N183 Chronic kidney disease, stage 3 (moderate): Secondary | ICD-10-CM | POA: Diagnosis not present

## 2014-12-06 DIAGNOSIS — D122 Benign neoplasm of ascending colon: Secondary | ICD-10-CM | POA: Insufficient documentation

## 2014-12-06 DIAGNOSIS — D123 Benign neoplasm of transverse colon: Secondary | ICD-10-CM | POA: Diagnosis not present

## 2014-12-06 DIAGNOSIS — K2950 Unspecified chronic gastritis without bleeding: Secondary | ICD-10-CM | POA: Insufficient documentation

## 2014-12-06 DIAGNOSIS — I739 Peripheral vascular disease, unspecified: Secondary | ICD-10-CM | POA: Diagnosis not present

## 2014-12-06 DIAGNOSIS — I129 Hypertensive chronic kidney disease with stage 1 through stage 4 chronic kidney disease, or unspecified chronic kidney disease: Secondary | ICD-10-CM | POA: Insufficient documentation

## 2014-12-06 DIAGNOSIS — E119 Type 2 diabetes mellitus without complications: Secondary | ICD-10-CM | POA: Diagnosis not present

## 2014-12-06 DIAGNOSIS — E039 Hypothyroidism, unspecified: Secondary | ICD-10-CM | POA: Diagnosis not present

## 2014-12-06 DIAGNOSIS — R195 Other fecal abnormalities: Secondary | ICD-10-CM | POA: Diagnosis not present

## 2014-12-06 DIAGNOSIS — K635 Polyp of colon: Secondary | ICD-10-CM | POA: Diagnosis not present

## 2014-12-06 HISTORY — PX: COLONOSCOPY WITH PROPOFOL: SHX5780

## 2014-12-06 HISTORY — PX: ESOPHAGOGASTRODUODENOSCOPY (EGD) WITH PROPOFOL: SHX5813

## 2014-12-06 LAB — CBC
HEMATOCRIT: 28.5 % — AB (ref 36.0–46.0)
Hemoglobin: 8.6 g/dL — ABNORMAL LOW (ref 12.0–15.0)
MCH: 21.8 pg — ABNORMAL LOW (ref 26.0–34.0)
MCHC: 30.2 g/dL (ref 30.0–36.0)
MCV: 72.2 fL — ABNORMAL LOW (ref 78.0–100.0)
Platelets: 335 10*3/uL (ref 150–400)
RBC: 3.95 MIL/uL (ref 3.87–5.11)
RDW: 21.8 % — ABNORMAL HIGH (ref 11.5–15.5)
WBC: 6.2 10*3/uL (ref 4.0–10.5)

## 2014-12-06 SURGERY — ESOPHAGOGASTRODUODENOSCOPY (EGD) WITH PROPOFOL
Anesthesia: Monitor Anesthesia Care

## 2014-12-06 MED ORDER — PROPOFOL 10 MG/ML IV BOLUS
INTRAVENOUS | Status: DC | PRN
  Administered 2014-12-06 (×6): 50 mg via INTRAVENOUS

## 2014-12-06 MED ORDER — LACTATED RINGERS IV SOLN: INTRAVENOUS | Status: DC

## 2014-12-06 MED ORDER — SODIUM CHLORIDE 0.9 % IV SOLN: INTRAVENOUS | Status: DC

## 2014-12-06 MED ORDER — OXYCODONE HCL 5 MG PO TABS: 5.0000 mg | ORAL_TABLET | Freq: Once | ORAL | Status: DC | PRN

## 2014-12-06 MED ORDER — METOPROLOL TARTRATE 1 MG/ML IV SOLN
INTRAVENOUS | Status: DC | PRN
Start: 2014-12-06 — End: 2014-12-06
  Administered 2014-12-06 (×2): 2.5 mg via INTRAVENOUS

## 2014-12-06 MED ORDER — PROMETHAZINE HCL 25 MG/ML IJ SOLN: 6.2500 mg | INTRAMUSCULAR | Status: DC | PRN

## 2014-12-06 MED ORDER — PROPOFOL 10 MG/ML IV BOLUS
INTRAVENOUS | Status: AC
  Filled 2014-12-06: qty 20

## 2014-12-06 MED ORDER — LACTATED RINGERS IV SOLN
INTRAVENOUS | Status: DC | PRN
  Administered 2014-12-06: 07:00:00 via INTRAVENOUS

## 2014-12-06 MED ORDER — METOPROLOL TARTRATE 1 MG/ML IV SOLN
INTRAVENOUS | Status: AC
  Filled 2014-12-06: qty 5

## 2014-12-06 MED ORDER — BUTAMBEN-TETRACAINE-BENZOCAINE 2-2-14 % EX AERO
INHALATION_SPRAY | CUTANEOUS | Status: DC | PRN
  Administered 2014-12-06: 1 via TOPICAL

## 2014-12-06 MED ORDER — HYDROMORPHONE HCL 1 MG/ML IJ SOLN: 0.2500 mg | INTRAMUSCULAR | Status: DC | PRN

## 2014-12-06 MED ORDER — OXYCODONE HCL 5 MG/5ML PO SOLN: 5.0000 mg | Freq: Once | ORAL | Status: DC | PRN

## 2014-12-06 SURGICAL SUPPLY — 24 items

## 2014-12-06 NOTE — Transfer of Care (Signed)
Immediate Anesthesia Transfer of Care Note  Patient: Alisha [name]  Procedure(s) Performed: Procedure(s): ESOPHAGOGASTRODUODENOSCOPY (EGD) WITH PROPOFOL (N/A) COLONOSCOPY WITH PROPOFOL (N/A)  Patient Location: PACU  Anesthesia Type:MAC  Level of Consciousness: awake, sedated and patient cooperative  Airway & Oxygen Therapy: Patient Spontanous Breathing and Patient connected to face mask oxygen  Post-op Assessment: Report given to PACU RN and Post -op Vital signs reviewed and stable  Post vital signs: Reviewed and stable  Complications: No apparent anesthesia complications

## 2014-12-06 NOTE — H&P (Signed)
  Problem: Iron deficiency anemia, pernicious anemia, and heme positive stool. 2003 esophagogastroduodenoscopy and colonoscopy performed to evaluate iron deficiency anemia  History: The patient is an 81 year old female born 12/29/1931. She has developed iron deficiency anemia with heme positive stool. On 10/29/2014, the patient received 2 units packed red blood cell transfusion when her hemoglobin returned 6.1 g. The patient is no longer taking aspirin.  She is scheduled to undergo diagnostic esophagogastroduodenoscopy and colonoscopy.  Past medical history: Hypertension. Pernicious anemia. Type 2 diabetes mellitus. Osteopenia. Hypercholesterolemia. Stage III chronic kidney disease. Breast cancer. Thyroidectomy. Hip fracture surgery. Hemorrhoidectomy. Hypothyroidism. History of colon polyps.  Medication allergies: ACE inhibitors cause cough. Statins cause muscle cramps.  Exam: The patient is alert and lying comfortably on the endoscopy stretcher. Abdomen is soft and nontender to palpation. Lungs are clear to auscultation. Cardiac exam reveals was rhythm.  Plan: Proceed with diagnostic esophagogastroduodenoscopy

## 2014-12-06 NOTE — Op Note (Signed)
Problems: Iron deficiency anemia, pernicious anemia, and heme positive stool on aspirin. 11/08/2002 esophagogastroduodenoscopy and colonoscopy to evaluate iron deficiency anemia: Cecal AVM endoclipped  Endoscopist: Earle Gell  Premedication: Propofol administered by anesthesia  Procedure: Diagnostic esophagogastroduodenoscopy with removal of a small gastric polyp The patient was placed in the left lateral decubitus position. The Pentax gastroscope was passed through the posterior hypopharynx into the proximal esophagus without difficulty. The hypopharynx, larynx, and vocal cords appeared normal.  Esophagoscopy: The proximal, mid, and lower segments of the esophageal mucosa appeared normal. The squamocolumnar junction was noted at 36 cm from the incisor teeth.  Gastroscopy: Retroflex view of the gastric cardia and fundus was normal. A 5 mm sessile polyp in the mid gastric body anterior wall was removed with the cold biopsy forceps. Otherwise the gastric body, antrum, and pylorus appeared normal.  Duodenoscopy: The duodenal bulb and descending duodenum appeared normal.  Assessment: A 5 mm sessile gastric body polyp was removed with the cold biopsy forceps. Otherwise normal esophagogastroduodenoscopy.  Procedure: Diagnostic colonoscopy with removal of a small ascending colon polyp and mid transverse colon polyp Anal inspection and digital rectal exam were normal. The Pentax pediatric colonoscope was introduced into the rectum and advanced to the cecum. A normal-appearing appendiceal orifice was identified. A normal-appearing ileocecal valve was intubated and the terminal ileum inspected. Colonic preparation for the exam today was good.  Rectum. Normal. Retroflexed view of the distal rectum normal  Sigmoid colon and descending colon. Normal  Splenic flexure. Normal  Transverse colon. From the mid transverse colon, a 5 mm sessile polyp was removed with the cold snare.  Hepatic flexure.  Normal  Ascending colon. From the proximal ascending colon, a 3 mm sessile polyp was removed with the cold biopsy forceps  Cecum and ileocecal valve. Normal  Terminal ileum. Normal  Assessment: A 3 mm sessile polyp was removed from the proximal ascending colon and a 5 mm sessile polyp was removed from the mid transverse colon. Otherwise normal colonoscopy.  Plan: I will check the patient's hemoglobin level today to determine if she should undergo a CT enterography or capsule endoscopy to evaluate iron deficiency anemia and heme-positive stool.

## 2014-12-06 NOTE — Anesthesia Postprocedure Evaluation (Signed)
Anesthesia Post Note  Patient: Alisha [name]  Procedure(s) Performed: Procedure(s) (LRB): ESOPHAGOGASTRODUODENOSCOPY (EGD) WITH PROPOFOL (N/A) COLONOSCOPY WITH PROPOFOL (N/A)  Anesthesia type: MAC  Patient location: PACU  Post pain: Pain level controlled  Post assessment: Patient's Cardiovascular Status Stable  Last Vitals:  Filed Vitals:   12/06/14 0845  BP:   Pulse: 71  Temp:   Resp: 18    Post vital signs: Reviewed and stable  Level of consciousness: sedated  Complications: No apparent anesthesia complications

## 2014-12-06 NOTE — Discharge Instructions (Signed)
Colonoscopy, Care After °These instructions give you information on caring for yourself after your procedure. Your doctor may also give you more specific instructions. Call your doctor if you have any problems or questions after your procedure. °HOME CARE °· Do not drive for 24 hours. °· Do not sign important papers or use machinery for 24 hours. °· You may shower. °· You may go back to your usual activities, but go slower for the first 24 hours. °· Take rest breaks often during the first 24 hours. °· Walk around or use warm packs on your belly (abdomen) if you have belly cramping or gas. °· Drink enough fluids to keep your pee (urine) clear or pale yellow. °· Resume your normal diet. Avoid heavy or fried foods. °· Avoid drinking alcohol for 24 hours or as told by your doctor. °· Only take medicines as told by your doctor. °If a tissue sample (biopsy) was taken during the procedure:  °· Do not take aspirin or blood thinners for 7 days, or as told by your doctor. °· Do not drink alcohol for 7 days, or as told by your doctor. °· Eat soft foods for the first 24 hours. °GET HELP IF: °You still have a small amount of blood in your poop (stool) 2-3 days after the procedure. °GET HELP RIGHT AWAY IF: °· You have more than a small amount of blood in your poop. °· You see clumps of tissue (blood clots) in your poop. °· Your belly is puffy (swollen). °· You feel sick to your stomach (nauseous) or throw up (vomit). °· You have a fever. °· You have belly pain that gets worse and medicine does not help. °MAKE SURE YOU: °· Understand these instructions. °· Will watch your condition. °· Will get help right away if you are not doing well or get worse. °Document Released: 12/08/2010 Document Revised: 11/10/2013 Document Reviewed: 07/13/2013 °ExitCare® Patient Information ©2015 [employer]. This information is not intended to replace advice given to you by your health care provider. Make sure you discuss any questions you have with  your health care provider. °Esophagogastroduodenoscopy °Care After °Refer to this sheet in the next few weeks. These instructions provide you with information on caring for yourself after your procedure. Your caregiver may also give you more specific instructions. Your treatment has been planned according to current medical practices, but problems sometimes occur. Call your caregiver if you have any problems or questions after your procedure.  °HOME CARE INSTRUCTIONS °· Do not eat or drink anything until the numbing medicine (local anesthetic) has worn off and your gag reflex has returned. You will know that the local anesthetic has worn off when you can swallow comfortably. °· Do not drive for 12 hours after the procedure or as directed by your caregiver. °· Only take medicines as directed by your caregiver. °SEEK MEDICAL CARE IF:  °· You cannot stop coughing. °· You are not urinating at all or less than usual. °SEEK IMMEDIATE MEDICAL CARE IF: °· You have difficulty swallowing. °· You cannot eat or drink. °· You have worsening throat or chest pain. °· You have dizziness, lightheadedness, or you faint. °· You have nausea or vomiting. °· You have chills. °· You have a fever. °· You have severe abdominal pain. °· You have black, tarry, or bloody stools. °Document Released: 10/22/2012 Document Reviewed: 10/22/2012 °ExitCare® Patient Information ©2015 [employer]. This information is not intended to replace advice given to you by your health care provider. Make sure you   discuss any questions you have with your health care provider. ° °

## 2014-12-06 NOTE — Anesthesia Preprocedure Evaluation (Signed)
Anesthesia Evaluation  Patient identified by MRN, date of birth, ID band Patient awake    Reviewed: Allergy & Precautions, NPO status , Patient's Chart, lab work & pertinent test results  History of Anesthesia Complications (+) PONV and history of anesthetic complications  Airway Mallampati: I  TM Distance: >3 FB Neck ROM: Full    Dental  (+) Edentulous Upper, Partial Lower, Dental Advisory Given   Pulmonary neg pulmonary ROS,    Pulmonary exam normal       Cardiovascular hypertension, Pt. on medications and Pt. on home beta blockers + Peripheral Vascular Disease     Neuro/Psych negative neurological ROS  negative psych ROS   GI/Hepatic Neg liver ROS,   Endo/Other  diabetesHypothyroidism   Renal/GU negative Renal ROS     Musculoskeletal   Abdominal   Peds  Hematology   Anesthesia Other Findings   Reproductive/Obstetrics                             Anesthesia Physical Anesthesia Plan  ASA: III  Anesthesia Plan: MAC   Post-op Pain Management:    Induction: Intravenous  Airway Management Planned: Simple Face Mask  Additional Equipment:   Intra-op Plan:   Post-operative Plan: Extubation in OR  Informed Consent: I have reviewed the patients History and Physical, chart, labs and discussed the procedure including the risks, benefits and alternatives for the proposed anesthesia with the patient or authorized representative who has indicated his/her understanding and acceptance.   Dental advisory given  Plan Discussed with: CRNA, Anesthesiologist and Surgeon  Anesthesia Plan Comments:         Anesthesia Quick Evaluation

## 2014-12-07 ENCOUNTER — Encounter (HOSPITAL_COMMUNITY): Payer: Self-pay | Admitting: Gastroenterology

## 2015-01-12 DIAGNOSIS — R195 Other fecal abnormalities: Secondary | ICD-10-CM | POA: Diagnosis not present

## 2015-01-12 DIAGNOSIS — D509 Iron deficiency anemia, unspecified: Secondary | ICD-10-CM | POA: Diagnosis not present

## 2015-01-12 DIAGNOSIS — D510 Vitamin B12 deficiency anemia due to intrinsic factor deficiency: Secondary | ICD-10-CM | POA: Diagnosis not present

## 2015-01-19 DIAGNOSIS — D510 Vitamin B12 deficiency anemia due to intrinsic factor deficiency: Secondary | ICD-10-CM | POA: Diagnosis not present

## 2015-01-19 DIAGNOSIS — D509 Iron deficiency anemia, unspecified: Secondary | ICD-10-CM | POA: Diagnosis not present

## 2015-01-19 DIAGNOSIS — R195 Other fecal abnormalities: Secondary | ICD-10-CM | POA: Diagnosis not present

## 2015-01-27 ENCOUNTER — Ambulatory Visit (HOSPITAL_COMMUNITY)
Admission: RE | Admit: 2015-01-27 | Discharge: 2015-01-27 | Disposition: A | Discharge status: Home / Self Care | Payer: Medicare Other | Source: Ambulatory Visit | Attending: Gastroenterology | Admitting: Gastroenterology

## 2015-01-27 DIAGNOSIS — D509 Iron deficiency anemia, unspecified: Secondary | ICD-10-CM | POA: Diagnosis not present

## 2015-01-27 MED ORDER — SODIUM CHLORIDE 0.9 % IV SOLN
INTRAVENOUS | Status: DC
  Administered 2015-01-27: 09:00:00 via INTRAVENOUS

## 2015-01-27 MED ORDER — NA FERRIC GLUC CPLX IN SUCROSE 12.5 MG/ML IV SOLN
125.0000 mg | Freq: Once | INTRAVENOUS | Status: DC
  Filled 2015-01-27: qty 10

## 2015-01-27 MED ORDER — SODIUM CHLORIDE 0.9 % IV SOLN
200.0000 mg | Freq: Once | INTRAVENOUS | Status: AC
  Administered 2015-01-27: 200 mg via INTRAVENOUS
  Filled 2015-01-27: qty 10

## 2015-01-27 NOTE — Discharge Instructions (Signed)
Iron Sucrose injection  What is this medicine?  IRON SUCROSE (AHY ern SOO krohs) is an iron complex. Iron is used to make healthy red blood cells, which carry oxygen and nutrients throughout the body. This medicine is used to treat iron deficiency anemia in people with chronic kidney disease.  This medicine may be used for other purposes; ask your health care provider or pharmacist if you have questions.  COMMON BRAND NAME(S): Venofer  What should I tell my health care provider before I take this medicine?  They need to know if you have any of these conditions:  -anemia not caused by low iron levels  -heart disease  -high levels of iron in the blood  -kidney disease  -liver disease  -an unusual or allergic reaction to iron, other medicines, foods, dyes, or preservatives  -pregnant or trying to get pregnant  -breast-feeding  How should I use this medicine?  This medicine is for infusion into a vein. It is given by a health care professional in a hospital or clinic setting.  Talk to your pediatrician regarding the use of this medicine in children. While this drug may be prescribed for children as young as 2 years for selected conditions, precautions do apply.  Overdosage: If you think you have taken too much of this medicine contact a poison control center or emergency room at once.  NOTE: This medicine is only for you. Do not share this medicine with others.  What if I miss a dose?  It is important not to miss your dose. Call your doctor or health care professional if you are unable to keep an appointment.  What may interact with this medicine?  Do not take this medicine with any of the following medications:  -deferoxamine  -dimercaprol  -other iron products  This medicine may also interact with the following medications:  -chloramphenicol  -deferasirox  This list may not describe all possible interactions. Give your health care provider a list of all the medicines, herbs, non-prescription drugs, or dietary  supplements you use. Also tell them if you smoke, drink alcohol, or use illegal drugs. Some items may interact with your medicine.  What should I watch for while using this medicine?  Visit your doctor or healthcare professional regularly. Tell your doctor or healthcare professional if your symptoms do not start to get better or if they get worse. You may need blood work done while you are taking this medicine.  You may need to follow a special diet. Talk to your doctor. Foods that contain iron include: whole grains/cereals, dried fruits, beans, or peas, leafy green vegetables, and organ meats (liver, kidney).  What side effects may I notice from receiving this medicine?  Side effects that you should report to your doctor or health care professional as soon as possible:  -allergic reactions like skin rash, itching or hives, swelling of the face, lips, or tongue  -breathing problems  -changes in blood pressure  -cough  -fast, irregular heartbeat  -feeling faint or lightheaded, falls  -fever or chills  -flushing, sweating, or hot feelings  -joint or muscle aches/pains  -seizures  -swelling of the ankles or feet  -unusually weak or tired  Side effects that usually do not require medical attention (report to your doctor or health care professional if they continue or are bothersome):  -diarrhea  -feeling achy  -headache  -irritation at site where injected  -nausea, vomiting  -stomach upset  -tiredness  This list may not describe all possible   side effects. Call your doctor for medical advice about side effects. You may report side effects to FDA at 1-800-FDA-1088.  Where should I keep my medicine?  This drug is given in a hospital or clinic and will not be stored at home.  NOTE: This sheet is a summary. It may not cover all possible information. If you have questions about this medicine, talk to your doctor, pharmacist, or health care provider.   2015, [employer]. (2011-08-16 17:14:35)

## 2015-02-08 ENCOUNTER — Ambulatory Visit (HOSPITAL_BASED_OUTPATIENT_CLINIC_OR_DEPARTMENT_OTHER): Payer: Medicare Other | Admitting: Hematology and Oncology

## 2015-02-08 VITALS — BP 152/71 | HR 70 | Temp 97.6°F | Resp 18 | Ht 62.0 in | Wt 152.0 lb

## 2015-02-08 DIAGNOSIS — D510 Vitamin B12 deficiency anemia due to intrinsic factor deficiency: Secondary | ICD-10-CM | POA: Diagnosis not present

## 2015-02-08 DIAGNOSIS — C50411 Malignant neoplasm of upper-outer quadrant of right female breast: Secondary | ICD-10-CM

## 2015-02-08 DIAGNOSIS — Z171 Estrogen receptor negative status [ER-]: Secondary | ICD-10-CM | POA: Diagnosis not present

## 2015-02-08 DIAGNOSIS — D0511 Intraductal carcinoma in situ of right breast: Secondary | ICD-10-CM | POA: Diagnosis not present

## 2015-02-08 DIAGNOSIS — K2950 Unspecified chronic gastritis without bleeding: Secondary | ICD-10-CM

## 2015-02-08 DIAGNOSIS — E1121 Type 2 diabetes mellitus with diabetic nephropathy: Secondary | ICD-10-CM | POA: Diagnosis not present

## 2015-02-08 DIAGNOSIS — N183 Chronic kidney disease, stage 3 (moderate): Secondary | ICD-10-CM | POA: Diagnosis not present

## 2015-02-08 DIAGNOSIS — M810 Age-related osteoporosis without current pathological fracture: Secondary | ICD-10-CM | POA: Diagnosis not present

## 2015-02-08 DIAGNOSIS — D0510 Intraductal carcinoma in situ of unspecified breast: Secondary | ICD-10-CM | POA: Diagnosis not present

## 2015-02-08 DIAGNOSIS — E042 Nontoxic multinodular goiter: Secondary | ICD-10-CM | POA: Diagnosis not present

## 2015-02-08 DIAGNOSIS — E780 Pure hypercholesterolemia: Secondary | ICD-10-CM | POA: Diagnosis not present

## 2015-02-08 DIAGNOSIS — Z1389 Encounter for screening for other disorder: Secondary | ICD-10-CM | POA: Diagnosis not present

## 2015-02-08 DIAGNOSIS — I129 Hypertensive chronic kidney disease with stage 1 through stage 4 chronic kidney disease, or unspecified chronic kidney disease: Secondary | ICD-10-CM | POA: Diagnosis not present

## 2015-02-08 DIAGNOSIS — Z0000 Encounter for general adult medical examination without abnormal findings: Secondary | ICD-10-CM | POA: Diagnosis not present

## 2015-02-08 DIAGNOSIS — D509 Iron deficiency anemia, unspecified: Secondary | ICD-10-CM | POA: Diagnosis not present

## 2015-02-08 NOTE — Progress Notes (Signed)
Patient Care Team: Kelton Pillar, MD as PCP - General (Family Medicine)  DIAGNOSIS: Breast cancer of upper-outer quadrant of right female breast   Staging form: Breast, AJCC 7th Edition     Clinical: Stage 0 (Tis (DCIS), N0, cM0) - Unsigned       Staging comments: Staged at breast conference 04/28/14      Pathologic: No stage assigned - Unsigned   SUMMARY OF ONCOLOGIC HISTORY:   Breast cancer of upper-outer quadrant of right female breast   04/27/2014 Breast MRI 5X4X3 cm calcifications   05/03/2014 Initial Diagnosis DCIS with calcifications ER 0%, PR 0%   05/13/2014 Surgery Right breast mastectomy: High grade DCIS, 4.5 cm, Margins neg, ER0%, PR 0%    CHIEF COMPLIANT: Follow-up of DCIS  INTERVAL HISTORY: Alisha [name] is a 81 year old lady with above-mentioned history of DCIS of the right breast treated with mastectomy and is currently on observation. She is ER/PR negative and hence she did not need antiestrogen therapy. She is doing well without any problems or concerns. She does have difficulty with ambulating and uses a cane.  REVIEW OF SYSTEMS:   Constitutional: Denies fevers, chills or abnormal weight loss Eyes: Denies blurriness of vision Ears, nose, mouth, throat, and face: Denies mucositis or sore throat Respiratory: Denies cough, dyspnea or wheezes Cardiovascular: Denies palpitation, chest discomfort or lower extremity swelling Gastrointestinal:  Denies nausea, heartburn or change in bowel habits Skin: Denies abnormal skin rashes Lymphatics: Denies new lymphadenopathy or easy bruising Neurological:Denies numbness, tingling or new weaknesses Behavioral/Psych: Mood is stable, no new changes  Breast:  denies any pain or lumps or nodules in either breasts All other systems were reviewed with the patient and are negative.  I have reviewed the past medical history, past surgical history, social history and family history with the patient and they are unchanged from previous  note.  ALLERGIES:  is allergic to ace inhibitors; evista; and zocor.  MEDICATIONS:  Current Outpatient Prescriptions  Medication Sig Dispense Refill  . alendronate (FOSAMAX) 70 MG tablet Take 70 mg by mouth once a week. Wednesday    . Calcium Carb-Cholecalciferol (CALCIUM 600 + D) 600-200 MG-UNIT TABS Take 1 tablet by mouth every morning.     . cyanocobalamin (,VITAMIN B-12,) 1000 MCG/ML injection Inject 1,000 mcg into the muscle every 3 (three) months.    Marland Kitchen GLIPIZIDE XL 5 MG 24 hr tablet Take 5 mg by mouth daily before breakfast.     . iron polysaccharides (NU-IRON) 150 MG capsule Take 150 mg by mouth every morning.    Marland Kitchen levothyroxine (SYNTHROID, LEVOTHROID) 100 MCG tablet Take 100 mcg by mouth daily before breakfast.    . metFORMIN (GLUCOPHAGE) 850 MG tablet Take 850 mg by mouth 3 (three) times daily.     . metoprolol (TOPROL-XL) 100 MG 24 hr tablet Take 100 mg by mouth daily before breakfast.     . Multiple Vitamin (MULTIVITAMIN WITH MINERALS) TABS tablet Take 1 tablet by mouth every morning.    Marland Kitchen UNABLE TO FIND 174.9 Right Mastectomy   L8000- Post Surgical Bras-6  N0539- Silicone Breast Prosthesis-1 1 each 0  . valsartan-hydrochlorothiazide (DIOVAN-HCT) 160-25 MG per tablet Take 1 tablet by mouth daily before breakfast.     . ZETIA 10 MG tablet Take 10 mg by mouth daily before breakfast.      No current facility-administered medications for this visit.    PHYSICAL EXAMINATION: ECOG PERFORMANCE STATUS: 1 - Symptomatic but completely ambulatory  Filed Vitals:   02/08/15 7673  BP: 152/71  Pulse: 70  Temp: 97.6 F (36.4 C)  Resp: 18   Filed Weights   02/08/15 0856  Weight: 152 lb (68.947 kg)    GENERAL:alert, no distress and comfortable SKIN: skin color, texture, turgor are normal, no rashes or significant lesions EYES: normal, Conjunctiva are pink and non-injected, sclera clear OROPHARYNX:no exudate, no erythema and lips, buccal mucosa, and tongue normal  NECK: supple,  thyroid normal size, non-tender, without nodularity LYMPH:  no palpable lymphadenopathy in the cervical, axillary or inguinal LUNGS: clear to auscultation and percussion with normal breathing effort HEART: regular rate & rhythm and no murmurs and no lower extremity edema ABDOMEN:abdomen soft, non-tender and normal bowel sounds Musculoskeletal:no cyanosis of digits and no clubbing  NEURO: alert & oriented x 3 with fluent speech, no focal motor/sensory deficits BREAST: No palpable masses or nodules in left breasts. Right chest wall and axilla are without any nodularity. No palpable axillary supraclavicular or infraclavicular adenopathy no breast tenderness or nipple discharge. (exam performed in the presence of a chaperone)  LABORATORY DATA:  I have reviewed the data as listed   Chemistry      Component Value Date/Time   NA 133* 05/14/2014 0446   K 4.4 05/14/2014 0446   CL 94* 05/14/2014 0446   CO2 27 05/14/2014 0446   BUN 16 05/14/2014 0446   CREATININE 1.29* 05/14/2014 0446      Component Value Date/Time   CALCIUM 8.7 05/14/2014 0446   ALKPHOS 45 07/08/2011 1742   AST 17 07/08/2011 1742   ALT 16 07/08/2011 1742   BILITOT 0.3 07/08/2011 1742       Lab Results  Component Value Date   WBC 6.2 12/06/2014   HGB 8.6* 12/06/2014   HCT 28.5* 12/06/2014   MCV 72.2* 12/06/2014   PLT 335 12/06/2014   NEUTROABS 6.6 07/08/2011   ASSESSMENT & PLAN:  Breast cancer of upper-outer quadrant of right female breast Right breast DCIS high-grade 0.5 cm ER/PR negative status post right breast mastectomy ER/PR negative.  Breast cancer surveillance: 1. Breast exam 02/08/2015 is normal 2. Continue with annual surveillance with mammograms for the left breast  Survivorship: Patient stays very active with her multiple social groups. I encouraged her to remain active and exercises regularly. She had a recent upper endoscopy and colonoscopy which showed some tubular adenomas and chronic  gastritis.  History of microcytic anemia: She received IV iron therapy and feels pretty good today. She will follow with her primary care physician and has an appointment to recheck her CBC and iron studies. She had upper endoscopy and colonoscopy which showed evidence of gastritis.  Patient follow-up with Dr. Barry Dienes for her breast cancer surveillance and I will see her on an as-needed basis. No orders of the defined types were placed in this encounter.   The patient has a good understanding of the overall plan. she agrees with it. She will call with any problems that may develop before her next visit here.   Rulon Eisenmenger, MD

## 2015-02-08 NOTE — Assessment & Plan Note (Signed)
Right breast DCIS high-grade 0.5 cm ER/PR negative status post right breast mastectomy ER/PR negative.  Breast cancer surveillance: 1. Breast exam 02/08/2015 is normal 2. Continue with annual surveillance with mammograms for the left breast  Survivorship: Patient stays very active with her multiple social groups. I encouraged her to remain active and exercises regularly. She had a recent upper endoscopy and colonoscopy which showed some tubular adenomas and chronic gastritis.  Return to clinic in 6 months for follow-up.

## 2015-02-24 DIAGNOSIS — D509 Iron deficiency anemia, unspecified: Secondary | ICD-10-CM | POA: Diagnosis not present

## 2015-02-24 DIAGNOSIS — D510 Vitamin B12 deficiency anemia due to intrinsic factor deficiency: Secondary | ICD-10-CM | POA: Diagnosis not present

## 2015-02-24 DIAGNOSIS — R195 Other fecal abnormalities: Secondary | ICD-10-CM | POA: Diagnosis not present

## 2015-02-28 IMAGING — CR DG CHEST 2V
2 series · 2 of 2 positions shown · non-contrast
Comparison: 11/28/2011

CLINICAL DATA: Preoperative evaluation for mastectomy, history
hypertension, diabetes

EXAM:
CHEST  2 VIEW

[w chest pa]
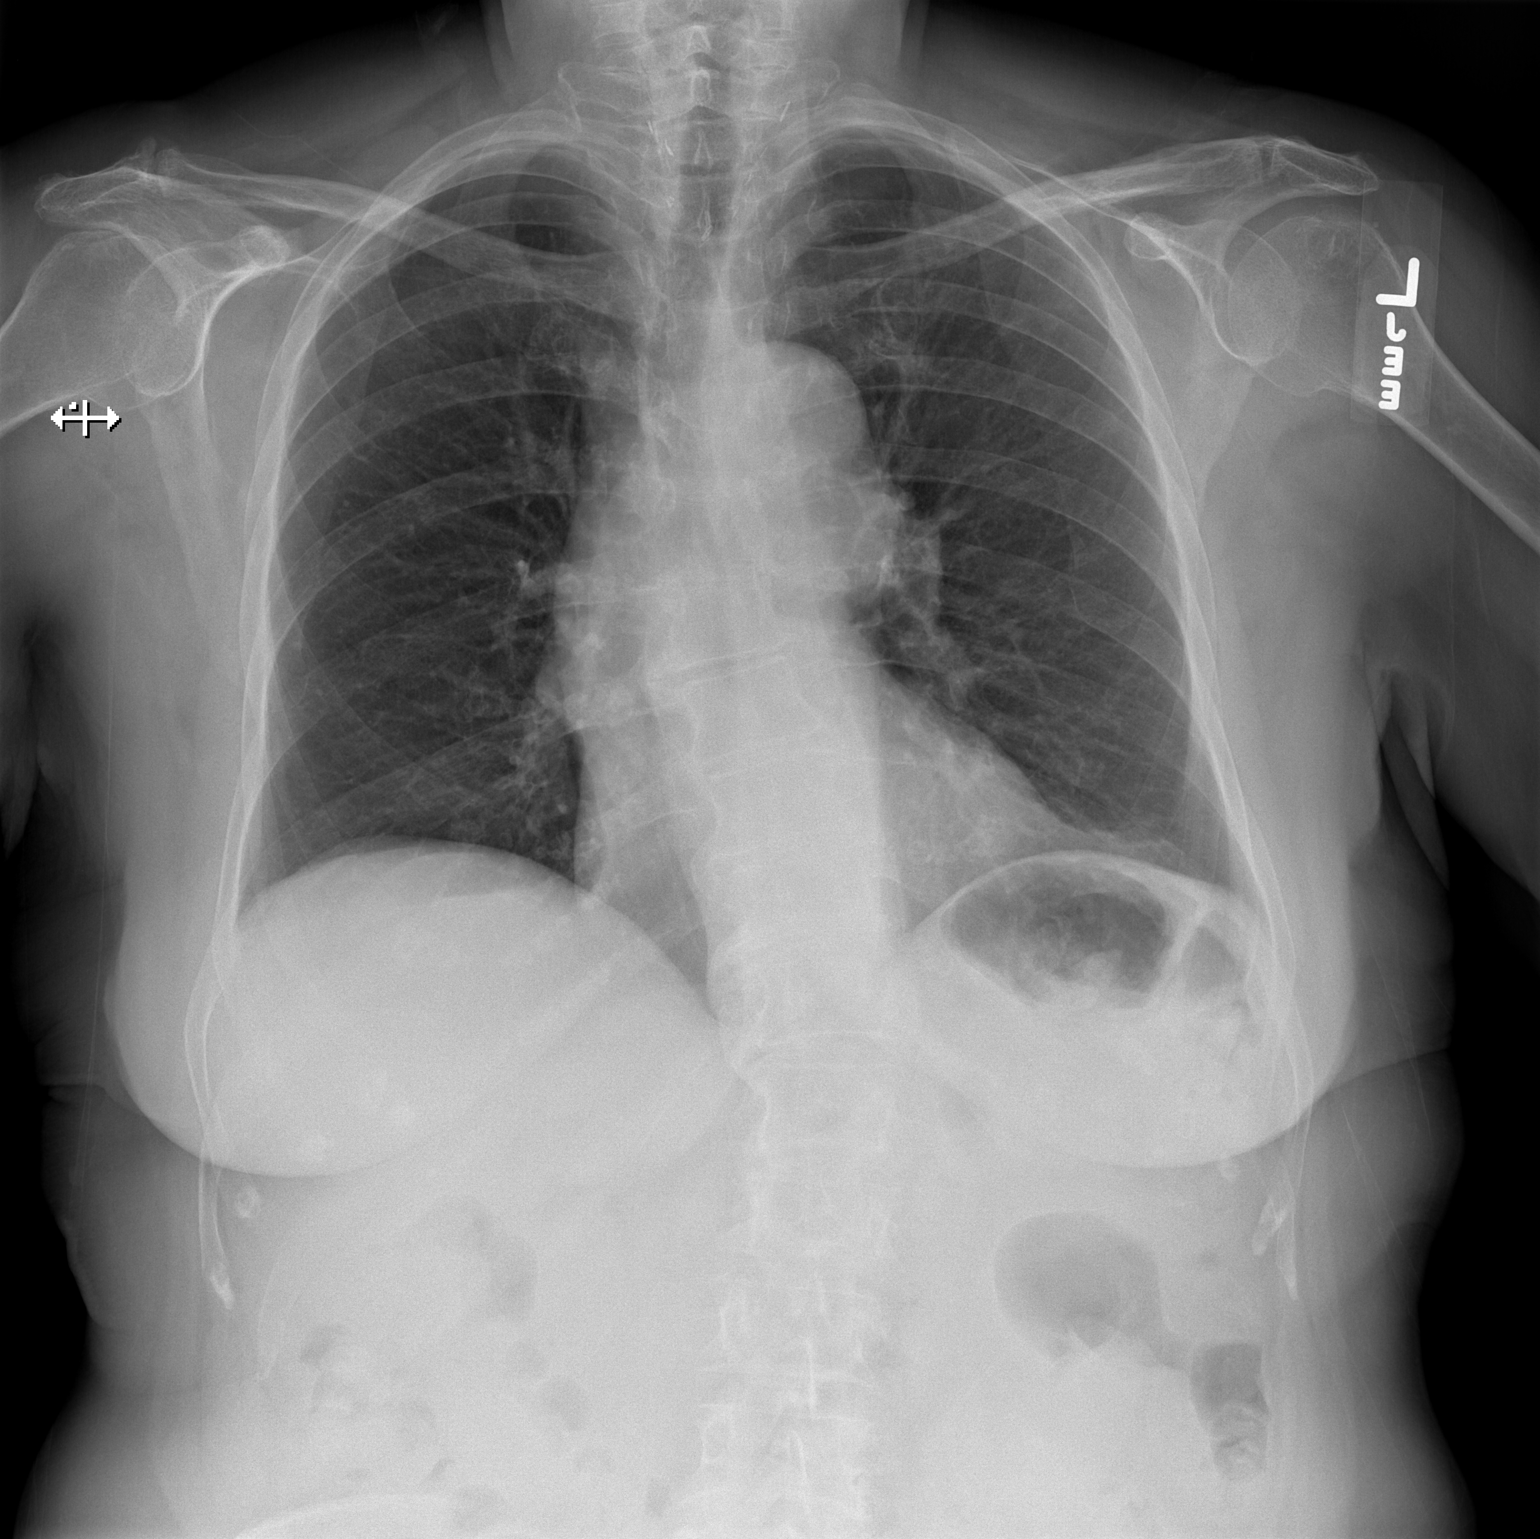

[w chest lat]
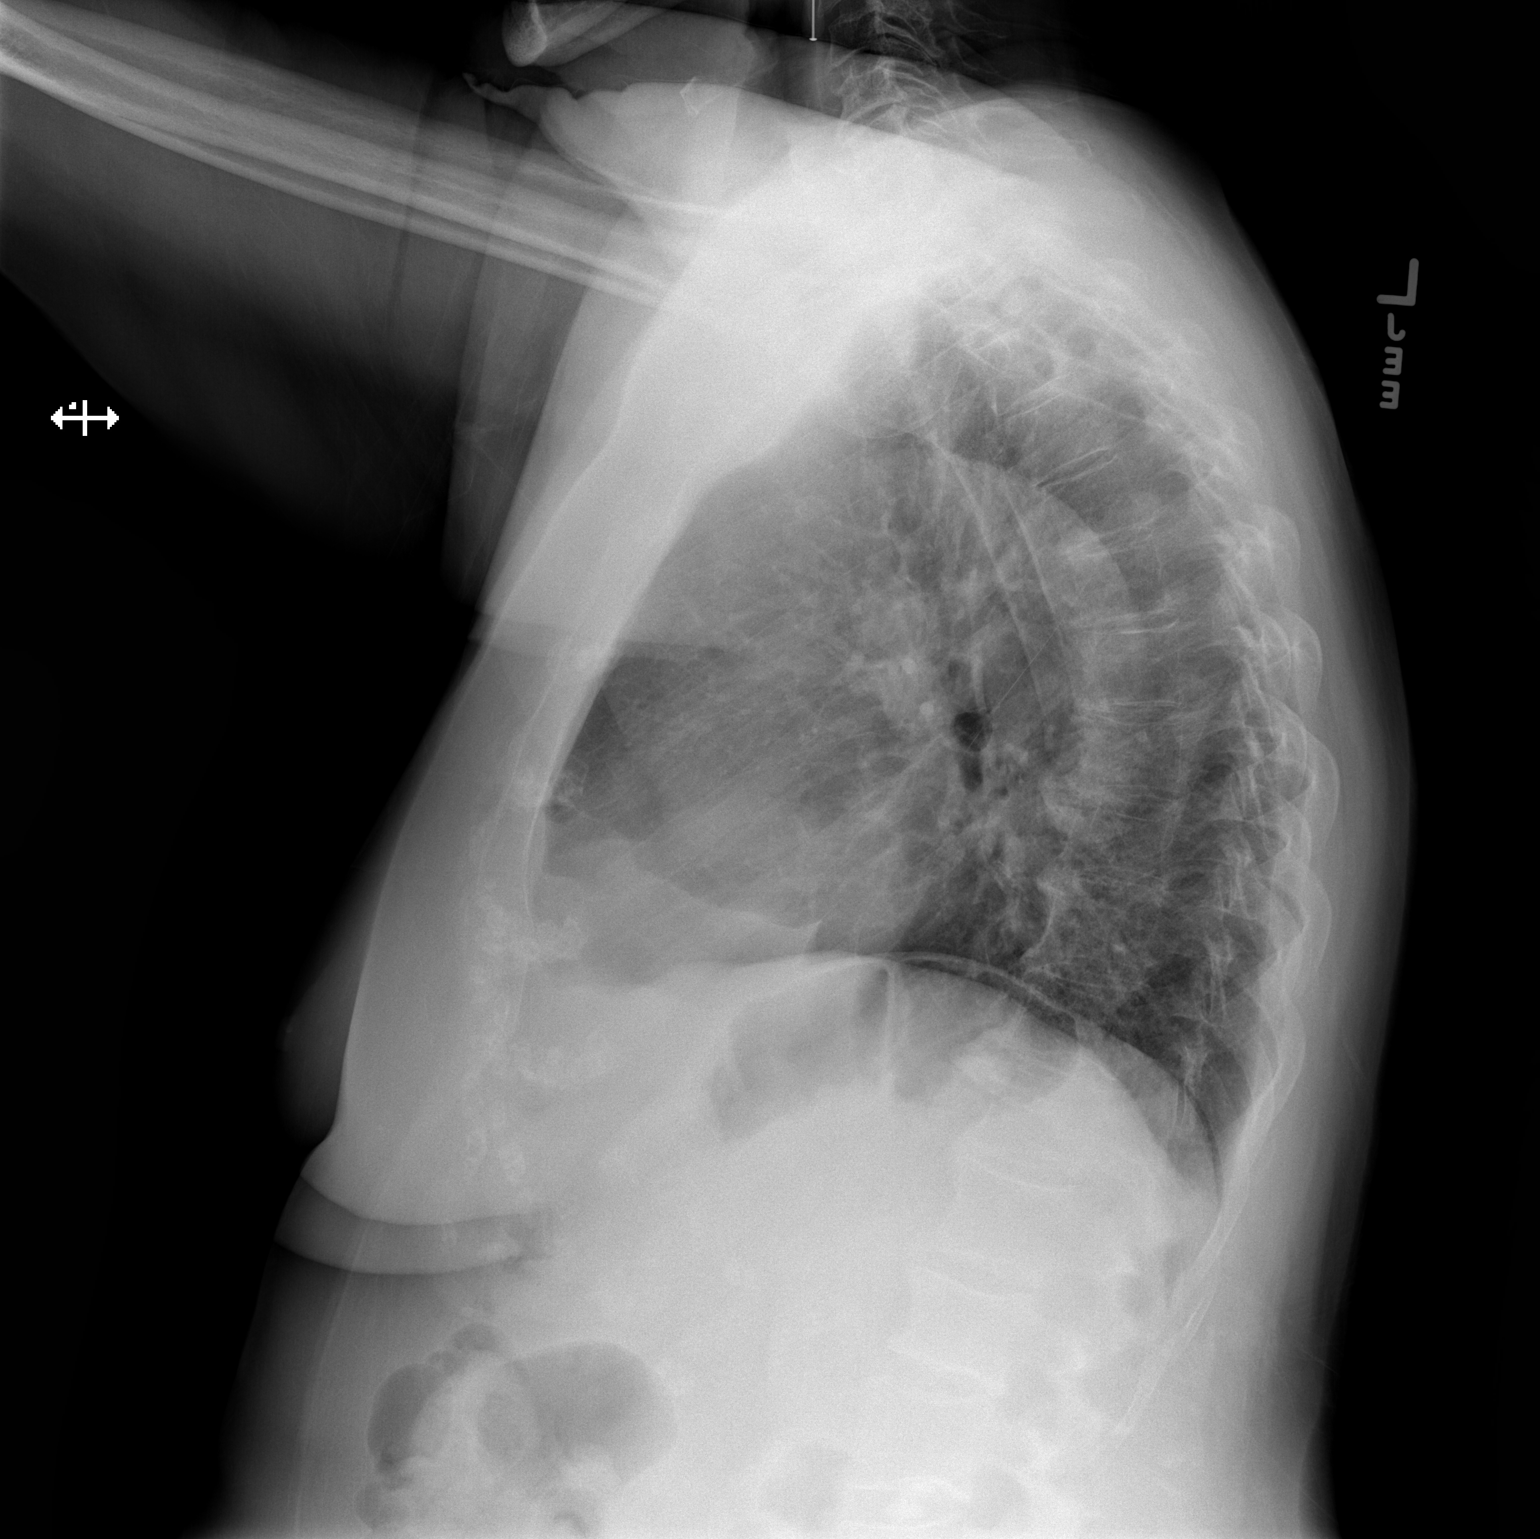

[2 of 2 positions shown; findings below may reference images not displayed]

FINDINGS: Normal heart size, mediastinal contours and pulmonary vascularity.

Lungs clear.

No pleural effusion or pneumothorax.

Thoracolumbar scoliosis with scattered degenerative disc disease
changes.

Osseous demineralization.
IMPRESSION: No acute abnormalities.

## 2015-03-02 ENCOUNTER — Encounter (HOSPITAL_COMMUNITY): Payer: Medicare Other

## 2015-03-02 ENCOUNTER — Other Ambulatory Visit (HOSPITAL_COMMUNITY): Payer: Self-pay | Admitting: *Deleted

## 2015-03-03 ENCOUNTER — Ambulatory Visit (HOSPITAL_COMMUNITY)
Admission: RE | Admit: 2015-03-03 | Discharge: 2015-03-03 | Disposition: A | Discharge status: Home / Self Care | Payer: Medicare Other | Source: Ambulatory Visit | Attending: Gastroenterology | Admitting: Gastroenterology

## 2015-03-03 DIAGNOSIS — D509 Iron deficiency anemia, unspecified: Secondary | ICD-10-CM | POA: Insufficient documentation

## 2015-03-03 MED ORDER — SODIUM CHLORIDE 0.9 % IV SOLN
200.0000 mg | Freq: Once | INTRAVENOUS | Status: AC
  Administered 2015-03-03: 200 mg via INTRAVENOUS
  Filled 2015-03-03: qty 10

## 2015-03-10 DIAGNOSIS — M8589 Other specified disorders of bone density and structure, multiple sites: Secondary | ICD-10-CM | POA: Diagnosis not present

## 2015-03-10 DIAGNOSIS — M899 Disorder of bone, unspecified: Secondary | ICD-10-CM | POA: Diagnosis not present

## 2015-03-31 DIAGNOSIS — D509 Iron deficiency anemia, unspecified: Secondary | ICD-10-CM | POA: Diagnosis not present

## 2015-04-12 DIAGNOSIS — D510 Vitamin B12 deficiency anemia due to intrinsic factor deficiency: Secondary | ICD-10-CM | POA: Diagnosis not present

## 2015-04-25 DIAGNOSIS — Z853 Personal history of malignant neoplasm of breast: Secondary | ICD-10-CM | POA: Diagnosis not present

## 2015-05-04 DIAGNOSIS — D649 Anemia, unspecified: Secondary | ICD-10-CM | POA: Diagnosis not present

## 2015-05-04 DIAGNOSIS — D509 Iron deficiency anemia, unspecified: Secondary | ICD-10-CM | POA: Diagnosis not present

## 2015-05-09 ENCOUNTER — Other Ambulatory Visit (HOSPITAL_COMMUNITY): Payer: Self-pay | Admitting: *Deleted

## 2015-05-10 ENCOUNTER — Encounter (HOSPITAL_COMMUNITY)
Admission: RE | Admit: 2015-05-10 | Discharge: 2015-05-10 | Disposition: A | Discharge status: Home / Self Care | Payer: Medicare Other | Source: Ambulatory Visit | Attending: Gastroenterology | Admitting: Gastroenterology

## 2015-05-10 DIAGNOSIS — D509 Iron deficiency anemia, unspecified: Secondary | ICD-10-CM | POA: Diagnosis not present

## 2015-05-10 MED ORDER — SODIUM CHLORIDE 0.9 % IV SOLN
25.0000 mg | Freq: Once | INTRAVENOUS | Status: DC
  Filled 2015-05-10: qty 2

## 2015-05-10 MED ORDER — SODIUM CHLORIDE 0.9 % IV SOLN: INTRAVENOUS | Status: DC

## 2015-05-10 MED ORDER — SODIUM CHLORIDE 0.9 % IV SOLN
200.0000 mg | Freq: Once | INTRAVENOUS | Status: DC
  Filled 2015-05-10: qty 16

## 2015-05-10 MED ORDER — SODIUM CHLORIDE 0.9 % IV SOLN
200.0000 mg | Freq: Once | INTRAVENOUS | Status: AC
  Administered 2015-05-10: 200 mg via INTRAVENOUS
  Filled 2015-05-10: qty 10

## 2015-05-16 DIAGNOSIS — C50411 Malignant neoplasm of upper-outer quadrant of right female breast: Secondary | ICD-10-CM | POA: Diagnosis not present

## 2015-06-13 DIAGNOSIS — D509 Iron deficiency anemia, unspecified: Secondary | ICD-10-CM | POA: Diagnosis not present

## 2015-07-13 DIAGNOSIS — D510 Vitamin B12 deficiency anemia due to intrinsic factor deficiency: Secondary | ICD-10-CM | POA: Diagnosis not present

## 2015-07-13 DIAGNOSIS — D509 Iron deficiency anemia, unspecified: Secondary | ICD-10-CM | POA: Diagnosis not present

## 2015-08-04 DIAGNOSIS — H2513 Age-related nuclear cataract, bilateral: Secondary | ICD-10-CM | POA: Diagnosis not present

## 2015-08-04 DIAGNOSIS — H25013 Cortical age-related cataract, bilateral: Secondary | ICD-10-CM | POA: Diagnosis not present

## 2015-08-04 DIAGNOSIS — E119 Type 2 diabetes mellitus without complications: Secondary | ICD-10-CM | POA: Diagnosis not present

## 2015-08-09 DIAGNOSIS — D649 Anemia, unspecified: Secondary | ICD-10-CM | POA: Diagnosis not present

## 2015-08-09 DIAGNOSIS — N183 Chronic kidney disease, stage 3 (moderate): Secondary | ICD-10-CM | POA: Diagnosis not present

## 2015-08-09 DIAGNOSIS — E1121 Type 2 diabetes mellitus with diabetic nephropathy: Secondary | ICD-10-CM | POA: Diagnosis not present

## 2015-08-09 DIAGNOSIS — Z23 Encounter for immunization: Secondary | ICD-10-CM | POA: Diagnosis not present

## 2015-08-09 DIAGNOSIS — E039 Hypothyroidism, unspecified: Secondary | ICD-10-CM | POA: Diagnosis not present

## 2015-08-09 DIAGNOSIS — I129 Hypertensive chronic kidney disease with stage 1 through stage 4 chronic kidney disease, or unspecified chronic kidney disease: Secondary | ICD-10-CM | POA: Diagnosis not present

## 2015-08-09 DIAGNOSIS — E780 Pure hypercholesterolemia: Secondary | ICD-10-CM | POA: Diagnosis not present

## 2015-08-09 DIAGNOSIS — D510 Vitamin B12 deficiency anemia due to intrinsic factor deficiency: Secondary | ICD-10-CM | POA: Diagnosis not present

## 2015-08-10 DIAGNOSIS — H25011 Cortical age-related cataract, right eye: Secondary | ICD-10-CM | POA: Diagnosis not present

## 2015-08-10 DIAGNOSIS — H2511 Age-related nuclear cataract, right eye: Secondary | ICD-10-CM | POA: Diagnosis not present

## 2015-08-24 DIAGNOSIS — H25011 Cortical age-related cataract, right eye: Secondary | ICD-10-CM | POA: Diagnosis not present

## 2015-08-24 DIAGNOSIS — H2511 Age-related nuclear cataract, right eye: Secondary | ICD-10-CM | POA: Diagnosis not present

## 2015-08-29 DIAGNOSIS — H25012 Cortical age-related cataract, left eye: Secondary | ICD-10-CM | POA: Diagnosis not present

## 2015-08-29 DIAGNOSIS — H2512 Age-related nuclear cataract, left eye: Secondary | ICD-10-CM | POA: Diagnosis not present

## 2015-09-07 DIAGNOSIS — H25012 Cortical age-related cataract, left eye: Secondary | ICD-10-CM | POA: Diagnosis not present

## 2015-09-07 DIAGNOSIS — H2512 Age-related nuclear cataract, left eye: Secondary | ICD-10-CM | POA: Diagnosis not present

## 2015-10-10 DIAGNOSIS — E1121 Type 2 diabetes mellitus with diabetic nephropathy: Secondary | ICD-10-CM | POA: Diagnosis not present

## 2015-10-10 DIAGNOSIS — Z7984 Long term (current) use of oral hypoglycemic drugs: Secondary | ICD-10-CM | POA: Diagnosis not present

## 2015-10-10 DIAGNOSIS — D510 Vitamin B12 deficiency anemia due to intrinsic factor deficiency: Secondary | ICD-10-CM | POA: Diagnosis not present

## 2015-10-21 ENCOUNTER — Other Ambulatory Visit (HOSPITAL_COMMUNITY): Payer: Self-pay | Admitting: *Deleted

## 2015-10-24 ENCOUNTER — Ambulatory Visit (HOSPITAL_COMMUNITY)
Admission: RE | Admit: 2015-10-24 | Discharge: 2015-10-24 | Disposition: A | Discharge status: Home / Self Care | Payer: Medicare Other | Source: Ambulatory Visit | Attending: Gastroenterology | Admitting: Gastroenterology

## 2015-10-24 DIAGNOSIS — D509 Iron deficiency anemia, unspecified: Secondary | ICD-10-CM | POA: Diagnosis not present

## 2015-10-24 MED ORDER — SODIUM CHLORIDE 0.9 % IV SOLN
200.0000 mg | Freq: Once | INTRAVENOUS | Status: AC
  Administered 2015-10-24: 200 mg via INTRAVENOUS
  Filled 2015-10-24: qty 10

## 2015-10-31 DIAGNOSIS — C50411 Malignant neoplasm of upper-outer quadrant of right female breast: Secondary | ICD-10-CM | POA: Diagnosis not present

## 2015-11-15 DIAGNOSIS — D509 Iron deficiency anemia, unspecified: Secondary | ICD-10-CM | POA: Diagnosis not present

## 2015-11-24 DIAGNOSIS — D649 Anemia, unspecified: Secondary | ICD-10-CM | POA: Diagnosis not present

## 2015-12-20 ENCOUNTER — Other Ambulatory Visit (HOSPITAL_COMMUNITY): Payer: Self-pay | Admitting: *Deleted

## 2015-12-21 ENCOUNTER — Encounter (HOSPITAL_COMMUNITY)
Admission: RE | Admit: 2015-12-21 | Discharge: 2015-12-21 | Disposition: A | Discharge status: Home / Self Care | Payer: Medicare Other | Source: Ambulatory Visit | Attending: Gastroenterology | Admitting: Gastroenterology

## 2015-12-21 DIAGNOSIS — D509 Iron deficiency anemia, unspecified: Secondary | ICD-10-CM | POA: Insufficient documentation

## 2015-12-21 MED ORDER — IRON SUCROSE 20 MG/ML IV SOLN
200.0000 mg | Freq: Once | INTRAVENOUS | Status: AC
  Administered 2015-12-21: 200 mg via INTRAVENOUS
  Filled 2015-12-21: qty 10

## 2016-01-02 DIAGNOSIS — Z961 Presence of intraocular lens: Secondary | ICD-10-CM | POA: Diagnosis not present

## 2016-01-10 DIAGNOSIS — D510 Vitamin B12 deficiency anemia due to intrinsic factor deficiency: Secondary | ICD-10-CM | POA: Diagnosis not present

## 2016-01-10 DIAGNOSIS — D509 Iron deficiency anemia, unspecified: Secondary | ICD-10-CM | POA: Diagnosis not present

## 2016-01-12 DIAGNOSIS — D649 Anemia, unspecified: Secondary | ICD-10-CM | POA: Diagnosis not present

## 2016-02-14 DIAGNOSIS — E7800 Pure hypercholesterolemia, unspecified: Secondary | ICD-10-CM | POA: Diagnosis not present

## 2016-02-14 DIAGNOSIS — M810 Age-related osteoporosis without current pathological fracture: Secondary | ICD-10-CM | POA: Diagnosis not present

## 2016-02-14 DIAGNOSIS — D509 Iron deficiency anemia, unspecified: Secondary | ICD-10-CM | POA: Diagnosis not present

## 2016-02-14 DIAGNOSIS — E1121 Type 2 diabetes mellitus with diabetic nephropathy: Secondary | ICD-10-CM | POA: Diagnosis not present

## 2016-02-14 DIAGNOSIS — E039 Hypothyroidism, unspecified: Secondary | ICD-10-CM | POA: Diagnosis not present

## 2016-02-14 DIAGNOSIS — N183 Chronic kidney disease, stage 3 (moderate): Secondary | ICD-10-CM | POA: Diagnosis not present

## 2016-02-14 DIAGNOSIS — D510 Vitamin B12 deficiency anemia due to intrinsic factor deficiency: Secondary | ICD-10-CM | POA: Diagnosis not present

## 2016-02-14 DIAGNOSIS — D0510 Intraductal carcinoma in situ of unspecified breast: Secondary | ICD-10-CM | POA: Diagnosis not present

## 2016-02-14 DIAGNOSIS — Z23 Encounter for immunization: Secondary | ICD-10-CM | POA: Diagnosis not present

## 2016-02-14 DIAGNOSIS — Z0000 Encounter for general adult medical examination without abnormal findings: Secondary | ICD-10-CM | POA: Diagnosis not present

## 2016-02-14 DIAGNOSIS — Z1389 Encounter for screening for other disorder: Secondary | ICD-10-CM | POA: Diagnosis not present

## 2016-02-14 DIAGNOSIS — I129 Hypertensive chronic kidney disease with stage 1 through stage 4 chronic kidney disease, or unspecified chronic kidney disease: Secondary | ICD-10-CM | POA: Diagnosis not present

## 2016-03-07 DIAGNOSIS — D649 Anemia, unspecified: Secondary | ICD-10-CM | POA: Diagnosis not present

## 2016-03-15 DIAGNOSIS — D510 Vitamin B12 deficiency anemia due to intrinsic factor deficiency: Secondary | ICD-10-CM | POA: Diagnosis not present

## 2016-03-27 ENCOUNTER — Encounter (HOSPITAL_COMMUNITY)
Admission: RE | Admit: 2016-03-27 | Discharge: 2016-03-27 | Disposition: A | Discharge status: Home / Self Care | Payer: Medicare Other | Source: Ambulatory Visit | Attending: Gastroenterology | Admitting: Gastroenterology

## 2016-03-27 DIAGNOSIS — D649 Anemia, unspecified: Secondary | ICD-10-CM | POA: Insufficient documentation

## 2016-03-27 MED ORDER — SODIUM CHLORIDE 0.9 % IV SOLN
200.0000 mg | Freq: Once | INTRAVENOUS | Status: AC
  Administered 2016-03-27: 200 mg via INTRAVENOUS
  Filled 2016-03-27: qty 10

## 2016-04-11 DIAGNOSIS — D509 Iron deficiency anemia, unspecified: Secondary | ICD-10-CM | POA: Diagnosis not present

## 2016-04-11 DIAGNOSIS — D510 Vitamin B12 deficiency anemia due to intrinsic factor deficiency: Secondary | ICD-10-CM | POA: Diagnosis not present

## 2016-04-25 ENCOUNTER — Encounter (HOSPITAL_COMMUNITY)
Admission: RE | Admit: 2016-04-25 | Discharge: 2016-04-25 | Disposition: A | Discharge status: Home / Self Care | Payer: Medicare Other | Source: Ambulatory Visit | Attending: Gastroenterology | Admitting: Gastroenterology

## 2016-04-25 DIAGNOSIS — D649 Anemia, unspecified: Secondary | ICD-10-CM | POA: Insufficient documentation

## 2016-04-25 MED ORDER — SODIUM CHLORIDE 0.9 % IV SOLN
200.0000 mg | Freq: Once | INTRAVENOUS | Status: AC
  Administered 2016-04-25: 200 mg via INTRAVENOUS
  Filled 2016-04-25: qty 10

## 2016-05-03 DIAGNOSIS — Z7984 Long term (current) use of oral hypoglycemic drugs: Secondary | ICD-10-CM | POA: Diagnosis not present

## 2016-05-03 DIAGNOSIS — E1121 Type 2 diabetes mellitus with diabetic nephropathy: Secondary | ICD-10-CM | POA: Diagnosis not present

## 2016-05-03 DIAGNOSIS — E039 Hypothyroidism, unspecified: Secondary | ICD-10-CM | POA: Diagnosis not present

## 2016-05-03 DIAGNOSIS — D509 Iron deficiency anemia, unspecified: Secondary | ICD-10-CM | POA: Diagnosis not present

## 2016-05-14 DIAGNOSIS — C50411 Malignant neoplasm of upper-outer quadrant of right female breast: Secondary | ICD-10-CM | POA: Diagnosis not present

## 2016-05-15 DIAGNOSIS — D510 Vitamin B12 deficiency anemia due to intrinsic factor deficiency: Secondary | ICD-10-CM | POA: Diagnosis not present

## 2016-05-16 DIAGNOSIS — D509 Iron deficiency anemia, unspecified: Secondary | ICD-10-CM | POA: Diagnosis not present

## 2016-05-24 ENCOUNTER — Encounter (HOSPITAL_COMMUNITY)
Admission: RE | Admit: 2016-05-24 | Discharge: 2016-05-24 | Disposition: A | Discharge status: Home / Self Care | Payer: Medicare Other | Source: Ambulatory Visit | Attending: Gastroenterology | Admitting: Gastroenterology

## 2016-05-24 DIAGNOSIS — D649 Anemia, unspecified: Secondary | ICD-10-CM | POA: Insufficient documentation

## 2016-05-24 MED ORDER — SODIUM CHLORIDE 0.9 % IV SOLN
200.0000 mg | Freq: Once | INTRAVENOUS | Status: AC
  Administered 2016-05-24: 200 mg via INTRAVENOUS
  Filled 2016-05-24: qty 10

## 2016-05-31 DIAGNOSIS — N6002 Solitary cyst of left breast: Secondary | ICD-10-CM | POA: Diagnosis not present

## 2016-05-31 DIAGNOSIS — N63 Unspecified lump in breast: Secondary | ICD-10-CM | POA: Diagnosis not present

## 2016-05-31 DIAGNOSIS — Z853 Personal history of malignant neoplasm of breast: Secondary | ICD-10-CM | POA: Diagnosis not present

## 2016-06-21 DIAGNOSIS — D649 Anemia, unspecified: Secondary | ICD-10-CM | POA: Diagnosis not present

## 2016-06-26 NOTE — Addendum Note (Signed)
Encounter addended by: Santiago Glad, RN on: 06/26/2016 10:51 AM<BR>    Actions taken: Charge Capture section accepted

## 2016-07-09 DIAGNOSIS — D510 Vitamin B12 deficiency anemia due to intrinsic factor deficiency: Secondary | ICD-10-CM | POA: Diagnosis not present

## 2016-08-07 DIAGNOSIS — E039 Hypothyroidism, unspecified: Secondary | ICD-10-CM | POA: Diagnosis not present

## 2016-08-07 DIAGNOSIS — D510 Vitamin B12 deficiency anemia due to intrinsic factor deficiency: Secondary | ICD-10-CM | POA: Diagnosis not present

## 2016-08-07 DIAGNOSIS — Z23 Encounter for immunization: Secondary | ICD-10-CM | POA: Diagnosis not present

## 2016-08-07 DIAGNOSIS — Z7984 Long term (current) use of oral hypoglycemic drugs: Secondary | ICD-10-CM | POA: Diagnosis not present

## 2016-08-07 DIAGNOSIS — E1121 Type 2 diabetes mellitus with diabetic nephropathy: Secondary | ICD-10-CM | POA: Diagnosis not present

## 2016-08-07 DIAGNOSIS — I129 Hypertensive chronic kidney disease with stage 1 through stage 4 chronic kidney disease, or unspecified chronic kidney disease: Secondary | ICD-10-CM | POA: Diagnosis not present

## 2016-08-07 DIAGNOSIS — N183 Chronic kidney disease, stage 3 (moderate): Secondary | ICD-10-CM | POA: Diagnosis not present

## 2016-08-07 DIAGNOSIS — E7800 Pure hypercholesterolemia, unspecified: Secondary | ICD-10-CM | POA: Diagnosis not present

## 2016-09-10 DIAGNOSIS — D510 Vitamin B12 deficiency anemia due to intrinsic factor deficiency: Secondary | ICD-10-CM | POA: Diagnosis not present

## 2016-09-27 DIAGNOSIS — D509 Iron deficiency anemia, unspecified: Secondary | ICD-10-CM | POA: Diagnosis not present

## 2016-10-08 DIAGNOSIS — D510 Vitamin B12 deficiency anemia due to intrinsic factor deficiency: Secondary | ICD-10-CM | POA: Diagnosis not present

## 2016-10-15 ENCOUNTER — Other Ambulatory Visit (HOSPITAL_COMMUNITY): Payer: Self-pay | Admitting: *Deleted

## 2016-10-16 ENCOUNTER — Ambulatory Visit (HOSPITAL_COMMUNITY)
Admission: RE | Admit: 2016-10-16 | Discharge: 2016-10-16 | Disposition: A | Discharge status: Home / Self Care | Payer: Medicare Other | Source: Ambulatory Visit | Attending: Gastroenterology | Admitting: Gastroenterology

## 2016-10-16 DIAGNOSIS — D509 Iron deficiency anemia, unspecified: Secondary | ICD-10-CM | POA: Insufficient documentation

## 2016-10-16 MED ORDER — SODIUM CHLORIDE 0.9 % IV SOLN
200.0000 mg | Freq: Once | INTRAVENOUS | Status: DC
  Filled 2016-10-16: qty 16

## 2016-10-16 MED ORDER — SODIUM CHLORIDE 0.9 % IV SOLN
200.0000 mg | Freq: Once | INTRAVENOUS | Status: AC
  Administered 2016-10-16: 200 mg via INTRAVENOUS
  Filled 2016-10-16: qty 10

## 2016-11-05 DIAGNOSIS — D510 Vitamin B12 deficiency anemia due to intrinsic factor deficiency: Secondary | ICD-10-CM | POA: Diagnosis not present

## 2016-12-04 DIAGNOSIS — D509 Iron deficiency anemia, unspecified: Secondary | ICD-10-CM | POA: Diagnosis not present

## 2016-12-04 DIAGNOSIS — D510 Vitamin B12 deficiency anemia due to intrinsic factor deficiency: Secondary | ICD-10-CM | POA: Diagnosis not present

## 2017-01-07 DIAGNOSIS — D510 Vitamin B12 deficiency anemia due to intrinsic factor deficiency: Secondary | ICD-10-CM | POA: Diagnosis not present

## 2017-02-04 DIAGNOSIS — D510 Vitamin B12 deficiency anemia due to intrinsic factor deficiency: Secondary | ICD-10-CM | POA: Diagnosis not present

## 2017-03-04 DIAGNOSIS — D510 Vitamin B12 deficiency anemia due to intrinsic factor deficiency: Secondary | ICD-10-CM | POA: Diagnosis not present

## 2017-04-01 ENCOUNTER — Other Ambulatory Visit: Payer: Self-pay | Admitting: Family Medicine

## 2017-04-01 DIAGNOSIS — E039 Hypothyroidism, unspecified: Secondary | ICD-10-CM | POA: Diagnosis not present

## 2017-04-01 DIAGNOSIS — M810 Age-related osteoporosis without current pathological fracture: Secondary | ICD-10-CM | POA: Diagnosis not present

## 2017-04-01 DIAGNOSIS — Z0000 Encounter for general adult medical examination without abnormal findings: Secondary | ICD-10-CM | POA: Diagnosis not present

## 2017-04-01 DIAGNOSIS — D0510 Intraductal carcinoma in situ of unspecified breast: Secondary | ICD-10-CM | POA: Diagnosis not present

## 2017-04-01 DIAGNOSIS — E7800 Pure hypercholesterolemia, unspecified: Secondary | ICD-10-CM | POA: Diagnosis not present

## 2017-04-01 DIAGNOSIS — N183 Chronic kidney disease, stage 3 (moderate): Secondary | ICD-10-CM | POA: Diagnosis not present

## 2017-04-01 DIAGNOSIS — E1121 Type 2 diabetes mellitus with diabetic nephropathy: Secondary | ICD-10-CM | POA: Diagnosis not present

## 2017-04-01 DIAGNOSIS — D510 Vitamin B12 deficiency anemia due to intrinsic factor deficiency: Secondary | ICD-10-CM | POA: Diagnosis not present

## 2017-04-01 DIAGNOSIS — D509 Iron deficiency anemia, unspecified: Secondary | ICD-10-CM | POA: Diagnosis not present

## 2017-04-01 DIAGNOSIS — R011 Cardiac murmur, unspecified: Secondary | ICD-10-CM | POA: Diagnosis not present

## 2017-04-01 DIAGNOSIS — I129 Hypertensive chronic kidney disease with stage 1 through stage 4 chronic kidney disease, or unspecified chronic kidney disease: Secondary | ICD-10-CM | POA: Diagnosis not present

## 2017-04-12 ENCOUNTER — Other Ambulatory Visit: Payer: Self-pay

## 2017-04-12 ENCOUNTER — Ambulatory Visit (HOSPITAL_COMMUNITY): Payer: Medicare Other | Attending: Cardiology

## 2017-04-12 DIAGNOSIS — I083 Combined rheumatic disorders of mitral, aortic and tricuspid valves: Secondary | ICD-10-CM | POA: Insufficient documentation

## 2017-04-12 DIAGNOSIS — I10 Essential (primary) hypertension: Secondary | ICD-10-CM | POA: Insufficient documentation

## 2017-04-12 DIAGNOSIS — E785 Hyperlipidemia, unspecified: Secondary | ICD-10-CM | POA: Insufficient documentation

## 2017-04-12 DIAGNOSIS — R011 Cardiac murmur, unspecified: Secondary | ICD-10-CM

## 2017-04-12 DIAGNOSIS — E119 Type 2 diabetes mellitus without complications: Secondary | ICD-10-CM | POA: Insufficient documentation

## 2017-04-25 DIAGNOSIS — E119 Type 2 diabetes mellitus without complications: Secondary | ICD-10-CM | POA: Diagnosis not present

## 2017-04-25 DIAGNOSIS — Z961 Presence of intraocular lens: Secondary | ICD-10-CM | POA: Diagnosis not present

## 2017-04-29 DIAGNOSIS — D510 Vitamin B12 deficiency anemia due to intrinsic factor deficiency: Secondary | ICD-10-CM | POA: Diagnosis not present

## 2017-05-04 DIAGNOSIS — M5430 Sciatica, unspecified side: Secondary | ICD-10-CM | POA: Diagnosis not present

## 2017-05-07 DIAGNOSIS — S32000A Wedge compression fracture of unspecified lumbar vertebra, initial encounter for closed fracture: Secondary | ICD-10-CM | POA: Diagnosis not present

## 2017-05-07 DIAGNOSIS — D649 Anemia, unspecified: Secondary | ICD-10-CM | POA: Diagnosis not present

## 2017-05-07 DIAGNOSIS — M810 Age-related osteoporosis without current pathological fracture: Secondary | ICD-10-CM | POA: Diagnosis not present

## 2017-05-07 DIAGNOSIS — M5430 Sciatica, unspecified side: Secondary | ICD-10-CM | POA: Diagnosis not present

## 2017-05-27 DIAGNOSIS — D510 Vitamin B12 deficiency anemia due to intrinsic factor deficiency: Secondary | ICD-10-CM | POA: Diagnosis not present

## 2017-06-04 DIAGNOSIS — D500 Iron deficiency anemia secondary to blood loss (chronic): Secondary | ICD-10-CM | POA: Diagnosis not present

## 2017-06-06 DIAGNOSIS — N6321 Unspecified lump in the left breast, upper outer quadrant: Secondary | ICD-10-CM | POA: Diagnosis not present

## 2017-06-06 DIAGNOSIS — Z853 Personal history of malignant neoplasm of breast: Secondary | ICD-10-CM | POA: Diagnosis not present

## 2017-06-12 ENCOUNTER — Other Ambulatory Visit (HOSPITAL_COMMUNITY): Payer: Self-pay | Admitting: *Deleted

## 2017-06-13 ENCOUNTER — Encounter (HOSPITAL_COMMUNITY): Payer: Self-pay

## 2017-06-13 ENCOUNTER — Ambulatory Visit (HOSPITAL_COMMUNITY)
Admission: RE | Admit: 2017-06-13 | Discharge: 2017-06-13 | Disposition: A | Discharge status: Home / Self Care | Payer: Medicare Other | Source: Ambulatory Visit | Attending: Gastroenterology | Admitting: Gastroenterology

## 2017-06-13 DIAGNOSIS — D649 Anemia, unspecified: Secondary | ICD-10-CM | POA: Diagnosis not present

## 2017-06-13 MED ORDER — SODIUM CHLORIDE 0.9 % IV SOLN
200.0000 mg | Freq: Once | INTRAVENOUS | Status: AC
  Administered 2017-06-13: 200 mg via INTRAVENOUS
  Filled 2017-06-13: qty 10

## 2017-06-18 DIAGNOSIS — M810 Age-related osteoporosis without current pathological fracture: Secondary | ICD-10-CM | POA: Diagnosis not present

## 2017-06-18 DIAGNOSIS — M8588 Other specified disorders of bone density and structure, other site: Secondary | ICD-10-CM | POA: Diagnosis not present

## 2017-07-02 DIAGNOSIS — D510 Vitamin B12 deficiency anemia due to intrinsic factor deficiency: Secondary | ICD-10-CM | POA: Diagnosis not present

## 2017-07-02 DIAGNOSIS — D649 Anemia, unspecified: Secondary | ICD-10-CM | POA: Diagnosis not present

## 2017-08-05 DIAGNOSIS — D510 Vitamin B12 deficiency anemia due to intrinsic factor deficiency: Secondary | ICD-10-CM | POA: Diagnosis not present

## 2017-09-02 DIAGNOSIS — D510 Vitamin B12 deficiency anemia due to intrinsic factor deficiency: Secondary | ICD-10-CM | POA: Diagnosis not present

## 2017-09-02 DIAGNOSIS — D500 Iron deficiency anemia secondary to blood loss (chronic): Secondary | ICD-10-CM | POA: Diagnosis not present

## 2017-09-02 DIAGNOSIS — Z23 Encounter for immunization: Secondary | ICD-10-CM | POA: Diagnosis not present

## 2017-09-19 ENCOUNTER — Other Ambulatory Visit (HOSPITAL_COMMUNITY): Payer: Self-pay | Admitting: *Deleted

## 2017-09-20 ENCOUNTER — Ambulatory Visit (HOSPITAL_COMMUNITY)
Admission: RE | Admit: 2017-09-20 | Discharge: 2017-09-20 | Disposition: A | Discharge status: Home / Self Care | Payer: Medicare Other | Source: Ambulatory Visit | Attending: Gastroenterology | Admitting: Gastroenterology

## 2017-09-20 DIAGNOSIS — D500 Iron deficiency anemia secondary to blood loss (chronic): Secondary | ICD-10-CM | POA: Diagnosis not present

## 2017-09-20 LAB — CBC
HEMATOCRIT: 24.8 % — AB (ref 36.0–46.0)
Hemoglobin: 7.3 g/dL — ABNORMAL LOW (ref 12.0–15.0)
MCH: 19.6 pg — ABNORMAL LOW (ref 26.0–34.0)
MCHC: 29.4 g/dL — ABNORMAL LOW (ref 30.0–36.0)
MCV: 66.5 fL — AB (ref 78.0–100.0)
Platelets: 435 10*3/uL — ABNORMAL HIGH (ref 150–400)
RBC: 3.73 MIL/uL — ABNORMAL LOW (ref 3.87–5.11)
RDW: 19 % — AB (ref 11.5–15.5)
WBC: 8 10*3/uL (ref 4.0–10.5)

## 2017-09-20 LAB — FERRITIN: Ferritin: 2 ng/mL — ABNORMAL LOW (ref 11–307)

## 2017-09-20 LAB — IRON AND TIBC
IRON: 10 ug/dL — AB (ref 28–170)
Saturation Ratios: 2 % — ABNORMAL LOW (ref 10.4–31.8)
TIBC: 428 ug/dL (ref 250–450)
UIBC: 418 ug/dL

## 2017-09-20 MED ORDER — SODIUM CHLORIDE 0.9 % IV SOLN
200.0000 mg | Freq: Once | INTRAVENOUS | Status: AC
  Administered 2017-09-20: 200 mg via INTRAVENOUS
  Filled 2017-09-20: qty 10

## 2017-10-09 DIAGNOSIS — E7800 Pure hypercholesterolemia, unspecified: Secondary | ICD-10-CM | POA: Diagnosis not present

## 2017-10-09 DIAGNOSIS — D509 Iron deficiency anemia, unspecified: Secondary | ICD-10-CM | POA: Diagnosis not present

## 2017-10-09 DIAGNOSIS — D510 Vitamin B12 deficiency anemia due to intrinsic factor deficiency: Secondary | ICD-10-CM | POA: Diagnosis not present

## 2017-10-09 DIAGNOSIS — E039 Hypothyroidism, unspecified: Secondary | ICD-10-CM | POA: Diagnosis not present

## 2017-10-09 DIAGNOSIS — N183 Chronic kidney disease, stage 3 (moderate): Secondary | ICD-10-CM | POA: Diagnosis not present

## 2017-10-09 DIAGNOSIS — D649 Anemia, unspecified: Secondary | ICD-10-CM | POA: Diagnosis not present

## 2017-10-09 DIAGNOSIS — I129 Hypertensive chronic kidney disease with stage 1 through stage 4 chronic kidney disease, or unspecified chronic kidney disease: Secondary | ICD-10-CM | POA: Diagnosis not present

## 2017-10-09 DIAGNOSIS — E1121 Type 2 diabetes mellitus with diabetic nephropathy: Secondary | ICD-10-CM | POA: Diagnosis not present

## 2017-10-23 ENCOUNTER — Encounter (HOSPITAL_COMMUNITY)
Admission: RE | Admit: 2017-10-23 | Discharge: 2017-10-23 | Disposition: A | Discharge status: Home / Self Care | Payer: Medicare Other | Source: Ambulatory Visit | Attending: Gastroenterology | Admitting: Gastroenterology

## 2017-10-23 DIAGNOSIS — D500 Iron deficiency anemia secondary to blood loss (chronic): Secondary | ICD-10-CM | POA: Insufficient documentation

## 2017-10-23 LAB — CBC
HCT: 27.4 % — ABNORMAL LOW (ref 36.0–46.0)
Hemoglobin: 8 g/dL — ABNORMAL LOW (ref 12.0–15.0)
MCH: 20.3 pg — ABNORMAL LOW (ref 26.0–34.0)
MCHC: 29.2 g/dL — ABNORMAL LOW (ref 30.0–36.0)
MCV: 69.5 fL — AB (ref 78.0–100.0)
PLATELETS: 458 10*3/uL — AB (ref 150–400)
RBC: 3.94 MIL/uL (ref 3.87–5.11)
RDW: 21.1 % — AB (ref 11.5–15.5)
WBC: 8.9 10*3/uL (ref 4.0–10.5)

## 2017-10-23 LAB — IRON AND TIBC
IRON: 25 ug/dL — AB (ref 28–170)
SATURATION RATIOS: 6 % — AB (ref 10.4–31.8)
TIBC: 410 ug/dL (ref 250–450)
UIBC: 385 ug/dL

## 2017-10-23 LAB — FERRITIN: Ferritin: 8 ng/mL — ABNORMAL LOW (ref 11–307)

## 2017-10-23 MED ORDER — SODIUM CHLORIDE 0.9 % IV SOLN
200.0000 mg | INTRAVENOUS | Status: DC
  Administered 2017-10-23: 10:00:00 200 mg via INTRAVENOUS
  Filled 2017-10-23: qty 10

## 2017-11-27 ENCOUNTER — Other Ambulatory Visit (HOSPITAL_COMMUNITY): Payer: Self-pay | Admitting: *Deleted

## 2017-11-27 ENCOUNTER — Encounter (HOSPITAL_COMMUNITY): Payer: Medicare Other

## 2017-11-28 ENCOUNTER — Ambulatory Visit (HOSPITAL_COMMUNITY)
Admission: RE | Admit: 2017-11-28 | Discharge: 2017-11-28 | Disposition: A | Discharge status: Home / Self Care | Payer: Medicare Other | Source: Ambulatory Visit | Attending: Gastroenterology | Admitting: Gastroenterology

## 2017-11-28 DIAGNOSIS — D500 Iron deficiency anemia secondary to blood loss (chronic): Secondary | ICD-10-CM | POA: Insufficient documentation

## 2017-11-28 LAB — IRON AND TIBC
IRON: 251 ug/dL — AB (ref 28–170)
Saturation Ratios: 68 % — ABNORMAL HIGH (ref 10.4–31.8)
TIBC: 371 ug/dL (ref 250–450)
UIBC: 120 ug/dL

## 2017-11-28 LAB — CBC
HCT: 30.9 % — ABNORMAL LOW (ref 36.0–46.0)
Hemoglobin: 9 g/dL — ABNORMAL LOW (ref 12.0–15.0)
MCH: 21.4 pg — AB (ref 26.0–34.0)
MCHC: 29.1 g/dL — AB (ref 30.0–36.0)
MCV: 73.4 fL — ABNORMAL LOW (ref 78.0–100.0)
Platelets: 429 10*3/uL — ABNORMAL HIGH (ref 150–400)
RBC: 4.21 MIL/uL (ref 3.87–5.11)
RDW: 24 % — AB (ref 11.5–15.5)
WBC: 6.8 10*3/uL (ref 4.0–10.5)

## 2017-11-28 LAB — FERRITIN: FERRITIN: 4 ng/mL — AB (ref 11–307)

## 2017-11-28 MED ORDER — SODIUM CHLORIDE 0.9 % IV SOLN
200.0000 mg | INTRAVENOUS | Status: DC
  Administered 2017-11-28: 200 mg via INTRAVENOUS
  Filled 2017-11-28: qty 10

## 2017-12-03 DIAGNOSIS — D500 Iron deficiency anemia secondary to blood loss (chronic): Secondary | ICD-10-CM | POA: Diagnosis not present

## 2017-12-10 DIAGNOSIS — D510 Vitamin B12 deficiency anemia due to intrinsic factor deficiency: Secondary | ICD-10-CM | POA: Diagnosis not present

## 2017-12-26 ENCOUNTER — Ambulatory Visit (HOSPITAL_COMMUNITY)
Admission: RE | Admit: 2017-12-26 | Discharge: 2017-12-26 | Disposition: A | Discharge status: Home / Self Care | Payer: Medicare Other | Source: Ambulatory Visit | Attending: Gastroenterology | Admitting: Gastroenterology

## 2017-12-26 DIAGNOSIS — D500 Iron deficiency anemia secondary to blood loss (chronic): Secondary | ICD-10-CM | POA: Insufficient documentation

## 2017-12-26 LAB — CBC
HCT: 31.6 % — ABNORMAL LOW (ref 36.0–46.0)
Hemoglobin: 9.6 g/dL — ABNORMAL LOW (ref 12.0–15.0)
MCH: 23.5 pg — AB (ref 26.0–34.0)
MCHC: 30.4 g/dL (ref 30.0–36.0)
MCV: 77.3 fL — AB (ref 78.0–100.0)
PLATELETS: 339 10*3/uL (ref 150–400)
RBC: 4.09 MIL/uL (ref 3.87–5.11)
RDW: 23 % — ABNORMAL HIGH (ref 11.5–15.5)
WBC: 7.2 10*3/uL (ref 4.0–10.5)

## 2017-12-26 LAB — IRON AND TIBC
Iron: 19 ug/dL — ABNORMAL LOW (ref 28–170)
Saturation Ratios: 5 % — ABNORMAL LOW (ref 10.4–31.8)
TIBC: 374 ug/dL (ref 250–450)
UIBC: 355 ug/dL

## 2017-12-26 MED ORDER — SODIUM CHLORIDE 0.9 % IV SOLN
200.0000 mg | Freq: Once | INTRAVENOUS | Status: DC
  Administered 2017-12-26: 200 mg via INTRAVENOUS
  Filled 2017-12-26: qty 10

## 2018-01-14 DIAGNOSIS — D510 Vitamin B12 deficiency anemia due to intrinsic factor deficiency: Secondary | ICD-10-CM | POA: Diagnosis not present

## 2018-01-22 ENCOUNTER — Other Ambulatory Visit (HOSPITAL_COMMUNITY): Payer: Self-pay | Admitting: *Deleted

## 2018-01-23 ENCOUNTER — Ambulatory Visit (HOSPITAL_COMMUNITY)
Admission: RE | Admit: 2018-01-23 | Discharge: 2018-01-23 | Disposition: A | Discharge status: Home / Self Care | Payer: Medicare Other | Source: Ambulatory Visit | Attending: Gastroenterology | Admitting: Gastroenterology

## 2018-01-23 DIAGNOSIS — D500 Iron deficiency anemia secondary to blood loss (chronic): Secondary | ICD-10-CM | POA: Insufficient documentation

## 2018-01-23 LAB — CBC
HCT: 31.6 % — ABNORMAL LOW (ref 36.0–46.0)
HEMOGLOBIN: 9.3 g/dL — AB (ref 12.0–15.0)
MCH: 23.2 pg — ABNORMAL LOW (ref 26.0–34.0)
MCHC: 29.4 g/dL — ABNORMAL LOW (ref 30.0–36.0)
MCV: 78.8 fL (ref 78.0–100.0)
PLATELETS: 334 10*3/uL (ref 150–400)
RBC: 4.01 MIL/uL (ref 3.87–5.11)
RDW: 20.4 % — ABNORMAL HIGH (ref 11.5–15.5)
WBC: 6.9 10*3/uL (ref 4.0–10.5)

## 2018-01-23 LAB — FERRITIN: Ferritin: 11 ng/mL (ref 11–307)

## 2018-01-23 LAB — IRON AND TIBC
IRON: 30 ug/dL (ref 28–170)
SATURATION RATIOS: 8 % — AB (ref 10.4–31.8)
TIBC: 372 ug/dL (ref 250–450)
UIBC: 342 ug/dL

## 2018-01-23 MED ORDER — SODIUM CHLORIDE 0.9 % IV SOLN
200.0000 mg | Freq: Once | INTRAVENOUS | Status: AC
  Administered 2018-01-23: 200 mg via INTRAVENOUS
  Filled 2018-01-23: qty 10

## 2018-02-11 DIAGNOSIS — D510 Vitamin B12 deficiency anemia due to intrinsic factor deficiency: Secondary | ICD-10-CM | POA: Diagnosis not present

## 2018-02-19 ENCOUNTER — Other Ambulatory Visit (HOSPITAL_COMMUNITY): Payer: Self-pay | Admitting: *Deleted

## 2018-02-20 ENCOUNTER — Ambulatory Visit (HOSPITAL_COMMUNITY)
Admission: RE | Admit: 2018-02-20 | Discharge: 2018-02-20 | Disposition: A | Discharge status: Home / Self Care | Payer: Medicare Other | Source: Ambulatory Visit | Attending: Gastroenterology | Admitting: Gastroenterology

## 2018-02-20 DIAGNOSIS — D500 Iron deficiency anemia secondary to blood loss (chronic): Secondary | ICD-10-CM | POA: Insufficient documentation

## 2018-02-20 LAB — CBC
HEMATOCRIT: 32.7 % — AB (ref 36.0–46.0)
Hemoglobin: 9.9 g/dL — ABNORMAL LOW (ref 12.0–15.0)
MCH: 25.1 pg — ABNORMAL LOW (ref 26.0–34.0)
MCHC: 30.3 g/dL (ref 30.0–36.0)
MCV: 82.8 fL (ref 78.0–100.0)
Platelets: 304 10*3/uL (ref 150–400)
RBC: 3.95 MIL/uL (ref 3.87–5.11)
RDW: 19.1 % — AB (ref 11.5–15.5)
WBC: 6.9 10*3/uL (ref 4.0–10.5)

## 2018-02-20 LAB — IRON AND TIBC
IRON: 26 ug/dL — AB (ref 28–170)
Saturation Ratios: 8 % — ABNORMAL LOW (ref 10.4–31.8)
TIBC: 344 ug/dL (ref 250–450)
UIBC: 318 ug/dL

## 2018-02-20 MED ORDER — SODIUM CHLORIDE 0.9 % IV SOLN: 200.0000 mg | INTRAVENOUS | Status: DC

## 2018-02-20 MED ORDER — SODIUM CHLORIDE 0.9 % IV SOLN
200.0000 mg | Freq: Once | INTRAVENOUS | Status: DC
  Administered 2018-02-20: 200 mg via INTRAVENOUS
  Filled 2018-02-20: qty 10

## 2018-02-27 DIAGNOSIS — D509 Iron deficiency anemia, unspecified: Secondary | ICD-10-CM | POA: Diagnosis not present

## 2018-03-17 DIAGNOSIS — D510 Vitamin B12 deficiency anemia due to intrinsic factor deficiency: Secondary | ICD-10-CM | POA: Diagnosis not present

## 2018-03-20 ENCOUNTER — Ambulatory Visit (HOSPITAL_COMMUNITY)
Admission: RE | Admit: 2018-03-20 | Discharge: 2018-03-20 | Disposition: A | Discharge status: Home / Self Care | Payer: Medicare Other | Source: Ambulatory Visit | Attending: Gastroenterology | Admitting: Gastroenterology

## 2018-03-20 DIAGNOSIS — D500 Iron deficiency anemia secondary to blood loss (chronic): Secondary | ICD-10-CM | POA: Diagnosis not present

## 2018-03-20 LAB — IRON AND TIBC
Iron: 24 ug/dL — ABNORMAL LOW (ref 28–170)
Saturation Ratios: 8 % — ABNORMAL LOW (ref 10.4–31.8)
TIBC: 301 ug/dL (ref 250–450)
UIBC: 277 ug/dL

## 2018-03-20 LAB — CBC
HCT: 35.2 % — ABNORMAL LOW (ref 36.0–46.0)
HEMOGLOBIN: 10.8 g/dL — AB (ref 12.0–15.0)
MCH: 25.7 pg — AB (ref 26.0–34.0)
MCHC: 30.7 g/dL (ref 30.0–36.0)
MCV: 83.8 fL (ref 78.0–100.0)
Platelets: 240 10*3/uL (ref 150–400)
RBC: 4.2 MIL/uL (ref 3.87–5.11)
RDW: 17.9 % — ABNORMAL HIGH (ref 11.5–15.5)
WBC: 5.9 10*3/uL (ref 4.0–10.5)

## 2018-03-20 MED ORDER — IRON SUCROSE 20 MG/ML IV SOLN
200.0000 mg | Freq: Once | INTRAVENOUS | Status: AC
  Administered 2018-03-20: 09:00:00 200 mg via INTRAVENOUS
  Filled 2018-03-20: qty 10

## 2018-03-22 DIAGNOSIS — J189 Pneumonia, unspecified organism: Secondary | ICD-10-CM | POA: Diagnosis not present

## 2018-04-03 DIAGNOSIS — J189 Pneumonia, unspecified organism: Secondary | ICD-10-CM | POA: Diagnosis not present

## 2018-04-15 DIAGNOSIS — D510 Vitamin B12 deficiency anemia due to intrinsic factor deficiency: Secondary | ICD-10-CM | POA: Diagnosis not present

## 2018-04-17 ENCOUNTER — Encounter (HOSPITAL_COMMUNITY): Payer: Medicare Other

## 2018-04-24 ENCOUNTER — Ambulatory Visit (HOSPITAL_COMMUNITY)
Admission: RE | Admit: 2018-04-24 | Discharge: 2018-04-24 | Disposition: A | Discharge status: Home / Self Care | Payer: Medicare Other | Source: Ambulatory Visit | Attending: Gastroenterology | Admitting: Gastroenterology

## 2018-04-24 DIAGNOSIS — D500 Iron deficiency anemia secondary to blood loss (chronic): Secondary | ICD-10-CM | POA: Diagnosis not present

## 2018-04-24 LAB — IRON AND TIBC
IRON: 44 ug/dL (ref 28–170)
SATURATION RATIOS: 14 % (ref 10.4–31.8)
TIBC: 316 ug/dL (ref 250–450)
UIBC: 272 ug/dL

## 2018-04-24 LAB — CBC
HCT: 37.9 % (ref 36.0–46.0)
HEMOGLOBIN: 11.6 g/dL — AB (ref 12.0–15.0)
MCH: 26.3 pg (ref 26.0–34.0)
MCHC: 30.6 g/dL (ref 30.0–36.0)
MCV: 85.9 fL (ref 78.0–100.0)
Platelets: 317 10*3/uL (ref 150–400)
RBC: 4.41 MIL/uL (ref 3.87–5.11)
RDW: 15.9 % — AB (ref 11.5–15.5)
WBC: 7.3 10*3/uL (ref 4.0–10.5)

## 2018-04-24 LAB — FERRITIN: FERRITIN: 22 ng/mL (ref 11–307)

## 2018-04-24 MED ORDER — SODIUM CHLORIDE 0.9 % IV SOLN
125.0000 mg | Freq: Once | INTRAVENOUS | Status: DC
  Filled 2018-04-24: qty 10

## 2018-04-24 MED ORDER — SODIUM CHLORIDE 0.9 % IV SOLN
200.0000 mg | Freq: Once | INTRAVENOUS | Status: AC
  Administered 2018-04-24: 200 mg via INTRAVENOUS
  Filled 2018-04-24: qty 10

## 2018-04-24 MED ORDER — SODIUM CHLORIDE 0.9 % IV SOLN
100.0000 mg | Freq: Once | INTRAVENOUS | Status: DC
  Filled 2018-04-24: qty 5

## 2018-04-25 DIAGNOSIS — J189 Pneumonia, unspecified organism: Secondary | ICD-10-CM | POA: Diagnosis not present

## 2018-05-01 DIAGNOSIS — H524 Presbyopia: Secondary | ICD-10-CM | POA: Diagnosis not present

## 2018-05-01 DIAGNOSIS — Z961 Presence of intraocular lens: Secondary | ICD-10-CM | POA: Diagnosis not present

## 2018-05-01 DIAGNOSIS — E119 Type 2 diabetes mellitus without complications: Secondary | ICD-10-CM | POA: Diagnosis not present

## 2018-05-01 DIAGNOSIS — H52203 Unspecified astigmatism, bilateral: Secondary | ICD-10-CM | POA: Diagnosis not present

## 2018-05-01 DIAGNOSIS — Z7984 Long term (current) use of oral hypoglycemic drugs: Secondary | ICD-10-CM | POA: Diagnosis not present

## 2018-05-12 DIAGNOSIS — M810 Age-related osteoporosis without current pathological fracture: Secondary | ICD-10-CM | POA: Diagnosis not present

## 2018-05-12 DIAGNOSIS — Z0000 Encounter for general adult medical examination without abnormal findings: Secondary | ICD-10-CM | POA: Diagnosis not present

## 2018-05-12 DIAGNOSIS — Z1389 Encounter for screening for other disorder: Secondary | ICD-10-CM | POA: Diagnosis not present

## 2018-05-12 DIAGNOSIS — R011 Cardiac murmur, unspecified: Secondary | ICD-10-CM | POA: Diagnosis not present

## 2018-05-12 DIAGNOSIS — D0510 Intraductal carcinoma in situ of unspecified breast: Secondary | ICD-10-CM | POA: Diagnosis not present

## 2018-05-12 DIAGNOSIS — E039 Hypothyroidism, unspecified: Secondary | ICD-10-CM | POA: Diagnosis not present

## 2018-05-12 DIAGNOSIS — E7800 Pure hypercholesterolemia, unspecified: Secondary | ICD-10-CM | POA: Diagnosis not present

## 2018-05-12 DIAGNOSIS — E1121 Type 2 diabetes mellitus with diabetic nephropathy: Secondary | ICD-10-CM | POA: Diagnosis not present

## 2018-05-12 DIAGNOSIS — D510 Vitamin B12 deficiency anemia due to intrinsic factor deficiency: Secondary | ICD-10-CM | POA: Diagnosis not present

## 2018-05-12 DIAGNOSIS — N183 Chronic kidney disease, stage 3 (moderate): Secondary | ICD-10-CM | POA: Diagnosis not present

## 2018-05-12 DIAGNOSIS — D509 Iron deficiency anemia, unspecified: Secondary | ICD-10-CM | POA: Diagnosis not present

## 2018-05-12 DIAGNOSIS — I129 Hypertensive chronic kidney disease with stage 1 through stage 4 chronic kidney disease, or unspecified chronic kidney disease: Secondary | ICD-10-CM | POA: Diagnosis not present

## 2018-05-23 ENCOUNTER — Encounter (HOSPITAL_COMMUNITY): Payer: Medicare Other

## 2018-05-29 ENCOUNTER — Ambulatory Visit (HOSPITAL_COMMUNITY)
Admission: RE | Admit: 2018-05-29 | Discharge: 2018-05-29 | Disposition: A | Discharge status: Home / Self Care | Payer: Medicare Other | Source: Ambulatory Visit | Attending: Gastroenterology | Admitting: Gastroenterology

## 2018-05-29 DIAGNOSIS — D500 Iron deficiency anemia secondary to blood loss (chronic): Secondary | ICD-10-CM | POA: Diagnosis not present

## 2018-05-29 LAB — IRON AND TIBC
IRON: 43 ug/dL (ref 28–170)
Saturation Ratios: 13 % (ref 10.4–31.8)
TIBC: 332 ug/dL (ref 250–450)
UIBC: 289 ug/dL

## 2018-05-29 LAB — FERRITIN: Ferritin: 27 ng/mL (ref 11–307)

## 2018-05-29 LAB — CBC
HCT: 38.9 % (ref 36.0–46.0)
Hemoglobin: 11.9 g/dL — ABNORMAL LOW (ref 12.0–15.0)
MCH: 27.4 pg (ref 26.0–34.0)
MCHC: 30.6 g/dL (ref 30.0–36.0)
MCV: 89.6 fL (ref 78.0–100.0)
PLATELETS: 273 10*3/uL (ref 150–400)
RBC: 4.34 MIL/uL (ref 3.87–5.11)
RDW: 14.7 % (ref 11.5–15.5)
WBC: 7.4 10*3/uL (ref 4.0–10.5)

## 2018-05-29 MED ORDER — SODIUM CHLORIDE 0.9 % IV SOLN
200.0000 mg | Freq: Once | INTRAVENOUS | Status: AC
  Administered 2018-05-29: 200 mg via INTRAVENOUS
  Filled 2018-05-29: qty 10

## 2018-06-09 ENCOUNTER — Other Ambulatory Visit: Payer: Self-pay

## 2018-06-09 ENCOUNTER — Emergency Department (HOSPITAL_COMMUNITY)
Admission: EM | Admit: 2018-06-09 | Discharge: 2018-06-10 | Disposition: A | Discharge status: Home / Self Care | Payer: Medicare Other | Source: Home / Self Care | Attending: Emergency Medicine | Admitting: Emergency Medicine

## 2018-06-09 ENCOUNTER — Encounter (HOSPITAL_COMMUNITY): Payer: Self-pay | Admitting: Emergency Medicine

## 2018-06-09 DIAGNOSIS — E871 Hypo-osmolality and hyponatremia: Secondary | ICD-10-CM | POA: Diagnosis not present

## 2018-06-09 DIAGNOSIS — I10 Essential (primary) hypertension: Secondary | ICD-10-CM | POA: Insufficient documentation

## 2018-06-09 DIAGNOSIS — E119 Type 2 diabetes mellitus without complications: Secondary | ICD-10-CM

## 2018-06-09 DIAGNOSIS — E039 Hypothyroidism, unspecified: Secondary | ICD-10-CM | POA: Insufficient documentation

## 2018-06-09 DIAGNOSIS — R479 Unspecified speech disturbances: Secondary | ICD-10-CM

## 2018-06-09 DIAGNOSIS — Z79899 Other long term (current) drug therapy: Secondary | ICD-10-CM

## 2018-06-09 DIAGNOSIS — F0150 Vascular dementia without behavioral disturbance: Secondary | ICD-10-CM | POA: Diagnosis not present

## 2018-06-09 DIAGNOSIS — R4789 Other speech disturbances: Secondary | ICD-10-CM | POA: Diagnosis not present

## 2018-06-09 DIAGNOSIS — Z7982 Long term (current) use of aspirin: Secondary | ICD-10-CM | POA: Insufficient documentation

## 2018-06-09 DIAGNOSIS — Z7984 Long term (current) use of oral hypoglycemic drugs: Secondary | ICD-10-CM | POA: Insufficient documentation

## 2018-06-09 DIAGNOSIS — I639 Cerebral infarction, unspecified: Secondary | ICD-10-CM | POA: Diagnosis not present

## 2018-06-09 DIAGNOSIS — E1122 Type 2 diabetes mellitus with diabetic chronic kidney disease: Secondary | ICD-10-CM | POA: Diagnosis not present

## 2018-06-09 DIAGNOSIS — D510 Vitamin B12 deficiency anemia due to intrinsic factor deficiency: Secondary | ICD-10-CM | POA: Diagnosis not present

## 2018-06-09 DIAGNOSIS — Z86718 Personal history of other venous thrombosis and embolism: Secondary | ICD-10-CM

## 2018-06-09 DIAGNOSIS — R4182 Altered mental status, unspecified: Secondary | ICD-10-CM | POA: Diagnosis not present

## 2018-06-09 DIAGNOSIS — R4701 Aphasia: Secondary | ICD-10-CM | POA: Diagnosis not present

## 2018-06-09 DIAGNOSIS — E538 Deficiency of other specified B group vitamins: Secondary | ICD-10-CM | POA: Diagnosis not present

## 2018-06-09 DIAGNOSIS — I674 Hypertensive encephalopathy: Secondary | ICD-10-CM | POA: Diagnosis not present

## 2018-06-09 DIAGNOSIS — R410 Disorientation, unspecified: Secondary | ICD-10-CM | POA: Diagnosis not present

## 2018-06-09 DIAGNOSIS — R4020 Unspecified coma: Secondary | ICD-10-CM | POA: Diagnosis not present

## 2018-06-09 NOTE — ED Triage Notes (Signed)
Pt states she called her son today because she "can't get her thoughts together" Some dizziness. No appetite. Pt son states speech is different. Unsteady on her feet. No falls.  Pt states symptoms began Sunday.

## 2018-06-10 ENCOUNTER — Inpatient Hospital Stay (HOSPITAL_COMMUNITY)
Admission: EM | Admit: 2018-06-10 | Discharge: 2018-06-16 | DRG: 078 | Disposition: A | Discharge status: Skilled Nursing Facility | Payer: Medicare Other | Attending: Internal Medicine | Admitting: Internal Medicine

## 2018-06-10 ENCOUNTER — Encounter (HOSPITAL_COMMUNITY): Payer: Self-pay | Admitting: Emergency Medicine

## 2018-06-10 ENCOUNTER — Other Ambulatory Visit: Payer: Self-pay

## 2018-06-10 ENCOUNTER — Emergency Department (HOSPITAL_COMMUNITY): Payer: Medicare Other

## 2018-06-10 DIAGNOSIS — I6521 Occlusion and stenosis of right carotid artery: Secondary | ICD-10-CM

## 2018-06-10 DIAGNOSIS — Z79899 Other long term (current) drug therapy: Secondary | ICD-10-CM

## 2018-06-10 DIAGNOSIS — E785 Hyperlipidemia, unspecified: Secondary | ICD-10-CM | POA: Diagnosis present

## 2018-06-10 DIAGNOSIS — G9341 Metabolic encephalopathy: Secondary | ICD-10-CM | POA: Diagnosis not present

## 2018-06-10 DIAGNOSIS — E890 Postprocedural hypothyroidism: Secondary | ICD-10-CM | POA: Diagnosis present

## 2018-06-10 DIAGNOSIS — R112 Nausea with vomiting, unspecified: Secondary | ICD-10-CM | POA: Diagnosis not present

## 2018-06-10 DIAGNOSIS — R4701 Aphasia: Secondary | ICD-10-CM | POA: Diagnosis present

## 2018-06-10 DIAGNOSIS — R412 Retrograde amnesia: Secondary | ICD-10-CM | POA: Diagnosis not present

## 2018-06-10 DIAGNOSIS — E1122 Type 2 diabetes mellitus with diabetic chronic kidney disease: Secondary | ICD-10-CM | POA: Diagnosis present

## 2018-06-10 DIAGNOSIS — E876 Hypokalemia: Secondary | ICD-10-CM | POA: Diagnosis not present

## 2018-06-10 DIAGNOSIS — R4781 Slurred speech: Secondary | ICD-10-CM | POA: Diagnosis present

## 2018-06-10 DIAGNOSIS — Z9013 Acquired absence of bilateral breasts and nipples: Secondary | ICD-10-CM

## 2018-06-10 DIAGNOSIS — R404 Transient alteration of awareness: Secondary | ICD-10-CM | POA: Diagnosis not present

## 2018-06-10 DIAGNOSIS — Z853 Personal history of malignant neoplasm of breast: Secondary | ICD-10-CM

## 2018-06-10 DIAGNOSIS — R4182 Altered mental status, unspecified: Secondary | ICD-10-CM | POA: Diagnosis not present

## 2018-06-10 DIAGNOSIS — I674 Hypertensive encephalopathy: Principal | ICD-10-CM | POA: Diagnosis present

## 2018-06-10 DIAGNOSIS — R410 Disorientation, unspecified: Secondary | ICD-10-CM | POA: Diagnosis not present

## 2018-06-10 DIAGNOSIS — E871 Hypo-osmolality and hyponatremia: Secondary | ICD-10-CM | POA: Diagnosis present

## 2018-06-10 DIAGNOSIS — Z9114 Patient's other noncompliance with medication regimen: Secondary | ICD-10-CM

## 2018-06-10 DIAGNOSIS — N183 Chronic kidney disease, stage 3 (moderate): Secondary | ICD-10-CM | POA: Diagnosis present

## 2018-06-10 DIAGNOSIS — E538 Deficiency of other specified B group vitamins: Secondary | ICD-10-CM | POA: Insufficient documentation

## 2018-06-10 DIAGNOSIS — I129 Hypertensive chronic kidney disease with stage 1 through stage 4 chronic kidney disease, or unspecified chronic kidney disease: Secondary | ICD-10-CM | POA: Diagnosis present

## 2018-06-10 DIAGNOSIS — E860 Dehydration: Secondary | ICD-10-CM | POA: Diagnosis present

## 2018-06-10 DIAGNOSIS — I10 Essential (primary) hypertension: Secondary | ICD-10-CM | POA: Diagnosis present

## 2018-06-10 DIAGNOSIS — R4020 Unspecified coma: Secondary | ICD-10-CM | POA: Diagnosis not present

## 2018-06-10 DIAGNOSIS — Z7984 Long term (current) use of oral hypoglycemic drugs: Secondary | ICD-10-CM

## 2018-06-10 DIAGNOSIS — F0150 Vascular dementia without behavioral disturbance: Secondary | ICD-10-CM | POA: Diagnosis present

## 2018-06-10 DIAGNOSIS — Z888 Allergy status to other drugs, medicaments and biological substances status: Secondary | ICD-10-CM

## 2018-06-10 DIAGNOSIS — D638 Anemia in other chronic diseases classified elsewhere: Secondary | ICD-10-CM | POA: Diagnosis present

## 2018-06-10 DIAGNOSIS — I639 Cerebral infarction, unspecified: Secondary | ICD-10-CM | POA: Diagnosis not present

## 2018-06-10 DIAGNOSIS — E1151 Type 2 diabetes mellitus with diabetic peripheral angiopathy without gangrene: Secondary | ICD-10-CM | POA: Diagnosis present

## 2018-06-10 DIAGNOSIS — M810 Age-related osteoporosis without current pathological fracture: Secondary | ICD-10-CM | POA: Diagnosis present

## 2018-06-10 DIAGNOSIS — Z885 Allergy status to narcotic agent status: Secondary | ICD-10-CM

## 2018-06-10 DIAGNOSIS — I6601 Occlusion and stenosis of right middle cerebral artery: Secondary | ICD-10-CM | POA: Diagnosis present

## 2018-06-10 LAB — CBC
HEMATOCRIT: 35.6 % — AB (ref 36.0–46.0)
Hemoglobin: 11.2 g/dL — ABNORMAL LOW (ref 12.0–15.0)
MCH: 27.6 pg (ref 26.0–34.0)
MCHC: 31.5 g/dL (ref 30.0–36.0)
MCV: 87.7 fL (ref 78.0–100.0)
Platelets: 238 10*3/uL (ref 150–400)
RBC: 4.06 MIL/uL (ref 3.87–5.11)
RDW: 14.3 % (ref 11.5–15.5)
WBC: 8.6 10*3/uL (ref 4.0–10.5)

## 2018-06-10 LAB — CBC WITH DIFFERENTIAL/PLATELET
ABS IMMATURE GRANULOCYTES: 0 10*3/uL (ref 0.0–0.1)
Basophils Absolute: 0 10*3/uL (ref 0.0–0.1)
Basophils Relative: 1 %
EOS ABS: 0 10*3/uL (ref 0.0–0.7)
Eosinophils Relative: 0 %
HEMATOCRIT: 36.3 % (ref 36.0–46.0)
Hemoglobin: 11.4 g/dL — ABNORMAL LOW (ref 12.0–15.0)
IMMATURE GRANULOCYTES: 1 %
Lymphocytes Relative: 11 %
Lymphs Abs: 1 10*3/uL (ref 0.7–4.0)
MCH: 27.4 pg (ref 26.0–34.0)
MCHC: 31.4 g/dL (ref 30.0–36.0)
MCV: 87.3 fL (ref 78.0–100.0)
MONOS PCT: 8 %
Monocytes Absolute: 0.7 10*3/uL (ref 0.1–1.0)
NEUTROS PCT: 79 %
Neutro Abs: 7.1 10*3/uL (ref 1.7–7.7)
Platelets: 258 10*3/uL (ref 150–400)
RBC: 4.16 MIL/uL (ref 3.87–5.11)
RDW: 14.5 % (ref 11.5–15.5)
WBC: 8.9 10*3/uL (ref 4.0–10.5)

## 2018-06-10 LAB — AMMONIA: Ammonia: 12 umol/L (ref 9–35)

## 2018-06-10 LAB — COMPREHENSIVE METABOLIC PANEL
ALBUMIN: 3.8 g/dL (ref 3.5–5.0)
ALK PHOS: 40 U/L (ref 38–126)
ALT: 16 U/L (ref 0–44)
ANION GAP: 12 (ref 5–15)
AST: 17 U/L (ref 15–41)
BILIRUBIN TOTAL: 0.6 mg/dL (ref 0.3–1.2)
BUN: 18 mg/dL (ref 8–23)
CALCIUM: 9.2 mg/dL (ref 8.9–10.3)
CO2: 28 mmol/L (ref 22–32)
Chloride: 94 mmol/L — ABNORMAL LOW (ref 98–111)
Creatinine, Ser: 1.5 mg/dL — ABNORMAL HIGH (ref 0.44–1.00)
GFR calc Af Amer: 36 mL/min — ABNORMAL LOW (ref >60)
GFR calc non Af Amer: 31 mL/min — ABNORMAL LOW (ref >60)
GLUCOSE: 148 mg/dL — AB (ref 70–99)
Potassium: 3.8 mmol/L (ref 3.5–5.1)
Sodium: 134 mmol/L — ABNORMAL LOW (ref 135–145)
TOTAL PROTEIN: 6.8 g/dL (ref 6.5–8.1)

## 2018-06-10 LAB — I-STAT CHEM 8, ED
BUN: 16 mg/dL (ref 8–23)
BUN: 20 mg/dL (ref 8–23)
CALCIUM ION: 1.1 mmol/L — AB (ref 1.15–1.40)
CALCIUM ION: 1.1 mmol/L — AB (ref 1.15–1.40)
CHLORIDE: 90 mmol/L — AB (ref 98–111)
CHLORIDE: 93 mmol/L — AB (ref 98–111)
CREATININE: 1.4 mg/dL — AB (ref 0.44–1.00)
CREATININE: 1.5 mg/dL — AB (ref 0.44–1.00)
GLUCOSE: 141 mg/dL — AB (ref 70–99)
Glucose, Bld: 144 mg/dL — ABNORMAL HIGH (ref 70–99)
HCT: 34 % — ABNORMAL LOW (ref 36.0–46.0)
HCT: 35 % — ABNORMAL LOW (ref 36.0–46.0)
HEMOGLOBIN: 11.6 g/dL — AB (ref 12.0–15.0)
HEMOGLOBIN: 11.9 g/dL — AB (ref 12.0–15.0)
POTASSIUM: 3.9 mmol/L (ref 3.5–5.1)
Potassium: 3.7 mmol/L (ref 3.5–5.1)
Sodium: 131 mmol/L — ABNORMAL LOW (ref 135–145)
Sodium: 133 mmol/L — ABNORMAL LOW (ref 135–145)
TCO2: 29 mmol/L (ref 22–32)
TCO2: 30 mmol/L (ref 22–32)

## 2018-06-10 LAB — URINALYSIS, ROUTINE W REFLEX MICROSCOPIC
BILIRUBIN URINE: NEGATIVE
GLUCOSE, UA: NEGATIVE mg/dL
Hgb urine dipstick: NEGATIVE
Ketones, ur: NEGATIVE mg/dL
Leukocytes, UA: NEGATIVE
NITRITE: NEGATIVE
PH: 7 (ref 5.0–8.0)
Protein, ur: NEGATIVE mg/dL
Specific Gravity, Urine: 1.014 (ref 1.005–1.030)

## 2018-06-10 LAB — I-STAT TROPONIN, ED
Troponin i, poc: 0 ng/mL (ref 0.00–0.08)
Troponin i, poc: 0.01 ng/mL (ref 0.00–0.08)

## 2018-06-10 LAB — DIFFERENTIAL
Abs Immature Granulocytes: 0 10*3/uL (ref 0.0–0.1)
Basophils Absolute: 0 10*3/uL (ref 0.0–0.1)
Basophils Relative: 0 %
EOS PCT: 1 %
Eosinophils Absolute: 0 10*3/uL (ref 0.0–0.7)
Immature Granulocytes: 0 %
LYMPHS ABS: 1.5 10*3/uL (ref 0.7–4.0)
LYMPHS PCT: 18 %
MONOS PCT: 10 %
Monocytes Absolute: 0.8 10*3/uL (ref 0.1–1.0)
Neutro Abs: 6.2 10*3/uL (ref 1.7–7.7)
Neutrophils Relative %: 71 %

## 2018-06-10 LAB — HEPATIC FUNCTION PANEL
ALK PHOS: 31 U/L — AB (ref 38–126)
ALT: 13 U/L (ref 0–44)
AST: 16 U/L (ref 15–41)
Albumin: 2.7 g/dL — ABNORMAL LOW (ref 3.5–5.0)
BILIRUBIN DIRECT: 0.1 mg/dL (ref 0.0–0.2)
BILIRUBIN INDIRECT: 0.7 mg/dL (ref 0.3–0.9)
Total Bilirubin: 0.8 mg/dL (ref 0.3–1.2)
Total Protein: 4.9 g/dL — ABNORMAL LOW (ref 6.5–8.1)

## 2018-06-10 LAB — TSH: TSH: 0.511 u[IU]/mL (ref 0.350–4.500)

## 2018-06-10 LAB — PROTIME-INR
INR: 1
Prothrombin Time: 13.1 seconds (ref 11.4–15.2)

## 2018-06-10 LAB — CBG MONITORING, ED
Glucose-Capillary: 129 mg/dL — ABNORMAL HIGH (ref 70–99)
Glucose-Capillary: 131 mg/dL — ABNORMAL HIGH (ref 70–99)

## 2018-06-10 LAB — APTT: aPTT: 28 seconds (ref 24–36)

## 2018-06-10 MED ORDER — HYDRALAZINE HCL 20 MG/ML IJ SOLN
20.0000 mg | INTRAMUSCULAR | Status: DC | PRN
  Administered 2018-06-12 – 2018-06-13 (×3): 20 mg via INTRAVENOUS
  Filled 2018-06-10 (×5): qty 1

## 2018-06-10 MED ORDER — CYANOCOBALAMIN 1000 MCG/ML IJ SOLN: 1000.0000 ug | INTRAMUSCULAR | Status: DC

## 2018-06-10 MED ORDER — ASPIRIN EC 81 MG PO TBEC
81.0000 mg | DELAYED_RELEASE_TABLET | Freq: Once | ORAL | Status: AC
  Administered 2018-06-10: 81 mg via ORAL
  Filled 2018-06-10: qty 1

## 2018-06-10 MED ORDER — ASPIRIN EC 81 MG PO TBEC: 81.0000 mg | DELAYED_RELEASE_TABLET | Freq: Every day | ORAL | 3 refills | Status: DC

## 2018-06-10 MED ORDER — SODIUM CHLORIDE 0.9 % IV BOLUS
1000.0000 mL | Freq: Once | INTRAVENOUS | Status: AC
  Administered 2018-06-10: 1000 mL via INTRAVENOUS

## 2018-06-10 MED ORDER — HYDROCHLOROTHIAZIDE 25 MG PO TABS
25.0000 mg | ORAL_TABLET | Freq: Every day | ORAL | Status: DC
  Administered 2018-06-11 – 2018-06-12 (×2): 25 mg via ORAL
  Filled 2018-06-10 (×2): qty 1

## 2018-06-10 MED ORDER — FERROUS SULFATE 325 (65 FE) MG PO TABS
325.0000 mg | ORAL_TABLET | Freq: Two times a day (BID) | ORAL | Status: DC
  Administered 2018-06-11 – 2018-06-12 (×3): 325 mg via ORAL
  Filled 2018-06-10 (×3): qty 1

## 2018-06-10 MED ORDER — LOSARTAN POTASSIUM 50 MG PO TABS
100.0000 mg | ORAL_TABLET | Freq: Every day | ORAL | Status: DC
  Administered 2018-06-11 – 2018-06-16 (×6): 100 mg via ORAL
  Filled 2018-06-10 (×6): qty 2

## 2018-06-10 MED ORDER — LORAZEPAM 2 MG/ML IJ SOLN
0.5000 mg | INTRAMUSCULAR | Status: DC | PRN
  Administered 2018-06-10: 0.5 mg via INTRAVENOUS
  Filled 2018-06-10: qty 1

## 2018-06-10 MED ORDER — CALCIUM CARBONATE-VITAMIN D 500-200 MG-UNIT PO TABS
1.0000 | ORAL_TABLET | Freq: Every morning | ORAL | Status: DC
Start: 2018-06-11 — End: 2018-06-16
  Administered 2018-06-11 – 2018-06-16 (×6): 1 via ORAL
  Filled 2018-06-10 (×6): qty 1

## 2018-06-10 MED ORDER — EZETIMIBE 10 MG PO TABS
10.0000 mg | ORAL_TABLET | Freq: Every day | ORAL | Status: DC
  Administered 2018-06-11 – 2018-06-16 (×6): 10 mg via ORAL
  Filled 2018-06-10 (×6): qty 1

## 2018-06-10 MED ORDER — LEVOTHYROXINE SODIUM 112 MCG PO TABS
112.0000 ug | ORAL_TABLET | Freq: Every day | ORAL | Status: DC
  Administered 2018-06-11 – 2018-06-16 (×6): 112 ug via ORAL
  Filled 2018-06-10 (×6): qty 1

## 2018-06-10 MED ORDER — SODIUM CHLORIDE 0.9 % IV SOLN
INTRAVENOUS | Status: DC
  Administered 2018-06-10: via INTRAVENOUS
  Administered 2018-06-11: 1000 mL via INTRAVENOUS
  Administered 2018-06-11: 22:00:00 via INTRAVENOUS
  Administered 2018-06-12 (×2): 1000 mL via INTRAVENOUS
  Administered 2018-06-13: 03:00:00 via INTRAVENOUS

## 2018-06-10 MED ORDER — ASPIRIN EC 81 MG PO TBEC
81.0000 mg | DELAYED_RELEASE_TABLET | Freq: Every day | ORAL | Status: DC
  Administered 2018-06-10 – 2018-06-16 (×7): 81 mg via ORAL
  Filled 2018-06-10 (×7): qty 1

## 2018-06-10 MED ORDER — GLIPIZIDE ER 5 MG PO TB24
5.0000 mg | ORAL_TABLET | Freq: Every day | ORAL | Status: DC
  Administered 2018-06-11 – 2018-06-16 (×5): 5 mg via ORAL
  Filled 2018-06-10 (×6): qty 1

## 2018-06-10 MED ORDER — METOPROLOL SUCCINATE ER 100 MG PO TB24
100.0000 mg | ORAL_TABLET | Freq: Every day | ORAL | Status: DC
  Administered 2018-06-10 – 2018-06-12 (×2): 100 mg via ORAL
  Filled 2018-06-10 (×3): qty 1

## 2018-06-10 MED ORDER — ADULT MULTIVITAMIN W/MINERALS CH
1.0000 | ORAL_TABLET | Freq: Every morning | ORAL | Status: DC
Start: 2018-06-11 — End: 2018-06-16
  Administered 2018-06-11 – 2018-06-16 (×6): 1 via ORAL
  Filled 2018-06-10 (×6): qty 1

## 2018-06-10 MED ORDER — LOSARTAN POTASSIUM-HCTZ 100-25 MG PO TABS: 1.0000 | ORAL_TABLET | Freq: Every day | ORAL | Status: DC

## 2018-06-10 NOTE — ED Notes (Signed)
Pt returned from X-ray.  

## 2018-06-10 NOTE — ED Notes (Signed)
Patient transported to MRI 

## 2018-06-10 NOTE — ED Triage Notes (Signed)
Son stated, when I was talking to her on the phone couldn't get her words out right.  Today when we were helping her to the bathroom it had a really bad smell to it.  Pt was here yesterday / last night  And they did every thing but a urine specimen.

## 2018-06-10 NOTE — H&P (Signed)
History and Physical    Alisha [name]:[id number] DOB: March 12, 1932 DOA: 06/10/2018  PCP: Kelton Pillar, MD  Patient coming from: Home  Chief Complaint: Confusion  HPI: Alisha [name] is a 84 y.o. female with medical history significant of hypertension, diabetes brought in by family for the second time in the last 24 hours for confusion.  Patient at baseline is normal lives alone and highly functional.  She has not had any fevers.  She has not had any nausea vomiting or diarrhea.  She is been confused going on 48 hours and has not taken any of her blood pressure medications.  Family brought her in yesterday where she had an extensive work-up including MRI of her brain which did not show any stroke and she was sent home.  They brought her back today because she is not any better.  Patient has not been complaining of any pain.  She has not had any shortness of breath or cough.  She has not had any lower extremity edema or swelling.  She is again had repeat studies done today which again are not revealing of what is causing her confusion.  Neurology was called who recommended this could be due to dehydration and observing overnight.  If she still confused in the morning to consult them again.  Patient is being referred for admission for encephalopathy of unclear etiology.  Review of Systems: As per HPI otherwise 10 point review of systems negative per son  Past Medical History:  Diagnosis Date  . Anemia    takes Ferrous Sulfate daily  . Arthritis   . B12 deficiency    takes Vit B12 Q3 months  . Blood transfusion   . Colon polyps   . Diabetes mellitus    takes Metformin and Glipizide daily  . Hemorrhoids   . History of blood transfusion    no abnormal reaction noted  . Hyperlipidemia    takes Zetia daily  . Hypertension    takes Diovan and Metoprolol daily  . Joint pain   . Multiple thyroid nodules    takes Synthroid daily  . Osteoporosis    takes Fosamax every Wed  . Peripheral  vascular disease ([place])    DVT  2008 with ? anticoagulation  . PONV (postoperative nausea and vomiting)     Past Surgical History:  Procedure Laterality Date  . COLONOSCOPY WITH PROPOFOL N/A 12/06/2014   Procedure: COLONOSCOPY WITH PROPOFOL;  Surgeon: Garlan Fair, MD;  Location: [facility];  Service: Endoscopy;  Laterality: N/A;  . ESOPHAGOGASTRODUODENOSCOPY (EGD) WITH PROPOFOL N/A 12/06/2014   Procedure: ESOPHAGOGASTRODUODENOSCOPY (EGD) WITH PROPOFOL;  Surgeon: Garlan Fair, MD;  Location: [facility];  Service: Endoscopy;  Laterality: N/A;  . FEMUR SURGERY Left 07/08/11   plates and screws  . FRACTURE SURGERY     8/12 femur fx with rod and bolt placed  . HEMORRHOIDECTOMY WITH HEMORRHOID BANDING    . HERNIA REPAIR  1945  . MASTECTOMY W/ SENTINEL NODE BIOPSY Right 05/13/2014   Procedure: MASTECTOMY WITH SENTINEL LYMPH NODE BIOPSY;  Surgeon: Stark Klein, MD;  Location: [facility];  Service: General;  Laterality: Right;  . THYROIDECTOMY  12/04/2011   Procedure: THYROIDECTOMY;  Surgeon: Earnstine Regal, MD;  Location: [facility];  Service: General;  Laterality: N/A;  total thyroidectomy     reports that she has never smoked. She has never used smokeless tobacco. She reports that she does not drink alcohol or use drugs.  Allergies  Allergen Reactions  .  Ace Inhibitors Cough  . Codeine Nausea Only  . Evista [Raloxifene Hydrochloride] Other (See Comments)    cramps   . Zocor [Simvastatin - High Dose] Other (See Comments)    Cramps     History reviewed. No pertinent family history.  No premature coronary artery disease  Prior to Admission medications   Medication Sig Start Date End Date Taking? Authorizing Provider  Calcium Carb-Cholecalciferol (CALCIUM 600 + D) 600-200 MG-UNIT TABS Take 1 tablet by mouth every morning.    Yes [provider]  cyanocobalamin (,VITAMIN B-12,) 1000 MCG/ML injection Inject 1,000 mcg into the muscle every 3 (three) months.   Yes [provider]  ferrous sulfate 325 (65 FE) MG tablet Take 325 mg by mouth 2 (two) times daily with a meal.   Yes [provider]  GLIPIZIDE XL 5 MG 24 hr tablet Take 5 mg by mouth daily before breakfast.  09/28/11  Yes [provider]  levothyroxine (SYNTHROID, LEVOTHROID) 112 MCG tablet Take 112 mcg by mouth daily before breakfast.    Yes [provider]  losartan-hydrochlorothiazide (HYZAAR) 100-25 MG tablet Take 1 tablet by mouth daily. 05/19/18  Yes [provider]  metFORMIN (GLUCOPHAGE) 850 MG tablet Take 850 mg by mouth 3 (three) times daily.  09/04/11  Yes [provider]  metoprolol (TOPROL-XL) 100 MG 24 hr tablet Take 100 mg by mouth daily before breakfast.  09/28/11  Yes [provider]  Multiple Vitamin (MULTIVITAMIN WITH MINERALS) TABS tablet Take 1 tablet by mouth every morning.   Yes [provider]  ZETIA 10 MG tablet Take 10 mg by mouth daily before breakfast.  09/28/11  Yes [provider]  aspirin EC 81 MG tablet Take 1 tablet (81 mg total) by mouth daily. 06/10/18   Alisha Schmidt, MD  UNABLE TO FIND 174.9 Right Mastectomy   L8000- Post Surgical Bras-6  E5277- Silicone Breast Prosthesis-1 07/21/14   Stark Klein, MD    Physical Exam: Vitals:   06/10/18 1830 06/10/18 1930 06/10/18 1945 06/10/18 2015  BP: (!) 166/99 (!) 160/83 (!) 179/98 (!) 173/91  Pulse: 86 65 79 92  Resp: 15 19 17  (!) 21  Temp:      TempSrc:      SpO2: 99% 98% 96% 96%      Constitutional: NAD, calm, comfortable mild expressive aphasia Vitals:   06/10/18 1830 06/10/18 1930 06/10/18 1945 06/10/18 2015  BP: (!) 166/99 (!) 160/83 (!) 179/98 (!) 173/91  Pulse: 86 65 79 92  Resp: 15 19 17  (!) 21  Temp:      TempSrc:      SpO2: 99% 98% 96% 96%   Eyes: PERRL, lids and conjunctivae normal ENMT: Mucous membranes are moist. Posterior pharynx clear of any exudate or lesions.Normal dentition.  Neck: normal, supple, no masses, no  thyromegaly Respiratory: clear to auscultation bilaterally, no wheezing, no crackles. Normal respiratory effort. No accessory muscle use.  Cardiovascular: Regular rate and rhythm, no murmurs / rubs / gallops. No extremity edema. 2+ pedal pulses. No carotid bruits.  Abdomen: no tenderness, no masses palpated. No hepatosplenomegaly. Bowel sounds positive.  Musculoskeletal: no clubbing / cyanosis. No joint deformity upper and lower extremities. Good ROM, no contractures. Normal muscle tone.  Skin: no rashes, lesions, ulcers. No induration Neurologic: CN 2-12 grossly intact. Sensation intact, DTR normal. Strength 5/5 in all 4.  Psychiatric: Normal judgment and insight. Alert and oriented x 3. Normal mood.    Labs on Admission: I have personally  reviewed following labs and imaging studies  CBC: Recent Labs  Lab 06/10/18 0026 06/10/18 0033 06/10/18 1551 06/10/18 1658  WBC 8.6  --  8.9  --   NEUTROABS 6.2  --  7.1  --   HGB 11.2* 11.9* 11.4* 11.6*  HCT 35.6* 35.0* 36.3 34.0*  MCV 87.7  --  87.3  --   PLT 238  --  258  --    Basic Metabolic Panel: Recent Labs  Lab 06/10/18 0026 06/10/18 0033 06/10/18 1658  NA 134* 133* 131*  K 3.8 3.7 3.9  CL 94* 93* 90*  CO2 28  --   --   GLUCOSE 148* 144* 141*  BUN 18 20 16   CREATININE 1.50* 1.50* 1.40*  CALCIUM 9.2  --   --    GFR: Estimated Creatinine Clearance: 27.1 mL/min (A) (by C-G formula based on SCr of 1.4 mg/dL (H)). Liver Function Tests: Recent Labs  Lab 06/10/18 0026  AST 17  ALT 16  ALKPHOS 40  BILITOT 0.6  PROT 6.8  ALBUMIN 3.8   No results for input(s): LIPASE, AMYLASE in the last 168 hours. No results for input(s): AMMONIA in the last 168 hours. Coagulation Profile: Recent Labs  Lab 06/10/18 0026  INR 1.00   Cardiac Enzymes: No results for input(s): CKTOTAL, CKMB, CKMBINDEX, TROPONINI in the last 168 hours. BNP (last 3 results) No results for input(s): PROBNP in the last 8760 hours. HbA1C: No results for  input(s): HGBA1C in the last 72 hours. CBG: Recent Labs  Lab 06/10/18 0155 06/10/18 1329  GLUCAP 129* 131*   Lipid Profile: No results for input(s): CHOL, HDL, LDLCALC, TRIG, CHOLHDL, LDLDIRECT in the last 72 hours. Thyroid Function Tests: Recent Labs    06/10/18 1852  TSH 0.511   Anemia Panel: No results for input(s): VITAMINB12, FOLATE, FERRITIN, TIBC, IRON, RETICCTPCT in the last 72 hours. Urine analysis:    Component Value Date/Time   COLORURINE YELLOW 06/10/2018 1709   APPEARANCEUR CLEAR 06/10/2018 1709   LABSPEC 1.014 06/10/2018 1709   PHURINE 7.0 06/10/2018 1709   GLUCOSEU NEGATIVE 06/10/2018 1709   HGBUR NEGATIVE 06/10/2018 1709   BILIRUBINUR NEGATIVE 06/10/2018 1709   KETONESUR NEGATIVE 06/10/2018 1709   PROTEINUR NEGATIVE 06/10/2018 1709   UROBILINOGEN 0.2 07/08/2011 1426   NITRITE NEGATIVE 06/10/2018 1709   LEUKOCYTESUR NEGATIVE 06/10/2018 1709   Sepsis Labs: !!!!!!!!!!!!!!!!!!!!!!!!!!!!!!!!!!!!!!!!!!!! @LABRCNTIP (procalcitonin:4,lacticidven:4) )No results found for this or any previous visit (from the past 240 hour(s)).   Radiological Exams on Admission: Dg Chest 2 View  Result Date: 06/10/2018 CLINICAL DATA:  Altered mental status. EXAM: CHEST - 2 VIEW COMPARISON:  05/11/2014 FINDINGS: Slightly decreased lung volumes. Vascular crowding in the hilar regions. No focal airspace disease or pulmonary edema. Heart size is within normal limits. No large pleural effusions. IMPRESSION: Low lung volumes without focal chest disease. Electronically Signed   By: Markus Daft M.D.   On: 06/10/2018 21:14   Ct Head Wo Contrast  Result Date: 06/10/2018 CLINICAL DATA:  Altered LOC EXAM: CT HEAD WITHOUT CONTRAST TECHNIQUE: Contiguous axial images were obtained from the base of the skull through the vertex without intravenous contrast. COMPARISON:  None. FINDINGS: Brain: No acute territorial infarction, hemorrhage or intracranial mass. Moderate atrophy. Mild to moderate small  vessel ischemic changes of the white matter. Nonenlarged ventricles. Old appearing lacunar infarcts in the basal ganglia. Vascular: No hyperdense vessels. Vertebral artery and carotid vascular calcification Skull: Normal. Negative for fracture or focal lesion. Sinuses/Orbits: No acute finding. Other: None IMPRESSION:  1. No CT evidence for acute intracranial abnormality. 2. Atrophy and small vessel ischemic changes of the white matter. Electronically Signed   By: Donavan Foil M.D.   On: 06/10/2018 01:28   Mr Jodene Nam Head Wo Contrast  Result Date: 06/10/2018 CLINICAL DATA:  Initial evaluation for acute stroke.  Dysarthria. EXAM: MRI HEAD WITHOUT CONTRAST MRA HEAD WITHOUT CONTRAST TECHNIQUE: Multiplanar, multiecho pulse sequences of the brain and surrounding structures were obtained without intravenous contrast. Angiographic images of the head were obtained using MRA technique without contrast. COMPARISON:  Prior CT from earlier the same day. FINDINGS: MRI HEAD FINDINGS Brain: Examination moderately degraded by motion artifact. Generalized cerebral atrophy. Advanced chronic microvascular ischemic disease seen involving the supratentorial cerebral white matter. Prominent perivascular spaces present within the bilateral basal ganglia extending into the midbrain/pons. Few small remote lacunar infarct present at the right thalamus/right cerebral peduncle. No abnormal foci of restricted diffusion to suggest acute or subacute ischemia. Gray-white matter differentiation maintained. No evidence for acute intracranial hemorrhage. Few small chronic micro hemorrhages noted within the right thalamus. No mass lesion, midline shift or mass effect. No hydrocephalus. No extra-axial fluid collection. Pituitary gland grossly unremarkable. Vascular: Right vertebral artery likely hypoplastic and not well visualized. Major intracranial vascular flow voids otherwise maintained. Intracranial circulation somewhat dolichoectatic in  appearance. Skull and upper cervical spine: Craniocervical junction unremarkable. Bone marrow signal intensity within normal limits. No scalp soft tissue abnormality. Sinuses/Orbits: Globes and orbital soft tissues demonstrate no acute finding. Patient status post ocular lens replacement bilaterally. Paranasal sinuses are largely clear. Trace opacity right mastoid air cells, of doubtful significance. Other: None. MRA HEAD FINDINGS ANTERIOR CIRCULATION: Examination markedly degraded by motion artifact. Distal cervical segments of the internal carotid arteries are patent with antegrade flow. There is an apparent short-segment severe stenosis at the right cervical petrous junction (series 10, image 51). Petrous segments patent bilaterally. Cavernous and supraclinoid segments patent without appreciable stenosis. Origin of the ophthalmic arteries patent. ICA termini widely patent. A1 segments patent bilaterally. Left A1 appears to be partially duplicated. Grossly normal anterior communicating artery. Anterior cerebral arteries patent to their distal aspects without obvious stenosis. M1 segments patent without stenosis. Grossly normal MCA bifurcations. Probable short-segment mild to moderate proximal left M2 stenosis, superior division (series 1034, image 1). Distal MCA branches perfused and fairly symmetric POSTERIOR CIRCULATION: Dominant left vertebral artery patent to the vertebrobasilar junction without stenosis. Partially visualized left PICA patent. Hypoplastic right vertebral artery visualized within the distal neck, but not visualized at the level of the foramen magnum, and may be occluded. Minimal flow within the distal right V4 segment at the vertebrobasilar junction, which may in part be due to collateralization. Right PICA faintly visualized. Basilar patent to its distal aspect without stenosis. Superior cerebral arteries patent bilaterally. Right PCA supplied via the basilar. Left PCA supplied via the basilar  as well as a prominent posterior communicating artery. PCAs patent to their distal aspects without appreciable flow-limiting stenosis. Evaluation for aneurysm somewhat limited on this motion degraded exam. No obvious aneurysm identified. Irregular blush of contrast seen adjacent to the cavernous ICAs favored to be artifactual due to motion (series 10, image 98). IMPRESSION: MRI HEAD IMPRESSION: 1. Moderately motion degraded examination. 2. No acute intracranial infarct or other abnormality identified. 3. Age-related cerebral atrophy with moderate chronic small vessel ischemic disease. MRA HEAD IMPRESSION: 1. Moderately motion degraded examination. 2. Short-segment severe stenosis at the right cervical-petrous junction. Otherwise widely patent anterior circulation. 3. Nonvisualization of the hypoplastic right  vertebral artery at the skull base, which may be occluded. Dominant left vertebral artery widely patent as is the remainder of the vertebrobasilar circulation. Electronically Signed   By: Jeannine Boga M.D.   On: 06/10/2018 07:31   Mr Brain Wo Contrast  Result Date: 06/10/2018 CLINICAL DATA:  Initial evaluation for acute stroke.  Dysarthria. EXAM: MRI HEAD WITHOUT CONTRAST MRA HEAD WITHOUT CONTRAST TECHNIQUE: Multiplanar, multiecho pulse sequences of the brain and surrounding structures were obtained without intravenous contrast. Angiographic images of the head were obtained using MRA technique without contrast. COMPARISON:  Prior CT from earlier the same day. FINDINGS: MRI HEAD FINDINGS Brain: Examination moderately degraded by motion artifact. Generalized cerebral atrophy. Advanced chronic microvascular ischemic disease seen involving the supratentorial cerebral white matter. Prominent perivascular spaces present within the bilateral basal ganglia extending into the midbrain/pons. Few small remote lacunar infarct present at the right thalamus/right cerebral peduncle. No abnormal foci of restricted  diffusion to suggest acute or subacute ischemia. Gray-white matter differentiation maintained. No evidence for acute intracranial hemorrhage. Few small chronic micro hemorrhages noted within the right thalamus. No mass lesion, midline shift or mass effect. No hydrocephalus. No extra-axial fluid collection. Pituitary gland grossly unremarkable. Vascular: Right vertebral artery likely hypoplastic and not well visualized. Major intracranial vascular flow voids otherwise maintained. Intracranial circulation somewhat dolichoectatic in appearance. Skull and upper cervical spine: Craniocervical junction unremarkable. Bone marrow signal intensity within normal limits. No scalp soft tissue abnormality. Sinuses/Orbits: Globes and orbital soft tissues demonstrate no acute finding. Patient status post ocular lens replacement bilaterally. Paranasal sinuses are largely clear. Trace opacity right mastoid air cells, of doubtful significance. Other: None. MRA HEAD FINDINGS ANTERIOR CIRCULATION: Examination markedly degraded by motion artifact. Distal cervical segments of the internal carotid arteries are patent with antegrade flow. There is an apparent short-segment severe stenosis at the right cervical petrous junction (series 10, image 51). Petrous segments patent bilaterally. Cavernous and supraclinoid segments patent without appreciable stenosis. Origin of the ophthalmic arteries patent. ICA termini widely patent. A1 segments patent bilaterally. Left A1 appears to be partially duplicated. Grossly normal anterior communicating artery. Anterior cerebral arteries patent to their distal aspects without obvious stenosis. M1 segments patent without stenosis. Grossly normal MCA bifurcations. Probable short-segment mild to moderate proximal left M2 stenosis, superior division (series 1034, image 1). Distal MCA branches perfused and fairly symmetric POSTERIOR CIRCULATION: Dominant left vertebral artery patent to the vertebrobasilar  junction without stenosis. Partially visualized left PICA patent. Hypoplastic right vertebral artery visualized within the distal neck, but not visualized at the level of the foramen magnum, and may be occluded. Minimal flow within the distal right V4 segment at the vertebrobasilar junction, which may in part be due to collateralization. Right PICA faintly visualized. Basilar patent to its distal aspect without stenosis. Superior cerebral arteries patent bilaterally. Right PCA supplied via the basilar. Left PCA supplied via the basilar as well as a prominent posterior communicating artery. PCAs patent to their distal aspects without appreciable flow-limiting stenosis. Evaluation for aneurysm somewhat limited on this motion degraded exam. No obvious aneurysm identified. Irregular blush of contrast seen adjacent to the cavernous ICAs favored to be artifactual due to motion (series 10, image 98). IMPRESSION: MRI HEAD IMPRESSION: 1. Moderately motion degraded examination. 2. No acute intracranial infarct or other abnormality identified. 3. Age-related cerebral atrophy with moderate chronic small vessel ischemic disease. MRA HEAD IMPRESSION: 1. Moderately motion degraded examination. 2. Short-segment severe stenosis at the right cervical-petrous junction. Otherwise widely patent anterior circulation. 3. Nonvisualization  of the hypoplastic right vertebral artery at the skull base, which may be occluded. Dominant left vertebral artery widely patent as is the remainder of the vertebrobasilar circulation. Electronically Signed   By: Jeannine Boga M.D.   On: 06/10/2018 07:31    EKG: Independently reviewed.  Sinus rhythm occasional PVC no acute changes  Chest x-ray reviewed no edema or infiltrate  Old chart reviewed  Case discussed with Dr. Tyrone Nine in the ED  Assessment/Plan 84 year old female with encephalopathy and mild expressive aphasia of unclear etiology Principal Problem:   Acute metabolic  encephalopathy-source of infection.  Labs are unremarkable.  Patient not volume overloaded.  She is hypertensive query if this could be due to hypertensive encephalopathy.  No focal neurological deficits.  Will place on hydralazine IV now.  Will obtain ammonia level and LFTs.  Patient had MRI yesterday which was normal.  Obtain frequent neurological checks.  Placed on aspirin.  If no change by the morning obtain neurology consult.  Doubt due to dehydration as her creatinine is at her baseline her BUn normal.  Her sodium is 131 which is likely not the cause also of her encephalopathy.   Active Problems:   Hypertension-.  Hydralazine      DVT prophylaxis: SCDs Code Status: Full Family Communication: Son and grandchildren Disposition Plan: 1 to 2 days Consults called: None Admission status: Observation   Rogen Porte A MD [facility]  If 7PM-7AM, please contact night-coverage [email or web address] Password [facility]  06/10/2018, 9:34 PM

## 2018-06-10 NOTE — ED Triage Notes (Signed)
Per EMS:  Patient presents from home after being seen last night for altered mental status.  Family notes today urine is malodorous and she is urinating more frequently.  No urinalysis last night.

## 2018-06-10 NOTE — ED Notes (Signed)
Patient transported to X-ray 

## 2018-06-10 NOTE — ED Provider Notes (Signed)
Patient placed in Quick Look pathway, seen and evaluated   Chief Complaint: confusion  HPI:   Family reports they think pt has a uti  ROS: no fever, no cough  Physical Exam:   Gen: No distress  Neuro: Awake and Alert  Skin: Warm    Focused Exam: Lungs clear, Heart RRR   Initiation of care has begun. The patient has been counseled on the process, plan, and necessity for staying for the completion/evaluation, and the remainder of the medical screening examination   Sidney Ace 06/10/18 1328    Julianne Rice, MD 06/12/18 1444

## 2018-06-10 NOTE — ED Provider Notes (Signed)
[facility] EMERGENCY DEPARTMENT Provider Note   CSN: [id number] Arrival date & time: 06/09/18  2339     History   Chief Complaint Chief Complaint  Patient presents with  . Altered Mental Status    HPI ANISA LEANOS is a 84 y.o. female.  HPI 84 year old female presents the emergency department with slight abnormal speech for the past 24 hours.  No fevers or chills.  No neck stiffness.  No swelling of her tongue.  No weakness of her arms or legs.  No difficulty with her gait.  No significant confusion per the patient of the patient's family member.  At times she has more difficulty with stating certain facts like her birthdate.   Past Medical History:  Diagnosis Date  . Anemia    takes Ferrous Sulfate daily  . Arthritis   . B12 deficiency    takes Vit B12 Q3 months  . Blood transfusion   . Colon polyps   . Diabetes mellitus    takes Metformin and Glipizide daily  . Hemorrhoids   . History of blood transfusion    no abnormal reaction noted  . Hyperlipidemia    takes Zetia daily  . Hypertension    takes Diovan and Metoprolol daily  . Joint pain   . Multiple thyroid nodules    takes Synthroid daily  . Osteoporosis    takes Fosamax every Wed  . Peripheral vascular disease ([place])    DVT  2008 with ? anticoagulation  . PONV (postoperative nausea and vomiting)     Patient Active Problem List   Diagnosis Date Noted  . Breast cancer ([place]) 05/12/2014  . Breast cancer of upper-outer quadrant of right female breast ([place]) 04/26/2014  . Hypothyroidism, postsurgical 02/04/2012  . Multinodular goiter (nontoxic), atypia on cytopathology 10/18/2011    Past Surgical History:  Procedure Laterality Date  . COLONOSCOPY WITH PROPOFOL N/A 12/06/2014   Procedure: COLONOSCOPY WITH PROPOFOL;  Surgeon: Garlan Fair, MD;  Location: [facility];  Service: Endoscopy;  Laterality: N/A;  . ESOPHAGOGASTRODUODENOSCOPY (EGD) WITH PROPOFOL N/A 12/06/2014   Procedure:  ESOPHAGOGASTRODUODENOSCOPY (EGD) WITH PROPOFOL;  Surgeon: Garlan Fair, MD;  Location: [facility];  Service: Endoscopy;  Laterality: N/A;  . FEMUR SURGERY Left 07/08/11   plates and screws  . FRACTURE SURGERY     8/12 femur fx with rod and bolt placed  . HEMORRHOIDECTOMY WITH HEMORRHOID BANDING    . HERNIA REPAIR  1945  . MASTECTOMY W/ SENTINEL NODE BIOPSY Right 05/13/2014   Procedure: MASTECTOMY WITH SENTINEL LYMPH NODE BIOPSY;  Surgeon: Stark Klein, MD;  Location: [facility];  Service: General;  Laterality: Right;  . THYROIDECTOMY  12/04/2011   Procedure: THYROIDECTOMY;  Surgeon: Earnstine Regal, MD;  Location: [facility];  Service: General;  Laterality: N/A;  total thyroidectomy     OB History   None      Home Medications    Prior to Admission medications   Medication Sig Start Date End Date Taking? Authorizing Provider  alendronate (FOSAMAX) 70 MG tablet Take 70 mg by mouth once a week. Wednesday    [provider]  aspirin EC 81 MG tablet Take 1 tablet (81 mg total) by mouth daily. 06/10/18   Jola Schmidt, MD  Calcium Carb-Cholecalciferol (CALCIUM 600 + D) 600-200 MG-UNIT TABS Take 1 tablet by mouth every morning.     [provider]  cyanocobalamin (,VITAMIN B-12,) 1000 MCG/ML injection Inject 1,000 mcg into the muscle every 3 (three) months.  [provider]  GLIPIZIDE XL 5 MG 24 hr tablet Take 5 mg by mouth daily before breakfast.  09/28/11   [provider]  iron polysaccharides (NU-IRON) 150 MG capsule Take 150 mg by mouth every morning.    [provider]  levothyroxine (SYNTHROID, LEVOTHROID) 100 MCG tablet Take 100 mcg by mouth daily before breakfast.    [provider]  metFORMIN (GLUCOPHAGE) 850 MG tablet Take 850 mg by mouth 3 (three) times daily.  09/04/11   [provider]  metoprolol (TOPROL-XL) 100 MG 24 hr tablet Take 100 mg by mouth daily before breakfast.  09/28/11   [provider]  Multiple  Vitamin (MULTIVITAMIN WITH MINERALS) TABS tablet Take 1 tablet by mouth every morning.    [provider]  UNABLE TO FIND 174.9 Right Mastectomy   L8000- Post Surgical Bras-6  W2585- Silicone Breast Prosthesis-1 07/21/14   Stark Klein, MD  valsartan-hydrochlorothiazide (DIOVAN-HCT) 160-25 MG per tablet Take 1 tablet by mouth daily before breakfast.  09/28/11   [provider]  ZETIA 10 MG tablet Take 10 mg by mouth daily before breakfast.  09/28/11   [provider]    Family History No family history on file.  Social History Social History   Tobacco Use  . Smoking status: Never Smoker  . Smokeless tobacco: Never Used  Substance Use Topics  . Alcohol use: No  . Drug use: No     Allergies   Ace inhibitors; Codeine; Evista [raloxifene hydrochloride]; and Zocor [simvastatin - high dose]   Review of Systems Review of Systems  All other systems reviewed and are negative.    Physical Exam Updated Vital Signs BP (!) 168/110   Pulse 96   Temp 98.3 F (36.8 C)   Resp 13   Ht 5\' 2"  (1.575 m)   Wt 68 kg (150 lb)   SpO2 99%   BMI 27.44 kg/m   Physical Exam  Constitutional: She is oriented to person, place, and time. She appears well-developed and well-nourished. No distress.  HENT:  Head: Normocephalic and atraumatic.  Eyes: Pupils are equal, round, and reactive to light. EOM are normal.  Neck: Normal range of motion.  Cardiovascular: Normal rate, regular rhythm and normal heart sounds.  Pulmonary/Chest: Effort normal and breath sounds normal.  Abdominal: Soft. She exhibits no distension. There is no tenderness.  Musculoskeletal: Normal range of motion.  Neurological: She is alert and oriented to person, place, and time.  5/5 strength in major muscle groups of  bilateral upper and lower extremities.  Occasional stumbling on certain words but then was easily corrected. no facial asymetry.  Finger-to-nose normal bilaterally  Skin: Skin is warm  and dry.  Psychiatric: She has a normal mood and affect. Judgment normal.  Nursing note and vitals reviewed.    ED Treatments / Results  Labs (all labs ordered are listed, but only abnormal results are displayed) Labs Reviewed  CBC - Abnormal; Notable for the following components:      Result Value   Hemoglobin 11.2 (*)    HCT 35.6 (*)    All other components within normal limits  COMPREHENSIVE METABOLIC PANEL - Abnormal; Notable for the following components:   Sodium 134 (*)    Chloride 94 (*)    Glucose, Bld 148 (*)    Creatinine, Ser 1.50 (*)    GFR calc non Af Amer 31 (*)    GFR calc Af Amer 36 (*)    All other components within  normal limits  CBG MONITORING, ED - Abnormal; Notable for the following components:   Glucose-Capillary 129 (*)    All other components within normal limits  I-STAT CHEM 8, ED - Abnormal; Notable for the following components:   Sodium 133 (*)    Chloride 93 (*)    Creatinine, Ser 1.50 (*)    Glucose, Bld 144 (*)    Calcium, Ion 1.10 (*)    Hemoglobin 11.9 (*)    HCT 35.0 (*)    All other components within normal limits  PROTIME-INR  APTT  DIFFERENTIAL  I-STAT TROPONIN, ED    EKG EKG Interpretation  Date/Time:  Tuesday June 10 2018 00:51:31 EDT Ventricular Rate:  82 PR Interval:  150 QRS Duration: 90 QT Interval:  380 QTC Calculation: 443 R Axis:   12 Text Interpretation:  Sinus rhythm with occasional Premature ventricular complexes Otherwise normal ECG No significant change was found Confirmed by Jola Schmidt [phone]) on 06/10/2018 2:10:06 AM   Radiology Ct Head Wo Contrast  Result Date: 06/10/2018 CLINICAL DATA:  Altered LOC EXAM: CT HEAD WITHOUT CONTRAST TECHNIQUE: Contiguous axial images were obtained from the base of the skull through the vertex without intravenous contrast. COMPARISON:  None. FINDINGS: Brain: No acute territorial infarction, hemorrhage or intracranial mass. Moderate atrophy. Mild to moderate small vessel  ischemic changes of the white matter. Nonenlarged ventricles. Old appearing lacunar infarcts in the basal ganglia. Vascular: No hyperdense vessels. Vertebral artery and carotid vascular calcification Skull: Normal. Negative for fracture or focal lesion. Sinuses/Orbits: No acute finding. Other: None IMPRESSION: 1. No CT evidence for acute intracranial abnormality. 2. Atrophy and small vessel ischemic changes of the white matter. Electronically Signed   By: Donavan Foil M.D.   On: 06/10/2018 01:28   Mr Jodene Nam Head Wo Contrast  Result Date: 06/10/2018 CLINICAL DATA:  Initial evaluation for acute stroke.  Dysarthria. EXAM: MRI HEAD WITHOUT CONTRAST MRA HEAD WITHOUT CONTRAST TECHNIQUE: Multiplanar, multiecho pulse sequences of the brain and surrounding structures were obtained without intravenous contrast. Angiographic images of the head were obtained using MRA technique without contrast. COMPARISON:  Prior CT from earlier the same day. FINDINGS: MRI HEAD FINDINGS Brain: Examination moderately degraded by motion artifact. Generalized cerebral atrophy. Advanced chronic microvascular ischemic disease seen involving the supratentorial cerebral white matter. Prominent perivascular spaces present within the bilateral basal ganglia extending into the midbrain/pons. Few small remote lacunar infarct present at the right thalamus/right cerebral peduncle. No abnormal foci of restricted diffusion to suggest acute or subacute ischemia. Gray-white matter differentiation maintained. No evidence for acute intracranial hemorrhage. Few small chronic micro hemorrhages noted within the right thalamus. No mass lesion, midline shift or mass effect. No hydrocephalus. No extra-axial fluid collection. Pituitary gland grossly unremarkable. Vascular: Right vertebral artery likely hypoplastic and not well visualized. Major intracranial vascular flow voids otherwise maintained. Intracranial circulation somewhat dolichoectatic in appearance. Skull  and upper cervical spine: Craniocervical junction unremarkable. Bone marrow signal intensity within normal limits. No scalp soft tissue abnormality. Sinuses/Orbits: Globes and orbital soft tissues demonstrate no acute finding. Patient status post ocular lens replacement bilaterally. Paranasal sinuses are largely clear. Trace opacity right mastoid air cells, of doubtful significance. Other: None. MRA HEAD FINDINGS ANTERIOR CIRCULATION: Examination markedly degraded by motion artifact. Distal cervical segments of the internal carotid arteries are patent with antegrade flow. There is an apparent short-segment severe stenosis at the right cervical petrous junction (series 10, image 51). Petrous segments patent bilaterally. Cavernous and supraclinoid segments patent without appreciable stenosis. Origin  of the ophthalmic arteries patent. ICA termini widely patent. A1 segments patent bilaterally. Left A1 appears to be partially duplicated. Grossly normal anterior communicating artery. Anterior cerebral arteries patent to their distal aspects without obvious stenosis. M1 segments patent without stenosis. Grossly normal MCA bifurcations. Probable short-segment mild to moderate proximal left M2 stenosis, superior division (series 1034, image 1). Distal MCA branches perfused and fairly symmetric POSTERIOR CIRCULATION: Dominant left vertebral artery patent to the vertebrobasilar junction without stenosis. Partially visualized left PICA patent. Hypoplastic right vertebral artery visualized within the distal neck, but not visualized at the level of the foramen magnum, and may be occluded. Minimal flow within the distal right V4 segment at the vertebrobasilar junction, which may in part be due to collateralization. Right PICA faintly visualized. Basilar patent to its distal aspect without stenosis. Superior cerebral arteries patent bilaterally. Right PCA supplied via the basilar. Left PCA supplied via the basilar as well as a  prominent posterior communicating artery. PCAs patent to their distal aspects without appreciable flow-limiting stenosis. Evaluation for aneurysm somewhat limited on this motion degraded exam. No obvious aneurysm identified. Irregular blush of contrast seen adjacent to the cavernous ICAs favored to be artifactual due to motion (series 10, image 98). IMPRESSION: MRI HEAD IMPRESSION: 1. Moderately motion degraded examination. 2. No acute intracranial infarct or other abnormality identified. 3. Age-related cerebral atrophy with moderate chronic small vessel ischemic disease. MRA HEAD IMPRESSION: 1. Moderately motion degraded examination. 2. Short-segment severe stenosis at the right cervical-petrous junction. Otherwise widely patent anterior circulation. 3. Nonvisualization of the hypoplastic right vertebral artery at the skull base, which may be occluded. Dominant left vertebral artery widely patent as is the remainder of the vertebrobasilar circulation. Electronically Signed   By: Jeannine Boga M.D.   On: 06/10/2018 07:31   Mr Brain Wo Contrast  Result Date: 06/10/2018 CLINICAL DATA:  Initial evaluation for acute stroke.  Dysarthria. EXAM: MRI HEAD WITHOUT CONTRAST MRA HEAD WITHOUT CONTRAST TECHNIQUE: Multiplanar, multiecho pulse sequences of the brain and surrounding structures were obtained without intravenous contrast. Angiographic images of the head were obtained using MRA technique without contrast. COMPARISON:  Prior CT from earlier the same day. FINDINGS: MRI HEAD FINDINGS Brain: Examination moderately degraded by motion artifact. Generalized cerebral atrophy. Advanced chronic microvascular ischemic disease seen involving the supratentorial cerebral white matter. Prominent perivascular spaces present within the bilateral basal ganglia extending into the midbrain/pons. Few small remote lacunar infarct present at the right thalamus/right cerebral peduncle. No abnormal foci of restricted diffusion to  suggest acute or subacute ischemia. Gray-white matter differentiation maintained. No evidence for acute intracranial hemorrhage. Few small chronic micro hemorrhages noted within the right thalamus. No mass lesion, midline shift or mass effect. No hydrocephalus. No extra-axial fluid collection. Pituitary gland grossly unremarkable. Vascular: Right vertebral artery likely hypoplastic and not well visualized. Major intracranial vascular flow voids otherwise maintained. Intracranial circulation somewhat dolichoectatic in appearance. Skull and upper cervical spine: Craniocervical junction unremarkable. Bone marrow signal intensity within normal limits. No scalp soft tissue abnormality. Sinuses/Orbits: Globes and orbital soft tissues demonstrate no acute finding. Patient status post ocular lens replacement bilaterally. Paranasal sinuses are largely clear. Trace opacity right mastoid air cells, of doubtful significance. Other: None. MRA HEAD FINDINGS ANTERIOR CIRCULATION: Examination markedly degraded by motion artifact. Distal cervical segments of the internal carotid arteries are patent with antegrade flow. There is an apparent short-segment severe stenosis at the right cervical petrous junction (series 10, image 51). Petrous segments patent bilaterally. Cavernous and supraclinoid segments patent  without appreciable stenosis. Origin of the ophthalmic arteries patent. ICA termini widely patent. A1 segments patent bilaterally. Left A1 appears to be partially duplicated. Grossly normal anterior communicating artery. Anterior cerebral arteries patent to their distal aspects without obvious stenosis. M1 segments patent without stenosis. Grossly normal MCA bifurcations. Probable short-segment mild to moderate proximal left M2 stenosis, superior division (series 1034, image 1). Distal MCA branches perfused and fairly symmetric POSTERIOR CIRCULATION: Dominant left vertebral artery patent to the vertebrobasilar junction without  stenosis. Partially visualized left PICA patent. Hypoplastic right vertebral artery visualized within the distal neck, but not visualized at the level of the foramen magnum, and may be occluded. Minimal flow within the distal right V4 segment at the vertebrobasilar junction, which may in part be due to collateralization. Right PICA faintly visualized. Basilar patent to its distal aspect without stenosis. Superior cerebral arteries patent bilaterally. Right PCA supplied via the basilar. Left PCA supplied via the basilar as well as a prominent posterior communicating artery. PCAs patent to their distal aspects without appreciable flow-limiting stenosis. Evaluation for aneurysm somewhat limited on this motion degraded exam. No obvious aneurysm identified. Irregular blush of contrast seen adjacent to the cavernous ICAs favored to be artifactual due to motion (series 10, image 98). IMPRESSION: MRI HEAD IMPRESSION: 1. Moderately motion degraded examination. 2. No acute intracranial infarct or other abnormality identified. 3. Age-related cerebral atrophy with moderate chronic small vessel ischemic disease. MRA HEAD IMPRESSION: 1. Moderately motion degraded examination. 2. Short-segment severe stenosis at the right cervical-petrous junction. Otherwise widely patent anterior circulation. 3. Nonvisualization of the hypoplastic right vertebral artery at the skull base, which may be occluded. Dominant left vertebral artery widely patent as is the remainder of the vertebrobasilar circulation. Electronically Signed   By: Jeannine Boga M.D.   On: 06/10/2018 07:31    Procedures Procedures (including critical care time)  Medications Ordered in ED Medications  LORazepam (ATIVAN) injection 0.5 mg (0.5 mg Intravenous Given 06/10/18 0530)  aspirin EC tablet 81 mg (has no administration in time range)     Initial Impression / Assessment and Plan / ED Course  I have reviewed the triage vital signs and the nursing  notes.  Pertinent labs & imaging results that were available during my care of the patient were reviewed by me and considered in my medical decision making (see chart for details).     No obvious abnormality noted on MRI MRA.  There is some motion degradation to the study itself and there could be a very small stroke which is present.  She will be placed on 81 mg aspirin and followed up with neurology.  The majority the MRI is without obvious abnormality to explain her symptomatology today.  Primary care follow-up.  Outpatient neurology follow-up.  Long discussion regarding the results had with the patient and the patient's son.  At this point I think she is stable for discharge from the emergency department.  It is understood that she will need additional outpatient work-up but nothing at this time needs to be done acutely.  Final Clinical Impressions(s) / ED Diagnoses   Final diagnoses:  Speech disturbance, unspecified type    ED Discharge Orders        Ordered    aspirin EC 81 MG tablet  Daily     06/10/18 0758       Jola Schmidt, MD 06/10/18 [phone]

## 2018-06-10 NOTE — ED Notes (Signed)
Admitting at bedside 

## 2018-06-10 NOTE — ED Notes (Signed)
Pt attempted to use the restroom and became tearful and frustrated and stated "I can't do it I know it's there but I just can't. It burns and there is just pressure. Informed Pt that it's ok if she can't go we will get it once she gets back to a room.

## 2018-06-10 NOTE — ED Notes (Signed)
Pt has attempted to provide UA at this time. The first time the pt attempted was unsuccessful, and the second time the pt attempted to provide a UA she had a BM instead. Pt continues to be altered (according to family) and complaining of dizziness.

## 2018-06-10 NOTE — ED Notes (Signed)
Patient ambulatory to bathroom with 1 assist. steady gait at this time

## 2018-06-10 NOTE — ED Provider Notes (Signed)
[facility] EMERGENCY DEPARTMENT Provider Note   CSN: [id number] Arrival date & time: 06/10/18  1316     History   Chief Complaint Chief Complaint  Patient presents with  . Altered Mental Status  . Urinary Tract Infection    HPI Alisha [name] is a 84 y.o. female.  84 yo F with a chief complaint of dysuria increased frequency and hesitancy.  This is been going on since yesterday.  The patient had been significantly confused over the past 2 or 3 days.  Was seen in the ED yesterday and had a very thorough work-up.  Since then the patient has not improved.  She started complaining of urinary complaints.  Denies flank pains or fevers.  Denies coughing shortness of breath nausea or vomiting.  The history is provided by the patient.  Illness  This is a new problem. The current episode started 2 days ago. The problem occurs constantly. The problem has not changed since onset.Pertinent negatives include no chest pain, no headaches and no shortness of breath. Nothing aggravates the symptoms. Nothing relieves the symptoms. She has tried nothing for the symptoms. The treatment provided no relief.    Past Medical History:  Diagnosis Date  . Anemia    takes Ferrous Sulfate daily  . Arthritis   . B12 deficiency    takes Vit B12 Q3 months  . Blood transfusion   . Colon polyps   . Diabetes mellitus    takes Metformin and Glipizide daily  . Hemorrhoids   . History of blood transfusion    no abnormal reaction noted  . Hyperlipidemia    takes Zetia daily  . Hypertension    takes Diovan and Metoprolol daily  . Joint pain   . Multiple thyroid nodules    takes Synthroid daily  . Osteoporosis    takes Fosamax every Wed  . Peripheral vascular disease ([place])    DVT  2008 with ? anticoagulation  . PONV (postoperative nausea and vomiting)     Patient Active Problem List   Diagnosis Date Noted  . Breast cancer ([place]) 05/12/2014  . Breast cancer of upper-outer quadrant of  right female breast ([place]) 04/26/2014  . Hypothyroidism, postsurgical 02/04/2012  . Multinodular goiter (nontoxic), atypia on cytopathology 10/18/2011    Past Surgical History:  Procedure Laterality Date  . COLONOSCOPY WITH PROPOFOL N/A 12/06/2014   Procedure: COLONOSCOPY WITH PROPOFOL;  Surgeon: Garlan Fair, MD;  Location: [facility];  Service: Endoscopy;  Laterality: N/A;  . ESOPHAGOGASTRODUODENOSCOPY (EGD) WITH PROPOFOL N/A 12/06/2014   Procedure: ESOPHAGOGASTRODUODENOSCOPY (EGD) WITH PROPOFOL;  Surgeon: Garlan Fair, MD;  Location: [facility];  Service: Endoscopy;  Laterality: N/A;  . FEMUR SURGERY Left 07/08/11   plates and screws  . FRACTURE SURGERY     8/12 femur fx with rod and bolt placed  . HEMORRHOIDECTOMY WITH HEMORRHOID BANDING    . HERNIA REPAIR  1945  . MASTECTOMY W/ SENTINEL NODE BIOPSY Right 05/13/2014   Procedure: MASTECTOMY WITH SENTINEL LYMPH NODE BIOPSY;  Surgeon: Stark Klein, MD;  Location: [facility];  Service: General;  Laterality: Right;  . THYROIDECTOMY  12/04/2011   Procedure: THYROIDECTOMY;  Surgeon: Earnstine Regal, MD;  Location: [facility];  Service: General;  Laterality: N/A;  total thyroidectomy     OB History   None      Home Medications    Prior to Admission medications   Medication Sig Start Date End Date Taking? Authorizing Provider  Calcium Carb-Cholecalciferol (CALCIUM  600 + D) 600-200 MG-UNIT TABS Take 1 tablet by mouth every morning.    Yes [provider]  cyanocobalamin (,VITAMIN B-12,) 1000 MCG/ML injection Inject 1,000 mcg into the muscle every 3 (three) months.   Yes [provider]  ferrous sulfate 325 (65 FE) MG tablet Take 325 mg by mouth 2 (two) times daily with a meal.   Yes [provider]  GLIPIZIDE XL 5 MG 24 hr tablet Take 5 mg by mouth daily before breakfast.  09/28/11  Yes [provider]  levothyroxine (SYNTHROID, LEVOTHROID) 112 MCG tablet Take 112 mcg by mouth daily before breakfast.     Yes [provider]  losartan-hydrochlorothiazide (HYZAAR) 100-25 MG tablet Take 1 tablet by mouth daily. 05/19/18  Yes [provider]  metFORMIN (GLUCOPHAGE) 850 MG tablet Take 850 mg by mouth 3 (three) times daily.  09/04/11  Yes [provider]  metoprolol (TOPROL-XL) 100 MG 24 hr tablet Take 100 mg by mouth daily before breakfast.  09/28/11  Yes [provider]  Multiple Vitamin (MULTIVITAMIN WITH MINERALS) TABS tablet Take 1 tablet by mouth every morning.   Yes [provider]  ZETIA 10 MG tablet Take 10 mg by mouth daily before breakfast.  09/28/11  Yes [provider]  aspirin EC 81 MG tablet Take 1 tablet (81 mg total) by mouth daily. 06/10/18   Jola Schmidt, MD  UNABLE TO FIND 174.9 Right Mastectomy   L8000- Post Surgical Bras-6  Z8588- Silicone Breast Prosthesis-1 07/21/14   Stark Klein, MD    Family History History reviewed. No pertinent family history.  Social History Social History   Tobacco Use  . Smoking status: Never Smoker  . Smokeless tobacco: Never Used  Substance Use Topics  . Alcohol use: No  . Drug use: No     Allergies   Ace inhibitors; Codeine; Evista [raloxifene hydrochloride]; and Zocor [simvastatin - high dose]   Review of Systems Review of Systems  Constitutional: Negative for chills and fever.  HENT: Negative for congestion and rhinorrhea.   Eyes: Negative for redness and visual disturbance.  Respiratory: Negative for shortness of breath and wheezing.   Cardiovascular: Negative for chest pain and palpitations.  Gastrointestinal: Negative for nausea and vomiting.  Genitourinary: Positive for dysuria, frequency and urgency.  Musculoskeletal: Negative for arthralgias and myalgias.  Skin: Negative for pallor and wound.  Neurological: Negative for dizziness and headaches.  Psychiatric/Behavioral: Positive for confusion.     Physical Exam Updated Vital Signs BP (!) 179/98   Pulse 79   Temp  98.9 F (37.2 C) (Oral)   Resp 17   SpO2 96%   Physical Exam  Constitutional: She appears well-developed and well-nourished. No distress.  HENT:  Head: Normocephalic and atraumatic.  Eyes: Pupils are equal, round, and reactive to light. EOM are normal.  Neck: Normal range of motion. Neck supple.  Cardiovascular: Normal rate and regular rhythm. Exam reveals no gallop and no friction rub.  No murmur heard. Pulmonary/Chest: Effort normal. She has no wheezes. She has no rales.  Abdominal: Soft. She exhibits no distension. There is no tenderness.  Musculoskeletal: She exhibits no edema or tenderness.  Neurological: She is alert.  Skin: Skin is warm and dry. She is not diaphoretic.  Psychiatric: She has a normal mood and affect. Her behavior is normal.  Nursing note and vitals reviewed.    ED Treatments / Results  Labs (all labs ordered are listed, but only abnormal results are displayed) Labs Reviewed  CBC WITH DIFFERENTIAL/PLATELET - Abnormal; Notable for the following components:      Result Value   Hemoglobin 11.4 (*)    All other components within normal limits  CBG MONITORING, ED - Abnormal; Notable for the following components:   Glucose-Capillary 131 (*)    All other components within normal limits  I-STAT CHEM 8, ED - Abnormal; Notable for the following components:   Sodium 131 (*)    Chloride 90 (*)    Creatinine, Ser 1.40 (*)    Glucose, Bld 141 (*)    Calcium, Ion 1.10 (*)    Hemoglobin 11.6 (*)    HCT 34.0 (*)    All other components within normal limits  URINALYSIS, ROUTINE W REFLEX MICROSCOPIC  TSH  T4  I-STAT TROPONIN, ED    EKG None  Radiology Ct Head Wo Contrast  Result Date: 06/10/2018 CLINICAL DATA:  Altered LOC EXAM: CT HEAD WITHOUT CONTRAST TECHNIQUE: Contiguous axial images were obtained from the base of the skull through the vertex without intravenous contrast. COMPARISON:  None. FINDINGS: Brain: No acute territorial infarction, hemorrhage or  intracranial mass. Moderate atrophy. Mild to moderate small vessel ischemic changes of the white matter. Nonenlarged ventricles. Old appearing lacunar infarcts in the basal ganglia. Vascular: No hyperdense vessels. Vertebral artery and carotid vascular calcification Skull: Normal. Negative for fracture or focal lesion. Sinuses/Orbits: No acute finding. Other: None IMPRESSION: 1. No CT evidence for acute intracranial abnormality. 2. Atrophy and small vessel ischemic changes of the white matter. Electronically Signed   By: Donavan Foil M.D.   On: 06/10/2018 01:28   Mr Jodene Nam Head Wo Contrast  Result Date: 06/10/2018 CLINICAL DATA:  Initial evaluation for acute stroke.  Dysarthria. EXAM: MRI HEAD WITHOUT CONTRAST MRA HEAD WITHOUT CONTRAST TECHNIQUE: Multiplanar, multiecho pulse sequences of the brain and surrounding structures were obtained without intravenous contrast. Angiographic images of the head were obtained using MRA technique without contrast. COMPARISON:  Prior CT from earlier the same day. FINDINGS: MRI HEAD FINDINGS Brain: Examination moderately degraded by motion artifact. Generalized cerebral atrophy. Advanced chronic microvascular ischemic disease seen involving the supratentorial cerebral white matter. Prominent perivascular spaces present within the bilateral basal ganglia extending into the midbrain/pons. Few small remote lacunar infarct present at the right thalamus/right cerebral peduncle. No abnormal foci of restricted diffusion to suggest acute or subacute ischemia. Gray-white matter differentiation maintained. No evidence for acute intracranial hemorrhage. Few small chronic micro hemorrhages noted within the right thalamus. No mass lesion, midline shift or mass effect. No hydrocephalus. No extra-axial fluid collection. Pituitary gland grossly unremarkable. Vascular: Right vertebral artery likely hypoplastic and not well visualized. Major intracranial vascular flow voids otherwise maintained.  Intracranial circulation somewhat dolichoectatic in appearance. Skull and upper cervical spine: Craniocervical junction unremarkable. Bone marrow signal intensity within normal limits. No scalp soft tissue abnormality. Sinuses/Orbits: Globes and orbital soft tissues demonstrate no acute finding. Patient status post ocular lens replacement bilaterally. Paranasal sinuses are largely clear. Trace opacity right mastoid air cells, of doubtful significance. Other: None. MRA HEAD FINDINGS ANTERIOR CIRCULATION: Examination markedly degraded by motion artifact. Distal cervical segments of the internal carotid arteries are patent with antegrade flow. There is an apparent short-segment severe stenosis at the right cervical petrous junction (series 10, image 51). Petrous segments patent bilaterally. Cavernous and supraclinoid segments patent without appreciable stenosis. Origin of the ophthalmic arteries patent. ICA termini widely patent. A1 segments patent bilaterally. Left A1 appears to be partially duplicated. Grossly normal anterior communicating artery. Anterior cerebral arteries  patent to their distal aspects without obvious stenosis. M1 segments patent without stenosis. Grossly normal MCA bifurcations. Probable short-segment mild to moderate proximal left M2 stenosis, superior division (series 1034, image 1). Distal MCA branches perfused and fairly symmetric POSTERIOR CIRCULATION: Dominant left vertebral artery patent to the vertebrobasilar junction without stenosis. Partially visualized left PICA patent. Hypoplastic right vertebral artery visualized within the distal neck, but not visualized at the level of the foramen magnum, and may be occluded. Minimal flow within the distal right V4 segment at the vertebrobasilar junction, which may in part be due to collateralization. Right PICA faintly visualized. Basilar patent to its distal aspect without stenosis. Superior cerebral arteries patent bilaterally. Right PCA  supplied via the basilar. Left PCA supplied via the basilar as well as a prominent posterior communicating artery. PCAs patent to their distal aspects without appreciable flow-limiting stenosis. Evaluation for aneurysm somewhat limited on this motion degraded exam. No obvious aneurysm identified. Irregular blush of contrast seen adjacent to the cavernous ICAs favored to be artifactual due to motion (series 10, image 98). IMPRESSION: MRI HEAD IMPRESSION: 1. Moderately motion degraded examination. 2. No acute intracranial infarct or other abnormality identified. 3. Age-related cerebral atrophy with moderate chronic small vessel ischemic disease. MRA HEAD IMPRESSION: 1. Moderately motion degraded examination. 2. Short-segment severe stenosis at the right cervical-petrous junction. Otherwise widely patent anterior circulation. 3. Nonvisualization of the hypoplastic right vertebral artery at the skull base, which may be occluded. Dominant left vertebral artery widely patent as is the remainder of the vertebrobasilar circulation. Electronically Signed   By: Jeannine Boga M.D.   On: 06/10/2018 07:31   Mr Brain Wo Contrast  Result Date: 06/10/2018 CLINICAL DATA:  Initial evaluation for acute stroke.  Dysarthria. EXAM: MRI HEAD WITHOUT CONTRAST MRA HEAD WITHOUT CONTRAST TECHNIQUE: Multiplanar, multiecho pulse sequences of the brain and surrounding structures were obtained without intravenous contrast. Angiographic images of the head were obtained using MRA technique without contrast. COMPARISON:  Prior CT from earlier the same day. FINDINGS: MRI HEAD FINDINGS Brain: Examination moderately degraded by motion artifact. Generalized cerebral atrophy. Advanced chronic microvascular ischemic disease seen involving the supratentorial cerebral white matter. Prominent perivascular spaces present within the bilateral basal ganglia extending into the midbrain/pons. Few small remote lacunar infarct present at the right  thalamus/right cerebral peduncle. No abnormal foci of restricted diffusion to suggest acute or subacute ischemia. Gray-white matter differentiation maintained. No evidence for acute intracranial hemorrhage. Few small chronic micro hemorrhages noted within the right thalamus. No mass lesion, midline shift or mass effect. No hydrocephalus. No extra-axial fluid collection. Pituitary gland grossly unremarkable. Vascular: Right vertebral artery likely hypoplastic and not well visualized. Major intracranial vascular flow voids otherwise maintained. Intracranial circulation somewhat dolichoectatic in appearance. Skull and upper cervical spine: Craniocervical junction unremarkable. Bone marrow signal intensity within normal limits. No scalp soft tissue abnormality. Sinuses/Orbits: Globes and orbital soft tissues demonstrate no acute finding. Patient status post ocular lens replacement bilaterally. Paranasal sinuses are largely clear. Trace opacity right mastoid air cells, of doubtful significance. Other: None. MRA HEAD FINDINGS ANTERIOR CIRCULATION: Examination markedly degraded by motion artifact. Distal cervical segments of the internal carotid arteries are patent with antegrade flow. There is an apparent short-segment severe stenosis at the right cervical petrous junction (series 10, image 51). Petrous segments patent bilaterally. Cavernous and supraclinoid segments patent without appreciable stenosis. Origin of the ophthalmic arteries patent. ICA termini widely patent. A1 segments patent bilaterally. Left A1 appears to be partially duplicated. Grossly normal anterior communicating  artery. Anterior cerebral arteries patent to their distal aspects without obvious stenosis. M1 segments patent without stenosis. Grossly normal MCA bifurcations. Probable short-segment mild to moderate proximal left M2 stenosis, superior division (series 1034, image 1). Distal MCA branches perfused and fairly symmetric POSTERIOR CIRCULATION:  Dominant left vertebral artery patent to the vertebrobasilar junction without stenosis. Partially visualized left PICA patent. Hypoplastic right vertebral artery visualized within the distal neck, but not visualized at the level of the foramen magnum, and may be occluded. Minimal flow within the distal right V4 segment at the vertebrobasilar junction, which may in part be due to collateralization. Right PICA faintly visualized. Basilar patent to its distal aspect without stenosis. Superior cerebral arteries patent bilaterally. Right PCA supplied via the basilar. Left PCA supplied via the basilar as well as a prominent posterior communicating artery. PCAs patent to their distal aspects without appreciable flow-limiting stenosis. Evaluation for aneurysm somewhat limited on this motion degraded exam. No obvious aneurysm identified. Irregular blush of contrast seen adjacent to the cavernous ICAs favored to be artifactual due to motion (series 10, image 98). IMPRESSION: MRI HEAD IMPRESSION: 1. Moderately motion degraded examination. 2. No acute intracranial infarct or other abnormality identified. 3. Age-related cerebral atrophy with moderate chronic small vessel ischemic disease. MRA HEAD IMPRESSION: 1. Moderately motion degraded examination. 2. Short-segment severe stenosis at the right cervical-petrous junction. Otherwise widely patent anterior circulation. 3. Nonvisualization of the hypoplastic right vertebral artery at the skull base, which may be occluded. Dominant left vertebral artery widely patent as is the remainder of the vertebrobasilar circulation. Electronically Signed   By: Jeannine Boga M.D.   On: 06/10/2018 07:31    Procedures Procedures (including critical care time)  Medications Ordered in ED Medications  sodium chloride 0.9 % bolus 1,000 mL (1,000 mLs Intravenous New Bag/Given 06/10/18 1930)  sodium chloride 0.9 % bolus 1,000 mL (0 mLs Intravenous Stopped 06/10/18 1841)     Initial  Impression / Assessment and Plan / ED Course  I have reviewed the triage vital signs and the nursing notes.  Pertinent labs & imaging results that were available during my care of the patient were reviewed by me and considered in my medical decision making (see chart for details).     84 yo F with a chief complaint of altered mental status.  The family feels that she has been very confused and they have been having trouble taking care of her.  She lives normally at home by herself.  The son is been going over to her house very frequently trying to help her.  She was seen yesterday in the emergency department had a very thorough work-up including MRI of the brain which was negative.  She was continuing to have confusion and started complaining of dysuria and difficulty urinating and urgency and so they brought her to the ED for evaluation.  TSH normal, hyponatremic, no leukocytosis. UA without infection.  The patient was given a liter of fluids with some initial improvement in mental status per me but on reassessment the patient was profoundly confused.  The family states that this has been going on she lives by herself and she is unsafe to go back home.  They have gotten more history from a friend who stated that she was at her normal state until after she got a B12 injection and had lunch and then could not remember how to start a car.  This was a couple days ago.  I discussed the case with Dr. Leonel Ramsay,  neurology after reviewing her chart and hearing the history he suspected most likely that this was delirium may be secondary to dehydration.  With her renal insufficiency there may be a cause from 1 of her medications though there is not one listed on her med list that he suspects could cause her symptoms.  He felt that it would be reasonable to place the patient in observation and continue to hydrate her gently.  If this resolves then it would be suspected to be due to dehydration, if the patient  continues to be delirious at that point he felt that an EEG and a neurology consult would be warranted.  Discussed with Dr. Shanon Brow, recommended CXR, trop, ecg.    The patients results and plan were reviewed and discussed.   Any x-rays performed were independently reviewed by myself.   Differential diagnosis were considered with the presenting HPI.  Medications  sodium chloride 0.9 % bolus 1,000 mL (1,000 mLs Intravenous New Bag/Given 06/10/18 1930)  sodium chloride 0.9 % bolus 1,000 mL (0 mLs Intravenous Stopped 06/10/18 1841)    Vitals:   06/10/18 1730 06/10/18 1830 06/10/18 1930 06/10/18 1945  BP: (!) 168/95 (!) 166/99 (!) 160/83 (!) 179/98  Pulse: 85 86 65 79  Resp: 20 15 19 17   Temp:      TempSrc:      SpO2: 97% 99% 98% 96%    Final diagnoses:  Acute delirium    Admission/ observation were discussed with the admitting physician, patient and/or family and they are comfortable with the plan.    Final Clinical Impressions(s) / ED Diagnoses   Final diagnoses:  Acute delirium    ED Discharge Orders    None       Deno Etienne, DO 06/10/18 2028

## 2018-06-11 ENCOUNTER — Inpatient Hospital Stay (HOSPITAL_COMMUNITY): Payer: Medicare Other

## 2018-06-11 ENCOUNTER — Encounter (HOSPITAL_COMMUNITY): Payer: Self-pay | Admitting: Neurology

## 2018-06-11 DIAGNOSIS — E1122 Type 2 diabetes mellitus with diabetic chronic kidney disease: Secondary | ICD-10-CM | POA: Diagnosis present

## 2018-06-11 DIAGNOSIS — Z9114 Patient's other noncompliance with medication regimen: Secondary | ICD-10-CM | POA: Diagnosis not present

## 2018-06-11 DIAGNOSIS — R4701 Aphasia: Secondary | ICD-10-CM | POA: Diagnosis present

## 2018-06-11 DIAGNOSIS — E079 Disorder of thyroid, unspecified: Secondary | ICD-10-CM | POA: Diagnosis not present

## 2018-06-11 DIAGNOSIS — M810 Age-related osteoporosis without current pathological fracture: Secondary | ICD-10-CM | POA: Diagnosis present

## 2018-06-11 DIAGNOSIS — Z9013 Acquired absence of bilateral breasts and nipples: Secondary | ICD-10-CM | POA: Diagnosis not present

## 2018-06-11 DIAGNOSIS — R278 Other lack of coordination: Secondary | ICD-10-CM | POA: Diagnosis not present

## 2018-06-11 DIAGNOSIS — R2681 Unsteadiness on feet: Secondary | ICD-10-CM | POA: Diagnosis not present

## 2018-06-11 DIAGNOSIS — M6281 Muscle weakness (generalized): Secondary | ICD-10-CM | POA: Diagnosis not present

## 2018-06-11 DIAGNOSIS — Z853 Personal history of malignant neoplasm of breast: Secondary | ICD-10-CM | POA: Diagnosis not present

## 2018-06-11 DIAGNOSIS — G9341 Metabolic encephalopathy: Secondary | ICD-10-CM | POA: Diagnosis not present

## 2018-06-11 DIAGNOSIS — R412 Retrograde amnesia: Secondary | ICD-10-CM | POA: Diagnosis not present

## 2018-06-11 DIAGNOSIS — E860 Dehydration: Secondary | ICD-10-CM | POA: Diagnosis present

## 2018-06-11 DIAGNOSIS — E538 Deficiency of other specified B group vitamins: Secondary | ICD-10-CM | POA: Diagnosis present

## 2018-06-11 DIAGNOSIS — N183 Chronic kidney disease, stage 3 (moderate): Secondary | ICD-10-CM | POA: Diagnosis present

## 2018-06-11 DIAGNOSIS — I10 Essential (primary) hypertension: Secondary | ICD-10-CM | POA: Diagnosis not present

## 2018-06-11 DIAGNOSIS — E890 Postprocedural hypothyroidism: Secondary | ICD-10-CM | POA: Diagnosis present

## 2018-06-11 DIAGNOSIS — R4781 Slurred speech: Secondary | ICD-10-CM | POA: Diagnosis present

## 2018-06-11 DIAGNOSIS — R41841 Cognitive communication deficit: Secondary | ICD-10-CM | POA: Diagnosis not present

## 2018-06-11 DIAGNOSIS — E611 Iron deficiency: Secondary | ICD-10-CM | POA: Diagnosis not present

## 2018-06-11 DIAGNOSIS — E785 Hyperlipidemia, unspecified: Secondary | ICD-10-CM | POA: Diagnosis present

## 2018-06-11 DIAGNOSIS — K5900 Constipation, unspecified: Secondary | ICD-10-CM | POA: Diagnosis not present

## 2018-06-11 DIAGNOSIS — Z7982 Long term (current) use of aspirin: Secondary | ICD-10-CM | POA: Diagnosis not present

## 2018-06-11 DIAGNOSIS — R410 Disorientation, unspecified: Secondary | ICD-10-CM | POA: Diagnosis present

## 2018-06-11 DIAGNOSIS — I6602 Occlusion and stenosis of left middle cerebral artery: Secondary | ICD-10-CM | POA: Diagnosis not present

## 2018-06-11 DIAGNOSIS — I129 Hypertensive chronic kidney disease with stage 1 through stage 4 chronic kidney disease, or unspecified chronic kidney disease: Secondary | ICD-10-CM | POA: Diagnosis present

## 2018-06-11 DIAGNOSIS — E1151 Type 2 diabetes mellitus with diabetic peripheral angiopathy without gangrene: Secondary | ICD-10-CM | POA: Diagnosis present

## 2018-06-11 DIAGNOSIS — G9340 Encephalopathy, unspecified: Secondary | ICD-10-CM | POA: Diagnosis not present

## 2018-06-11 DIAGNOSIS — D638 Anemia in other chronic diseases classified elsewhere: Secondary | ICD-10-CM | POA: Diagnosis present

## 2018-06-11 DIAGNOSIS — E119 Type 2 diabetes mellitus without complications: Secondary | ICD-10-CM | POA: Diagnosis not present

## 2018-06-11 DIAGNOSIS — I674 Hypertensive encephalopathy: Secondary | ICD-10-CM | POA: Diagnosis present

## 2018-06-11 DIAGNOSIS — I6601 Occlusion and stenosis of right middle cerebral artery: Secondary | ICD-10-CM | POA: Diagnosis present

## 2018-06-11 DIAGNOSIS — E871 Hypo-osmolality and hyponatremia: Secondary | ICD-10-CM | POA: Diagnosis present

## 2018-06-11 DIAGNOSIS — I6521 Occlusion and stenosis of right carotid artery: Secondary | ICD-10-CM | POA: Diagnosis not present

## 2018-06-11 DIAGNOSIS — Z79899 Other long term (current) drug therapy: Secondary | ICD-10-CM | POA: Diagnosis not present

## 2018-06-11 DIAGNOSIS — E876 Hypokalemia: Secondary | ICD-10-CM | POA: Diagnosis not present

## 2018-06-11 DIAGNOSIS — F0150 Vascular dementia without behavioral disturbance: Secondary | ICD-10-CM | POA: Diagnosis present

## 2018-06-11 LAB — CBC
HEMATOCRIT: 32.2 % — AB (ref 36.0–46.0)
Hemoglobin: 10.4 g/dL — ABNORMAL LOW (ref 12.0–15.0)
MCH: 28.2 pg (ref 26.0–34.0)
MCHC: 32.3 g/dL (ref 30.0–36.0)
MCV: 87.3 fL (ref 78.0–100.0)
Platelets: 208 10*3/uL (ref 150–400)
RBC: 3.69 MIL/uL — ABNORMAL LOW (ref 3.87–5.11)
RDW: 14.4 % (ref 11.5–15.5)
WBC: 8.2 10*3/uL (ref 4.0–10.5)

## 2018-06-11 LAB — GLUCOSE, CAPILLARY: GLUCOSE-CAPILLARY: 110 mg/dL — AB (ref 70–99)

## 2018-06-11 LAB — BASIC METABOLIC PANEL
Anion gap: 9 (ref 5–15)
BUN: 12 mg/dL (ref 8–23)
CO2: 27 mmol/L (ref 22–32)
Calcium: 8.3 mg/dL — ABNORMAL LOW (ref 8.9–10.3)
Chloride: 99 mmol/L (ref 98–111)
Creatinine, Ser: 1.25 mg/dL — ABNORMAL HIGH (ref 0.44–1.00)
GFR calc Af Amer: 44 mL/min — ABNORMAL LOW (ref >60)
GFR, EST NON AFRICAN AMERICAN: 38 mL/min — AB (ref >60)
GLUCOSE: 126 mg/dL — AB (ref 70–99)
Potassium: 3.9 mmol/L (ref 3.5–5.1)
Sodium: 135 mmol/L (ref 135–145)

## 2018-06-11 LAB — T4: T4, Total: 8.8 ug/dL (ref 4.5–12.0)

## 2018-06-11 NOTE — Progress Notes (Signed)
EEG complete - results pending 

## 2018-06-11 NOTE — Progress Notes (Signed)
Patient arrived to the unit via bed from the emergency department.  Patient is alert but very confused and combative.   Patient refuses to listen to staff directions and continues to swing at the staff when approached. Patient was able to calm down after speaking to her son Aaron Edelman on the phone. Skin assessment complete.  No skin issues.  Patient refuses to follow directions for the neurological assessment but swallowed her scheduled med's for tonight.  Placed the patients bed alarm on and lowered the bed.  Will continue to monitor the patient and notify MD as needed

## 2018-06-11 NOTE — Evaluation (Signed)
Clinical/Bedside Swallow Evaluation Patient Details  Name: Alisha [name] MRN: [id number] Date of Birth: 1932/04/17  Today's Date: 06/11/2018 Time: SLP Start Time (ACUTE ONLY): 1151 SLP Stop Time (ACUTE ONLY): 1205 SLP Time Calculation (min) (ACUTE ONLY): 14 min  Past Medical History:  Past Medical History:  Diagnosis Date  . Anemia    takes Ferrous Sulfate daily  . Arthritis   . B12 deficiency    takes Vit B12 Q3 months  . Blood transfusion   . Colon polyps   . Diabetes mellitus    takes Metformin and Glipizide daily  . Hemorrhoids   . History of blood transfusion    no abnormal reaction noted  . Hyperlipidemia    takes Zetia daily  . Hypertension    takes Diovan and Metoprolol daily  . Joint pain   . Multiple thyroid nodules    takes Synthroid daily  . Osteoporosis    takes Fosamax every Wed  . Peripheral vascular disease ([place])    DVT  2008 with ? anticoagulation  . PONV (postoperative nausea and vomiting)    Past Surgical History:  Past Surgical History:  Procedure Laterality Date  . COLONOSCOPY WITH PROPOFOL N/A 12/06/2014   Procedure: COLONOSCOPY WITH PROPOFOL;  Surgeon: Garlan Fair, MD;  Location: [facility];  Service: Endoscopy;  Laterality: N/A;  . ESOPHAGOGASTRODUODENOSCOPY (EGD) WITH PROPOFOL N/A 12/06/2014   Procedure: ESOPHAGOGASTRODUODENOSCOPY (EGD) WITH PROPOFOL;  Surgeon: Garlan Fair, MD;  Location: [facility];  Service: Endoscopy;  Laterality: N/A;  . FEMUR SURGERY Left 07/08/11   plates and screws  . FRACTURE SURGERY     8/12 femur fx with rod and bolt placed  . HEMORRHOIDECTOMY WITH HEMORRHOID BANDING    . HERNIA REPAIR  1945  . MASTECTOMY W/ SENTINEL NODE BIOPSY Right 05/13/2014   Procedure: MASTECTOMY WITH SENTINEL LYMPH NODE BIOPSY;  Surgeon: Stark Klein, MD;  Location: [place];  Service: General;  Laterality: Right;  . THYROIDECTOMY  12/04/2011   Procedure: THYROIDECTOMY;  Surgeon: Earnstine Regal, MD;  Location: [facility];  Service: General;   Laterality: N/A;  total thyroidectomy   HPI:  84 year old female admitted with confusion. MRI without CVA, sent home but re-admitted. Per neurology, question dehydration as cause. Admitted wtih encephalopathy of unclear origin. CXR with low lung volumes without focal chest disease. Passed RN stroke swallow screen but per RN, coughing with meds.    Assessment / Plan / Recommendation Clinical Impression  Patient presents with a normal oropharyngeal dysphagia with full oral clearance and no overt s/s of aspiration. Self feeding difficulty noted secondary to confusion. Noted what also appears to be some mild right sided inattention however note that MRI negative. No further SLP services indicated however pending origin of confusion, patient may benefit from SLP services in the future to address cognition.  SLP Visit Diagnosis: Dysphagia, unspecified (R13.10)    Aspiration Risk  Mild aspiration risk    Diet Recommendation Regular;Thin liquid   Liquid Administration via: Cup;Straw Medication Administration: Whole meds with liquid Supervision: Patient able to self feed;Intermittent supervision to cue for compensatory strategies Compensations: Slow rate;Small sips/bites;Minimize environmental distractions Postural Changes: Seated upright at 90 degrees    Other  Recommendations Oral Care Recommendations: Oral care BID   Follow up Recommendations None      Frequency and Duration            Prognosis        Swallow Study   General HPI: 84 year old female  admitted with confusion. MRI without CVA, sent home but re-admitted. Per neurology, question dehydration as cause. Admitted wtih encephalopathy of unclear origin. CXR with low lung volumes without focal chest disease. Passed RN stroke swallow screen but per RN, coughing with meds.  Type of Study: Bedside Swallow Evaluation Previous Swallow Assessment: none noted Diet Prior to this Study: Regular;Thin liquids Temperature Spikes Noted:  No Respiratory Status: Room air History of Recent Intubation: No Behavior/Cognition: Alert;Cooperative;Pleasant mood;Confused Oral Cavity Assessment: Within Functional Limits Oral Care Completed by SLP: Recent completion by staff Oral Cavity - Dentition: Dentures, top;Dentures, bottom Vision: Functional for self-feeding Self-Feeding Abilities: Able to feed self Patient Positioning: Upright in bed Baseline Vocal Quality: Normal Volitional Cough: Strong Volitional Swallow: Able to elicit    Oral/Motor/Sensory Function Overall Oral Motor/Sensory Function: Within functional limits   Ice Chips Ice chips: Not tested   Thin Liquid Thin Liquid: Within functional limits Presentation: Cup;Self Fed;Straw    Nectar Thick Nectar Thick Liquid: Not tested   Honey Thick Honey Thick Liquid: Not tested   Puree Puree: Within functional limits Presentation: Spoon;Self Fed   Solid   GO  Alisha Hillman MA, CCC-SLP   Solid: Within functional limits Presentation: Alisha [name] 06/11/2018,12:15 PM

## 2018-06-11 NOTE — Consult Note (Addendum)
Neurology Consultation  Reason for Consult: xu Referring Physician: Confusion  History is obtained from: Son  HPI: Alisha [name] is a 85 y.o. femalewith a history of significant hypertension, diabetes who was brought to the hospital by family secondary to noting that she had been more confused than usual or over the past 2 days.  Lately she lives alone and at baseline she is highly functional.  Of note over the past 48 hours she had not been taking any of her blood pressure medications.  Of note on arrival patient's blood pressure ranged between 164/126, 177/91, 179/98 and currently today they brought the blood pressure down from 168/72 to 108/49.  As noted she has been afebrile through hospitalization.  Pulse has been within normal rate and respiratory has been within normal rate.  Is also been sating within normal limits.   Majority of the history is obtained from the son.  Apparently they noted on Monday morning that she just was not herself while she was eating breakfast with some friends.  The son noticed this also and came down to see her approximately around 3:00 in the afternoon.  His main issue was noting that she has some difficulty finding words and expressing herself.  For that reason she came to the hospital.  As noted above her blood pressure was significantly elevated for her as her son states.  She usually sits in the 397-673 systolically.  She is also complaining of a headache at that time.  She did not complain of any blurred vision.  He states that over the period of time that she is in the hospital she has improved a little bit however today is the first day that her blood pressure systolically has been down to the normal range of 108.   CT/MRI/MRA of brain was obtained and did not show any significant abnormalities.   ROS: A 14 point ROS was performed and is negative except as noted in the HPI.   Past Medical History:  Diagnosis Date  . Anemia    takes Ferrous Sulfate daily   . Arthritis   . B12 deficiency    takes Vit B12 Q3 months  . Blood transfusion   . Colon polyps   . Diabetes mellitus    takes Metformin and Glipizide daily  . Hemorrhoids   . History of blood transfusion    no abnormal reaction noted  . Hyperlipidemia    takes Zetia daily  . Hypertension    takes Diovan and Metoprolol daily  . Joint pain   . Multiple thyroid nodules    takes Synthroid daily  . Osteoporosis    takes Fosamax every Wed  . Peripheral vascular disease ([place])    DVT  2008 with ? anticoagulation  . PONV (postoperative nausea and vomiting)     Family History  Problem Relation Age of Onset  . Hypertension Mother   . Hypertension Father     Social History:   reports that she has never smoked. She has never used smokeless tobacco. She reports that she does not drink alcohol or use drugs.  Medications  Current Facility-Administered Medications:  .  0.9 %  sodium chloride infusion, , Intravenous, Continuous, Derrill Kay A, MD, Last Rate: 100 mL/hr at 06/11/18 1015, 1,000 mL at 06/11/18 1015 .  aspirin EC tablet 81 mg, 81 mg, Oral, Daily, Derrill Kay A, MD, 81 mg at 06/11/18 0916 .  calcium-vitamin D (OSCAL WITH D) 500-200 MG-UNIT per tablet 1 tablet, 1 tablet,  Oral, q morning - 10a, Phillips Grout, MD, 1 tablet at 06/11/18 [id number] .  ezetimibe (ZETIA) tablet 10 mg, 10 mg, Oral, QAC breakfast, Derrill Kay A, MD, 10 mg at 06/11/18 0815 .  ferrous sulfate tablet 325 mg, 325 mg, Oral, BID WC, Derrill Kay A, MD, 325 mg at 06/11/18 0815 .  glipiZIDE (GLUCOTROL XL) 24 hr tablet 5 mg, 5 mg, Oral, QAC breakfast, Derrill Kay A, MD, 5 mg at 06/11/18 0815 .  hydrALAZINE (APRESOLINE) injection 20 mg, 20 mg, Intravenous, Q4H PRN, Phillips Grout, MD .  losartan (COZAAR) tablet 100 mg, 100 mg, Oral, Daily, 100 mg at 06/11/18 0916 **AND** hydrochlorothiazide (HYDRODIURIL) tablet 25 mg, 25 mg, Oral, Daily, Derrill Kay A, MD, 25 mg at 06/11/18 0916 .  levothyroxine  (SYNTHROID, LEVOTHROID) tablet 112 mcg, 112 mcg, Oral, QAC breakfast, Phillips Grout, MD, 112 mcg at 06/11/18 0815 .  metoprolol succinate (TOPROL-XL) 24 hr tablet 100 mg, 100 mg, Oral, QAC breakfast, Derrill Kay A, MD, 100 mg at 06/10/18 2357 .  multivitamin with minerals tablet 1 tablet, 1 tablet, Oral, q morning - 10a, Phillips Grout, MD, 1 tablet at 06/11/18 0998   Exam: Current vital signs: BP (!) 108/49 (BP Location: Left Arm)   Pulse 89   Temp 97.7 F (36.5 C) (Oral)   Resp 18   Ht 5\' 2"  (1.575 m)   Wt 69.2 kg (152 lb 8.9 oz)   SpO2 100%   BMI 27.90 kg/m  Vital signs in last 24 hours: Temp:  [97.7 F (36.5 C)-98.9 F (37.2 C)] 97.7 F (36.5 C) (07/24 0426) Pulse Rate:  [65-92] 89 (07/24 0426) Resp:  [15-21] 18 (07/24 0426) BP: (108-179)/(49-126) 108/49 (07/24 0426) SpO2:  [96 %-100 %] 100 % (07/23 2340) Weight:  [69.2 kg (152 lb 8.9 oz)] 69.2 kg (152 lb 8.9 oz) (07/24 0426)  GENERAL: Awake, alert in NAD ABDOMEN - Soft, nontender, nondistended with normoactive BS Ext: warm, well perfused, intact peripheral pulses,  NEURO:  Mental Status: Patient is alert to hospital, month of July, but not year, she is unable to tell me her birthday, she is able to name her son but not her grandson.  She is able to do simple steps that showing me her thumb and her pinky but cannot do complex steps such as taking her right thumb touching her left ear and pointing to the ceiling.  She is able to name objects such as my watch,  my badge, but is still noticeably slow with her thought processing. Language: speech is clear.  Naming, repetition, fluency, and comprehension intact. Cranial Nerves: PERRL 2 mm/brisk. EOMI, visual fields full, no facial asymmetry, facial sensation intact, hearing intact, tongue/uvula/soft palate midline, Motor: 5/5 Tone: is normal and bulk is normal Sensation- Intact to light touch bilaterally Coordination: FTN intact bilaterally, no ataxia in BLE. Gait-  deferred     Labs I have reviewed labs in epic and the results pertinent to this consultation are:   CBC    Component Value Date/Time   WBC 8.2 06/11/2018 0617   RBC 3.69 (L) 06/11/2018 0617   HGB 10.4 (L) 06/11/2018 0617   HCT 32.2 (L) 06/11/2018 0617   PLT 208 06/11/2018 0617   MCV 87.3 06/11/2018 0617   MCH 28.2 06/11/2018 0617   MCHC 32.3 06/11/2018 0617   RDW 14.4 06/11/2018 0617   LYMPHSABS 1.0 06/10/2018 1551   MONOABS 0.7 06/10/2018 1551   EOSABS 0.0 06/10/2018 1551   BASOSABS 0.0  06/10/2018 1551    CMP     Component Value Date/Time   NA 135 06/11/2018 0617   K 3.9 06/11/2018 0617   CL 99 06/11/2018 0617   CO2 27 06/11/2018 0617   GLUCOSE 126 (H) 06/11/2018 0617   BUN 12 06/11/2018 0617   CREATININE 1.25 (H) 06/11/2018 0617   CALCIUM 8.3 (L) 06/11/2018 0617   PROT 4.9 (L) 06/10/2018 2204   ALBUMIN 2.7 (L) 06/10/2018 2204   AST 16 06/10/2018 2204   ALT 13 06/10/2018 2204   ALKPHOS 31 (L) 06/10/2018 2204   BILITOT 0.8 06/10/2018 2204   GFRNONAA 38 (L) 06/11/2018 0617   GFRAA 44 (L) 06/11/2018 0617    Lipid Panel  No results found for: CHOL, TRIG, HDL, CHOLHDL, VLDL, LDLCALC, LDLDIRECT   Imaging I have reviewed the images obtained:  CT-scan of the brain--No CT evidence for acute intracranial abnormality.  MRI examination of the brain--No acute intracranial infarct or other abnormality identified.Age-related cerebral atrophy with moderate chronic small vessel ischemic disease  MRA head--Short-segment severe stenosis at the right cervical-petrous junction. Otherwise widely patent anterior circulation.  Nonvisualization of the hypoplastic right vertebral artery at the skull base, which may be occluded. Dominant left vertebral artery widely patent as is the remainder of the vertebrobasilar circulation.  Assessment: 84 year old female presented to the hospital with elevated blood pressures and headache which was associated with also slow thought  processing and difficulty finding words.      NEUROHOSPITALIST ADDENDUM Seen and examined the patient today. I have reviewed the contents of history and physical exam as documented by PA/ARNP/Resident and agree with above documentation.  I have discussed and formulated the above plan as documented. Edits to the note have been made as needed.   Impression Encephalopathy, possibly from elevated HTN.   Plan Routine EEG: negative  F/U B1,B12 Findings on MRA likely chronic- do not feel there is an indication for cerebral angiogram given no focal deficits and MRI brain is negative.   Will continue to follow.    Karena Addison Khrystian Schauf MD [facility] [phone]   If 7pm to 7am, please call on call as listed on AMION.

## 2018-06-11 NOTE — Procedures (Signed)
ELECTROENCEPHALOGRAM REPORT   Patient: Alisha [name]       Room #: 7W80S EEG No. ID: [id number] Age: 84 y.o.        Sex: female Referring Physician: Erlinda Hong Report Date:  06/11/2018        Interpreting Physician: Alexis Goodell  History: Alisha [name] is an 84 y.o. female with altered mental status  Medications:  ASA, [place], Ferrous Sulfate, Glucotrol, HCTZ, Synthroid, Cozaar, Toprol, MVI  Conditions of Recording:  This is a 21 channel routine scalp EEG performed with bipolar and monopolar montages arranged in accordance to the international 10/20 system of electrode placement. One channel was dedicated to EKG recording.  The patient is in the awake and drowsy state.  Description:  Muscle and movement artifact are prominent during the recording.  When visualized the waking background activity consists of a low voltage, symmetrical, fairly well organized, 9-10 Hz alpha activity, seen from the parieto-occipital and posterior temporal regions.  Low voltage fast activity, poorly organized, is seen anteriorly and is at times superimposed on more posterior regions.  A mixture of theta and alpha rhythms are seen from the central and temporal regions. The patient drowses with slowing to irregular, low voltage theta and beta activity.   Stage II sleep is not obtained. No epileptiform activity is noted.   Hyperventilation was not performed and intermittent photic stimulation were not performed.   IMPRESSION: This is a normal awake and drowsy electroencephalogram.  No epileptiform activity is noted.     Alexis Goodell, MD Neurology [phone] 06/11/2018, 7:55 PM

## 2018-06-11 NOTE — Consult Note (Signed)
Chief Complaint: Patient was seen in consultation today for cerebral arteriogram Chief Complaint  Patient presents with  . Altered Mental Status  . Urinary Tract Infection   at the request of Dr Dillard Cannon   Supervising Physician: Luanne Bras  Patient Status: [facility] - In-pt  History of Present Illness: Alisha [name] is a 84 y.o. female   Hx HTN; DM Onset confusion and memory loss Headache  Non compliant with BP meds Readings as high as 170s/126. Presented to ED for evaluation  CT Head wnl MRA:  MRA HEAD IMPRESSION: 1. Moderately motion degraded examination. 2. Short-segment severe stenosis at the right cervical-petrous junction. Otherwise widely patent anterior circulation. 3. Nonvisualization of the hypoplastic right vertebral artery at the skull base, which may be occluded. Dominant left vertebral artery widely patent as is the remainder of the vertebrobasilar circulation.  Dr Margaretha Glassing has asked Dr Estanislado Pandy for evaluation Pt is tentatively scheduled for cerebral arteriogram 7/26 in IR (pt and family discussing procedure and will decide tomorrow)    Past Medical History:  Diagnosis Date  . Anemia    takes Ferrous Sulfate daily  . Arthritis   . B12 deficiency    takes Vit B12 Q3 months  . Blood transfusion   . Colon polyps   . Diabetes mellitus    takes Metformin and Glipizide daily  . Hemorrhoids   . History of blood transfusion    no abnormal reaction noted  . Hyperlipidemia    takes Zetia daily  . Hypertension    takes Diovan and Metoprolol daily  . Joint pain   . Multiple thyroid nodules    takes Synthroid daily  . Osteoporosis    takes Fosamax every Wed  . Peripheral vascular disease ([place])    DVT  2008 with ? anticoagulation  . PONV (postoperative nausea and vomiting)     Past Surgical History:  Procedure Laterality Date  . COLONOSCOPY WITH PROPOFOL N/A 12/06/2014   Procedure: COLONOSCOPY WITH PROPOFOL;  Surgeon: Garlan Fair, MD;   Location: [facility];  Service: Endoscopy;  Laterality: N/A;  . ESOPHAGOGASTRODUODENOSCOPY (EGD) WITH PROPOFOL N/A 12/06/2014   Procedure: ESOPHAGOGASTRODUODENOSCOPY (EGD) WITH PROPOFOL;  Surgeon: Garlan Fair, MD;  Location: [facility];  Service: Endoscopy;  Laterality: N/A;  . FEMUR SURGERY Left 07/08/11   plates and screws  . FRACTURE SURGERY     8/12 femur fx with rod and bolt placed  . HEMORRHOIDECTOMY WITH HEMORRHOID BANDING    . HERNIA REPAIR  1945  . MASTECTOMY W/ SENTINEL NODE BIOPSY Right 05/13/2014   Procedure: MASTECTOMY WITH SENTINEL LYMPH NODE BIOPSY;  Surgeon: Stark Klein, MD;  Location: [place];  Service: General;  Laterality: Right;  . THYROIDECTOMY  12/04/2011   Procedure: THYROIDECTOMY;  Surgeon: Earnstine Regal, MD;  Location: [facility];  Service: General;  Laterality: N/A;  total thyroidectomy    Allergies: Ace inhibitors; Codeine; Evista [raloxifene hydrochloride]; and Zocor [simvastatin - high dose]  Medications: Prior to Admission medications   Medication Sig Start Date End Date Taking? Authorizing Provider  Calcium Carb-Cholecalciferol (CALCIUM 600 + D) 600-200 MG-UNIT TABS Take 1 tablet by mouth every morning.    Yes [provider]  cyanocobalamin (,VITAMIN B-12,) 1000 MCG/ML injection Inject 1,000 mcg into the muscle every 3 (three) months.   Yes [provider]  ferrous sulfate 325 (65 FE) MG tablet Take 325 mg by mouth 2 (two) times daily with a meal.   Yes [provider]  GLIPIZIDE  XL 5 MG 24 hr tablet Take 5 mg by mouth daily before breakfast.  09/28/11  Yes [provider]  levothyroxine (SYNTHROID, LEVOTHROID) 112 MCG tablet Take 112 mcg by mouth daily before breakfast.    Yes [provider]  losartan-hydrochlorothiazide (HYZAAR) 100-25 MG tablet Take 1 tablet by mouth daily. 05/19/18  Yes [provider]  metFORMIN (GLUCOPHAGE) 850 MG tablet Take 850 mg by mouth 3 (three) times daily.  09/04/11  Yes  [provider]  metoprolol (TOPROL-XL) 100 MG 24 hr tablet Take 100 mg by mouth daily before breakfast.  09/28/11  Yes [provider]  Multiple Vitamin (MULTIVITAMIN WITH MINERALS) TABS tablet Take 1 tablet by mouth every morning.   Yes [provider]  ZETIA 10 MG tablet Take 10 mg by mouth daily before breakfast.  09/28/11  Yes [provider]  aspirin EC 81 MG tablet Take 1 tablet (81 mg total) by mouth daily. 06/10/18   Jola Schmidt, MD  UNABLE TO FIND 174.9 Right Mastectomy   L8000- Post Surgical Bras-6  H6759- Silicone Breast Prosthesis-1 07/21/14   Stark Klein, MD     Family History  Problem Relation Age of Onset  . Hypertension Mother   . Hypertension Father     Social History   Socioeconomic History  . Marital status: Widowed    Spouse name: Not on file  . Number of children: Not on file  . Years of education: Not on file  . Highest education level: Not on file  Occupational History  . Not on file  Social Needs  . Financial resource strain: Not on file  . Food insecurity:    Worry: Not on file    Inability: Not on file  . Transportation needs:    Medical: Not on file    Non-medical: Not on file  Tobacco Use  . Smoking status: Never Smoker  . Smokeless tobacco: Never Used  Substance and Sexual Activity  . Alcohol use: No  . Drug use: No  . Sexual activity: Never    Birth control/protection: Post-menopausal  Lifestyle  . Physical activity:    Days per week: Not on file    Minutes per session: Not on file  . Stress: Not on file  Relationships  . Social connections:    Talks on phone: Not on file    Gets together: Not on file    Attends religious service: Not on file    Active member of club or organization: Not on file    Attends meetings of clubs or organizations: Not on file    Relationship status: Not on file  Other Topics Concern  . Not on file  Social History Narrative  . Not on file    Review of Systems: A  12 point ROS discussed and pertinent positives are indicated in the HPI above.  All other systems are negative.  Review of Systems  Constitutional: Positive for activity change. Negative for fatigue, fever and unexpected weight change.  HENT: Negative for tinnitus and trouble swallowing.   Eyes: Negative for visual disturbance.  Respiratory: Negative for shortness of breath.   Cardiovascular: Negative for chest pain.  Gastrointestinal: Negative for abdominal pain.  Musculoskeletal: Negative for back pain and gait problem.  Neurological: Positive for speech difficulty and headaches. Negative for dizziness, tremors, seizures, syncope, facial asymmetry, weakness, light-headedness and numbness.  Psychiatric/Behavioral: Positive for confusion. Negative for behavioral problems.    Vital Signs: BP (!) 147/76 (BP Location: Left Arm)  Pulse 71   Temp 99.2 F (37.3 C) (Oral)   Resp 20   Ht 5\' 2"  (1.575 m)   Wt 152 lb 8.9 oz (69.2 kg)   SpO2 97%   BMI 27.90 kg/m   Physical Exam  Constitutional: She is oriented to person, place, and time. She appears well-nourished.  HENT:  Head: Atraumatic.  Eyes: EOM are normal.  Neck: Neck supple.  Cardiovascular: Normal rate, regular rhythm and normal heart sounds.  Pulmonary/Chest: Effort normal and breath sounds normal.  Abdominal: Soft. Bowel sounds are normal.  Musculoskeletal: Normal range of motion.  Neurological: She is alert and oriented to person, place, and time.  Skin: Skin is warm and dry.  Psychiatric: She has a normal mood and affect. Her behavior is normal.  Pleasant Confused; but able to converse  Nursing note and vitals reviewed.   Imaging: Dg Chest 2 View  Result Date: 06/10/2018 CLINICAL DATA:  Altered mental status. EXAM: CHEST - 2 VIEW COMPARISON:  05/11/2014 FINDINGS: Slightly decreased lung volumes. Vascular crowding in the hilar regions. No focal airspace disease or pulmonary edema. Heart size is within normal limits.  No large pleural effusions. IMPRESSION: Low lung volumes without focal chest disease. Electronically Signed   By: Markus Daft M.D.   On: 06/10/2018 21:14   Ct Head Wo Contrast  Result Date: 06/10/2018 CLINICAL DATA:  Altered LOC EXAM: CT HEAD WITHOUT CONTRAST TECHNIQUE: Contiguous axial images were obtained from the base of the skull through the vertex without intravenous contrast. COMPARISON:  None. FINDINGS: Brain: No acute territorial infarction, hemorrhage or intracranial mass. Moderate atrophy. Mild to moderate small vessel ischemic changes of the white matter. Nonenlarged ventricles. Old appearing lacunar infarcts in the basal ganglia. Vascular: No hyperdense vessels. Vertebral artery and carotid vascular calcification Skull: Normal. Negative for fracture or focal lesion. Sinuses/Orbits: No acute finding. Other: None IMPRESSION: 1. No CT evidence for acute intracranial abnormality. 2. Atrophy and small vessel ischemic changes of the white matter. Electronically Signed   By: Donavan Foil M.D.   On: 06/10/2018 01:28   Mr Jodene Nam Head Wo Contrast  Result Date: 06/10/2018 CLINICAL DATA:  Initial evaluation for acute stroke.  Dysarthria. EXAM: MRI HEAD WITHOUT CONTRAST MRA HEAD WITHOUT CONTRAST TECHNIQUE: Multiplanar, multiecho pulse sequences of the brain and surrounding structures were obtained without intravenous contrast. Angiographic images of the head were obtained using MRA technique without contrast. COMPARISON:  Prior CT from earlier the same day. FINDINGS: MRI HEAD FINDINGS Brain: Examination moderately degraded by motion artifact. Generalized cerebral atrophy. Advanced chronic microvascular ischemic disease seen involving the supratentorial cerebral white matter. Prominent perivascular spaces present within the bilateral basal ganglia extending into the midbrain/pons. Few small remote lacunar infarct present at the right thalamus/right cerebral peduncle. No abnormal foci of restricted diffusion to  suggest acute or subacute ischemia. Gray-white matter differentiation maintained. No evidence for acute intracranial hemorrhage. Few small chronic micro hemorrhages noted within the right thalamus. No mass lesion, midline shift or mass effect. No hydrocephalus. No extra-axial fluid collection. Pituitary gland grossly unremarkable. Vascular: Right vertebral artery likely hypoplastic and not well visualized. Major intracranial vascular flow voids otherwise maintained. Intracranial circulation somewhat dolichoectatic in appearance. Skull and upper cervical spine: Craniocervical junction unremarkable. Bone marrow signal intensity within normal limits. No scalp soft tissue abnormality. Sinuses/Orbits: Globes and orbital soft tissues demonstrate no acute finding. Patient status post ocular lens replacement bilaterally. Paranasal sinuses are largely clear. Trace opacity right mastoid air cells, of doubtful significance. Other: None. MRA  HEAD FINDINGS ANTERIOR CIRCULATION: Examination markedly degraded by motion artifact. Distal cervical segments of the internal carotid arteries are patent with antegrade flow. There is an apparent short-segment severe stenosis at the right cervical petrous junction (series 10, image 51). Petrous segments patent bilaterally. Cavernous and supraclinoid segments patent without appreciable stenosis. Origin of the ophthalmic arteries patent. ICA termini widely patent. A1 segments patent bilaterally. Left A1 appears to be partially duplicated. Grossly normal anterior communicating artery. Anterior cerebral arteries patent to their distal aspects without obvious stenosis. M1 segments patent without stenosis. Grossly normal MCA bifurcations. Probable short-segment mild to moderate proximal left M2 stenosis, superior division (series 1034, image 1). Distal MCA branches perfused and fairly symmetric POSTERIOR CIRCULATION: Dominant left vertebral artery patent to the vertebrobasilar junction without  stenosis. Partially visualized left PICA patent. Hypoplastic right vertebral artery visualized within the distal neck, but not visualized at the level of the foramen magnum, and may be occluded. Minimal flow within the distal right V4 segment at the vertebrobasilar junction, which may in part be due to collateralization. Right PICA faintly visualized. Basilar patent to its distal aspect without stenosis. Superior cerebral arteries patent bilaterally. Right PCA supplied via the basilar. Left PCA supplied via the basilar as well as a prominent posterior communicating artery. PCAs patent to their distal aspects without appreciable flow-limiting stenosis. Evaluation for aneurysm somewhat limited on this motion degraded exam. No obvious aneurysm identified. Irregular blush of contrast seen adjacent to the cavernous ICAs favored to be artifactual due to motion (series 10, image 98). IMPRESSION: MRI HEAD IMPRESSION: 1. Moderately motion degraded examination. 2. No acute intracranial infarct or other abnormality identified. 3. Age-related cerebral atrophy with moderate chronic small vessel ischemic disease. MRA HEAD IMPRESSION: 1. Moderately motion degraded examination. 2. Short-segment severe stenosis at the right cervical-petrous junction. Otherwise widely patent anterior circulation. 3. Nonvisualization of the hypoplastic right vertebral artery at the skull base, which may be occluded. Dominant left vertebral artery widely patent as is the remainder of the vertebrobasilar circulation. Electronically Signed   By: Jeannine Boga M.D.   On: 06/10/2018 07:31   Mr Brain Wo Contrast  Result Date: 06/10/2018 CLINICAL DATA:  Initial evaluation for acute stroke.  Dysarthria. EXAM: MRI HEAD WITHOUT CONTRAST MRA HEAD WITHOUT CONTRAST TECHNIQUE: Multiplanar, multiecho pulse sequences of the brain and surrounding structures were obtained without intravenous contrast. Angiographic images of the head were obtained using MRA  technique without contrast. COMPARISON:  Prior CT from earlier the same day. FINDINGS: MRI HEAD FINDINGS Brain: Examination moderately degraded by motion artifact. Generalized cerebral atrophy. Advanced chronic microvascular ischemic disease seen involving the supratentorial cerebral white matter. Prominent perivascular spaces present within the bilateral basal ganglia extending into the midbrain/pons. Few small remote lacunar infarct present at the right thalamus/right cerebral peduncle. No abnormal foci of restricted diffusion to suggest acute or subacute ischemia. Gray-white matter differentiation maintained. No evidence for acute intracranial hemorrhage. Few small chronic micro hemorrhages noted within the right thalamus. No mass lesion, midline shift or mass effect. No hydrocephalus. No extra-axial fluid collection. Pituitary gland grossly unremarkable. Vascular: Right vertebral artery likely hypoplastic and not well visualized. Major intracranial vascular flow voids otherwise maintained. Intracranial circulation somewhat dolichoectatic in appearance. Skull and upper cervical spine: Craniocervical junction unremarkable. Bone marrow signal intensity within normal limits. No scalp soft tissue abnormality. Sinuses/Orbits: Globes and orbital soft tissues demonstrate no acute finding. Patient status post ocular lens replacement bilaterally. Paranasal sinuses are largely clear. Trace opacity right mastoid air cells, of doubtful  significance. Other: None. MRA HEAD FINDINGS ANTERIOR CIRCULATION: Examination markedly degraded by motion artifact. Distal cervical segments of the internal carotid arteries are patent with antegrade flow. There is an apparent short-segment severe stenosis at the right cervical petrous junction (series 10, image 51). Petrous segments patent bilaterally. Cavernous and supraclinoid segments patent without appreciable stenosis. Origin of the ophthalmic arteries patent. ICA termini widely patent.  A1 segments patent bilaterally. Left A1 appears to be partially duplicated. Grossly normal anterior communicating artery. Anterior cerebral arteries patent to their distal aspects without obvious stenosis. M1 segments patent without stenosis. Grossly normal MCA bifurcations. Probable short-segment mild to moderate proximal left M2 stenosis, superior division (series 1034, image 1). Distal MCA branches perfused and fairly symmetric POSTERIOR CIRCULATION: Dominant left vertebral artery patent to the vertebrobasilar junction without stenosis. Partially visualized left PICA patent. Hypoplastic right vertebral artery visualized within the distal neck, but not visualized at the level of the foramen magnum, and may be occluded. Minimal flow within the distal right V4 segment at the vertebrobasilar junction, which may in part be due to collateralization. Right PICA faintly visualized. Basilar patent to its distal aspect without stenosis. Superior cerebral arteries patent bilaterally. Right PCA supplied via the basilar. Left PCA supplied via the basilar as well as a prominent posterior communicating artery. PCAs patent to their distal aspects without appreciable flow-limiting stenosis. Evaluation for aneurysm somewhat limited on this motion degraded exam. No obvious aneurysm identified. Irregular blush of contrast seen adjacent to the cavernous ICAs favored to be artifactual due to motion (series 10, image 98). IMPRESSION: MRI HEAD IMPRESSION: 1. Moderately motion degraded examination. 2. No acute intracranial infarct or other abnormality identified. 3. Age-related cerebral atrophy with moderate chronic small vessel ischemic disease. MRA HEAD IMPRESSION: 1. Moderately motion degraded examination. 2. Short-segment severe stenosis at the right cervical-petrous junction. Otherwise widely patent anterior circulation. 3. Nonvisualization of the hypoplastic right vertebral artery at the skull base, which may be occluded. Dominant  left vertebral artery widely patent as is the remainder of the vertebrobasilar circulation. Electronically Signed   By: Jeannine Boga M.D.   On: 06/10/2018 07:31    Labs:  CBC: Recent Labs    05/29/18 0859 06/10/18 0026 06/10/18 0033 06/10/18 1551 06/10/18 1658 06/11/18 0617  WBC 7.4 8.6  --  8.9  --  8.2  HGB 11.9* 11.2* 11.9* 11.4* 11.6* 10.4*  HCT 38.9 35.6* 35.0* 36.3 34.0* 32.2*  PLT 273 238  --  258  --  208    COAGS: Recent Labs    06/10/18 0026  INR 1.00  APTT 28    BMP: Recent Labs    06/10/18 0026 06/10/18 0033 06/10/18 1658 06/11/18 0617  NA 134* 133* 131* 135  K 3.8 3.7 3.9 3.9  CL 94* 93* 90* 99  CO2 28  --   --  27  GLUCOSE 148* 144* 141* 126*  BUN 18 20 16 12   CALCIUM 9.2  --   --  8.3*  CREATININE 1.50* 1.50* 1.40* 1.25*  GFRNONAA 31*  --   --  38*  GFRAA 36*  --   --  44*    LIVER FUNCTION TESTS: Recent Labs    06/10/18 0026 06/10/18 2204  BILITOT 0.6 0.8  AST 17 16  ALT 16 13  ALKPHOS 40 31*  PROT 6.8 4.9*  ALBUMIN 3.8 2.7*    TUMOR MARKERS: No results for input(s): AFPTM, CEA, CA199, CHROMGRNA in the last 8760 hours.  Assessment and Plan:  Confusion  HTN Abnormal MRA Cerebral artery stenosis Tentatively scheduled for cerebral arteriogram 7/26---family is discussing with pt to decide Will check with them in am  Thank you for this interesting consult.  I greatly enjoyed meeting Alisha [name] and look forward to participating in their care.  A copy of this report was sent to the requesting provider on this date.  Electronically Signed: Lavonia Drafts, PA-C 06/11/2018, 3:30 PM   I spent a total of 40 Minutes    in face to face in clinical consultation, greater than 50% of which was counseling/coordinating care for cerebral arteriogram

## 2018-06-11 NOTE — Progress Notes (Signed)
PROGRESS NOTE  ZYLA DASCENZO XTK:[id number] DOB: [birth year] DOA: 06/10/2018 PCP: Kelton Pillar, MD  HPI/Recap of past 24 hours:  Speech remain slurred Son at bedside Reports she has been c/o headache and dizziness at home Symptom started after one of her close friend had heart issues  Assessment/Plan: Principal Problem:   Acute metabolic encephalopathy Active Problems:   Hypertension   Confusion  Acute encephalopathy with slurred speech: -possibe hypertensive encephalopathy -eeg unremarkable, UA unremarkable, ammonia unremarkable -mri no acute stroke, does has Age-related cerebral atrophy with moderate chronic small vessel ischemic disease -mra+ Short-segment severe stenosis at the right cervical-petrous.Otherwise widely patent anterior circulation.  Nonvisualization of the hypoplastic right vertebral artery at the skull base, which may be occluded. Dominant left vertebral artery widely patent as is the remainder of the vertebrobasilar circulation. -case discussed with neurology and interventional radiology, plan to possible cerebral arteriogram on 7/26 ( AVOID ANTICOAGULATION, BUT ASA IS OK)   HTN; bp control  noninsulin dependent dm2,  a1c pending Hold home meds metformin,  Anemia of chronic disease:  CKD III Cr close to baseline.  H/o breast cancer s/p left mastectomy  She is on chronic iron and b12 supplement  Code Status: full  Family Communication: patient , son at bedside  Disposition Plan: not ready to discharge   Consultants:  Case discussed with neurology Dr Aroor  Case discussed with interventional radiology Dr Clinton Sawyer  Procedures:  EEG  Antibiotics:  none   Objective: BP (!) 108/49 (BP Location: Left Arm)   Pulse 89   Temp 97.7 F (36.5 C) (Oral)   Resp 18   Ht 5\' 2"  (1.575 m)   Wt 69.2 kg (152 lb 8.9 oz)   SpO2 100%   BMI 27.90 kg/m   Intake/Output Summary (Last 24 hours) at 06/11/2018 1119 Last data filed at 06/11/2018  1015 Gross per 24 hour  Intake 2978.05 ml  Output 300 ml  Net 2678.05 ml   Filed Weights   06/11/18 0426  Weight: 69.2 kg (152 lb 8.9 oz)    Exam: Patient is examined daily including today on 06/11/2018, exams remain the same as of yesterday except that has changed    General:  NAD, slurred speech  Cardiovascular: RRR  Respiratory: CTABL  Abdomen: Soft/ND/NT, positive BS  Musculoskeletal: No Edema  Neuro: alert, oriented to person  Data Reviewed: Basic Metabolic Panel: Recent Labs  Lab 06/10/18 0026 06/10/18 0033 06/10/18 1658 06/11/18 0617  NA 134* 133* 131* 135  K 3.8 3.7 3.9 3.9  CL 94* 93* 90* 99  CO2 28  --   --  27  GLUCOSE 148* 144* 141* 126*  BUN 18 20 16 12   CREATININE 1.50* 1.50* 1.40* 1.25*  CALCIUM 9.2  --   --  8.3*   Liver Function Tests: Recent Labs  Lab 06/10/18 0026 06/10/18 2204  AST 17 16  ALT 16 13  ALKPHOS 40 31*  BILITOT 0.6 0.8  PROT 6.8 4.9*  ALBUMIN 3.8 2.7*   No results for input(s): LIPASE, AMYLASE in the last 168 hours. Recent Labs  Lab 06/10/18 2133  AMMONIA 12   CBC: Recent Labs  Lab 06/10/18 0026 06/10/18 0033 06/10/18 1551 06/10/18 1658 06/11/18 0617  WBC 8.6  --  8.9  --  8.2  NEUTROABS 6.2  --  7.1  --   --   HGB 11.2* 11.9* 11.4* 11.6* 10.4*  HCT 35.6* 35.0* 36.3 34.0* 32.2*  MCV 87.7  --  87.3  --  87.3  PLT 238  --  258  --  208   Cardiac Enzymes:   No results for input(s): CKTOTAL, CKMB, CKMBINDEX, TROPONINI in the last 168 hours. BNP (last 3 results) No results for input(s): BNP in the last 8760 hours.  ProBNP (last 3 results) No results for input(s): PROBNP in the last 8760 hours.  CBG: Recent Labs  Lab 06/10/18 0155 06/10/18 1329 06/11/18 0758  GLUCAP 129* 131* 110*    No results found for this or any previous visit (from the past 240 hour(s)).   Studies: Dg Chest 2 View  Result Date: 06/10/2018 CLINICAL DATA:  Altered mental status. EXAM: CHEST - 2 VIEW COMPARISON:  05/11/2014  FINDINGS: Slightly decreased lung volumes. Vascular crowding in the hilar regions. No focal airspace disease or pulmonary edema. Heart size is within normal limits. No large pleural effusions. IMPRESSION: Low lung volumes without focal chest disease. Electronically Signed   By: Markus Daft M.D.   On: 06/10/2018 21:14    Scheduled Meds: . aspirin EC  81 mg Oral Daily  . calcium-vitamin D  1 tablet Oral q morning - 10a  . ezetimibe  10 mg Oral QAC breakfast  . ferrous sulfate  325 mg Oral BID WC  . glipiZIDE  5 mg Oral QAC breakfast  . losartan  100 mg Oral Daily   And  . hydrochlorothiazide  25 mg Oral Daily  . levothyroxine  112 mcg Oral QAC breakfast  . metoprolol succinate  100 mg Oral QAC breakfast  . multivitamin with minerals  1 tablet Oral q morning - 10a    Continuous Infusions: . sodium chloride 1,000 mL (06/11/18 1015)     Time spent: 65mins, case discussed with neurology and interventional radiology  I have personally reviewed and interpreted on  06/11/2018 daily labs, tele strips, imagings as discussed above under date review session and assessment and plans.  I reviewed all nursing notes, pharmacy notes, consultant notes,  vitals, pertinent old records  I have discussed plan of care as described above with RN , patient and family on 06/11/2018   Florencia Reasons MD, PhD  [facility] Pager [phone]. If 7PM-7AM, please contact night-coverage at [email or web address], password [facility] 06/11/2018, 11:19 AM  LOS: 0 days

## 2018-06-12 ENCOUNTER — Other Ambulatory Visit: Payer: Self-pay

## 2018-06-12 LAB — BASIC METABOLIC PANEL
Anion gap: 8 (ref 5–15)
BUN: 12 mg/dL (ref 8–23)
CALCIUM: 8.3 mg/dL — AB (ref 8.9–10.3)
CO2: 26 mmol/L (ref 22–32)
Chloride: 101 mmol/L (ref 98–111)
Creatinine, Ser: 1.27 mg/dL — ABNORMAL HIGH (ref 0.44–1.00)
GFR calc Af Amer: 44 mL/min — ABNORMAL LOW (ref >60)
GFR, EST NON AFRICAN AMERICAN: 38 mL/min — AB (ref >60)
GLUCOSE: 91 mg/dL (ref 70–99)
Potassium: 3.6 mmol/L (ref 3.5–5.1)
Sodium: 135 mmol/L (ref 135–145)

## 2018-06-12 LAB — HEMOGLOBIN A1C
HEMOGLOBIN A1C: 6.2 % — AB (ref 4.8–5.6)
Mean Plasma Glucose: 131.24 mg/dL

## 2018-06-12 LAB — CBC
HCT: 32.2 % — ABNORMAL LOW (ref 36.0–46.0)
Hemoglobin: 10.3 g/dL — ABNORMAL LOW (ref 12.0–15.0)
MCH: 27.8 pg (ref 26.0–34.0)
MCHC: 32 g/dL (ref 30.0–36.0)
MCV: 87 fL (ref 78.0–100.0)
PLATELETS: 200 10*3/uL (ref 150–400)
RBC: 3.7 MIL/uL — ABNORMAL LOW (ref 3.87–5.11)
RDW: 14.3 % (ref 11.5–15.5)
WBC: 8 10*3/uL (ref 4.0–10.5)

## 2018-06-12 LAB — GLUCOSE, CAPILLARY: Glucose-Capillary: 139 mg/dL — ABNORMAL HIGH (ref 70–99)

## 2018-06-12 LAB — VITAMIN B12: VITAMIN B 12: 1391 pg/mL — AB (ref 180–914)

## 2018-06-12 LAB — MAGNESIUM: MAGNESIUM: 1.5 mg/dL — AB (ref 1.7–2.4)

## 2018-06-12 MED ORDER — POTASSIUM CHLORIDE CRYS ER 20 MEQ PO TBCR
40.0000 meq | EXTENDED_RELEASE_TABLET | Freq: Once | ORAL | Status: AC
  Administered 2018-06-12: 40 meq via ORAL
  Filled 2018-06-12: qty 2

## 2018-06-12 MED ORDER — MAGNESIUM SULFATE IN D5W 1-5 GM/100ML-% IV SOLN
1.0000 g | Freq: Once | INTRAVENOUS | Status: AC
  Administered 2018-06-12: 1 g via INTRAVENOUS
  Filled 2018-06-12: qty 100

## 2018-06-12 MED ORDER — FERROUS SULFATE 325 (65 FE) MG PO TABS
325.0000 mg | ORAL_TABLET | Freq: Every day | ORAL | Status: DC
  Administered 2018-06-13 – 2018-06-16 (×4): 325 mg via ORAL
  Filled 2018-06-12 (×4): qty 1

## 2018-06-12 NOTE — Evaluation (Signed)
Physical Therapy Evaluation Patient Details Name: Alisha [name] MRN: [id number] DOB: 02/21/1932 Today's Date: 06/12/2018   History of Present Illness  Alisha [name] is a 84 y.o. female brought in by family for the second time in the last 24 hours for confusion, admitted 06/10/18; PMH: anemia, hypertension, diabetes;  Patient at baseline is normal lives alone and highly functional; MRI, head CT and EEG all normal; pt is scheduled for cerebral angiogram on 7/26  Clinical Impression  Pt admitted with above diagnosis. Pt currently with functional limitations due to the deficits listed below (see PT Problem List). Per pt son she is quite independent at her baseline, today she is mildly confused, with decr balance and decr tolerance to activity and will benefit from PT in acute setting; If cognition clears pt may be able to d/c home with HHPT, may need SNF if she progresses slowly;  Will continue to follow;      Follow Up Recommendations Home health PT;Supervision for mobility/OOB(vs SNF pending progress)    Equipment Recommendations  Other (comment)(TBD)    Recommendations for Other Services   OT consult    Precautions / Restrictions        Mobility  Bed Mobility Overal bed mobility: Needs Assistance Bed Mobility: Supine to Sit     Supine to sit: Min assist     General bed mobility comments: min assist to elevate trunk  Transfers Overall transfer level: Needs assistance Equipment used: Rolling walker (2 wheeled) Transfers: Sit to/from [facility] Sit to Stand: Min assist Stand pivot transfers: Min assist       General transfer comment: multi-modal cues for safety, sequencing, hand placement; pt requires assist to stabilize upon rising and transitioning to RW, min assist to perform stand pivot with initial lateral LOB  Ambulation/Gait Ambulation/Gait assistance: Min assist Gait Distance (Feet): 15 Feet(in room) Assistive device: Rolling walker (2  wheeled) Gait Pattern/deviations: Step-through pattern;Decreased stride length     General Gait Details: assist to balance and maneuver RW in smaller spaces of room, LOB x 1 with min assist to recover   Stairs            Wheelchair Mobility    Modified Rankin (Stroke Patients Only)       Balance Overall balance assessment: Needs assistance Sitting-balance support: Feet supported;No upper extremity supported Sitting balance-Leahy Scale: Fair(at least Fair)     Standing balance support: During functional activity;Single extremity supported;Bilateral upper extremity supported Standing balance-Leahy Scale: Poor Standing balance comment: reliant on UE support                             Pertinent Vitals/Pain Pain Assessment: Faces Faces Pain Scale: Hurts a little bit Pain Location: L side(pt son reports RN is aware) Pain Descriptors / Indicators: Sore Pain Intervention(s): Monitored during session;Repositioned    Home Living Family/patient expects to be discharged to:: Private residence Living Arrangements: Alone   Type of Home: House Home Access: Ramped entrance     Home Layout: One level Home Equipment: Environmental consultant - 2 wheels;Cane - single point;Wheelchair - manual Additional Comments: son gives most of info as pt is unreliable historian at this time    Prior Function Level of Independence: Independent with assistive device(s)         Comments: amb with cane     Hand Dominance        Extremity/Trunk Assessment   Upper Extremity Assessment Upper Extremity Assessment: Overall  WFL for tasks assessed    Lower Extremity Assessment Lower Extremity Assessment: Overall WFL for tasks assessed       Communication   Communication: No difficulties  Cognition   Behavior During Therapy: WFL for tasks assessed/performed Overall Cognitive Status: Impaired/Different from baseline Area of Impairment: Orientation;Following commands;Memory;Problem  solving                 Orientation Level: Disoriented to;Situation;Time   Memory: Decreased short-term memory Following Commands: Follows one step commands consistently;Follows multi-step commands inconsistently     Problem Solving: Difficulty sequencing;Requires verbal cues;Requires tactile cues General Comments: pt repeats self, repeats same questions (that were justa nswered); she is fairly conversive but has difficulty sequencing functional tasks--(she attempts to perform pericare without  paper until verbally directed with step by step cues; )      General Comments      Exercises     Assessment/Plan    PT Assessment Patient needs continued PT services  PT Problem List Decreased activity tolerance;Decreased balance;Decreased knowledge of use of DME;Decreased safety awareness;Decreased mobility;Decreased cognition       PT Treatment Interventions DME instruction;Gait training;Functional mobility training;Therapeutic activities;Therapeutic exercise;Patient/family education;Balance training    PT Goals (Current goals can be found in the Care Plan section)  Acute Rehab PT Goals Patient Stated Goal: to find out why her head feels funny PT Goal Formulation: With patient Time For Goal Achievement: 06/26/18 Potential to Achieve Goals: Good    Frequency Min 3X/week   Barriers to discharge        Co-evaluation               AM-PAC PT "6 Clicks" Daily Activity  Outcome Measure Difficulty turning over in bed (including adjusting bedclothes, sheets and blankets)?: A Lot Difficulty moving from lying on back to sitting on the side of the bed? : Unable Difficulty sitting down on and standing up from a chair with arms (e.g., wheelchair, bedside commode, etc,.)?: Unable Help needed moving to and from a bed to chair (including a wheelchair)?: A Little Help needed walking in hospital room?: A Little Help needed climbing 3-5 steps with a railing? : A Lot 6 Click Score:  12    End of Session Equipment Utilized During Treatment: Gait belt Activity Tolerance: Patient tolerated treatment well Patient left: with call bell/phone within reach;in chair;with chair alarm set   PT Visit Diagnosis: Difficulty in walking, not elsewhere classified (R26.2);Unsteadiness on feet (R26.81)    Time: 6503-5465 PT Time Calculation (min) (ACUTE ONLY): 18 min   Charges:   PT Evaluation $PT Eval Low Complexity: 1 Low          Kenyon Ana, PT Pager: [phone] 06/12/2018   [facility] 06/12/2018, 4:26 PM

## 2018-06-12 NOTE — Progress Notes (Addendum)
Subjective: Patient continues to be confused this a.m., but does answer questions appropriately without slurred speech or dysarthria.  She does move all extremities equally and repositions herself in bed.  No focal neuro deficits noted  Exam: Vitals:   06/11/18 2206 06/12/18 0636  BP: (!) 143/62 (!) 154/76  Pulse: 62 (!) 53  Resp: 18 19  Temp: 98.4 F (36.9 C) 97.7 F (36.5 C)  SpO2: 98% 98%    Physical Exam  HEENT-  Normocephalic, no lesions, without obvious abnormality.  Normal external eye and conjunctiva.   Cardiovascular- S1-S2 audible, pulses palpable throughout   Lungs-no rhonchi or wheezing noted, no excessive working breathing.  Saturations within normal limits Abdomen- All 4 quadrants palpated and nontender Musculoskeletal-no joint tenderness, deformity or swelling Skin-warm and dry, no hyperpigmentation, vitiligo, or suspicious lesions  Neuro:  Mental Status: Alert, oriented, with mild confusion, does not know month or year. speech fluent without evidence of aphasia.  Able to follow commands with some direction.. Naming, repetition and comprehension intact Cranial Nerves: II:  Visual fields grossly normal,  III,IV, VI: ptosis not present, extra-ocular motions intact bilaterally pupils equal, round, reactive to light and accommodation V,VII: smile symmetric, facial light touch sensation normal bilaterally VIII: hearing intact to voice IX,X: uvula rises symmetrically XI: bilateral shoulder shrug XII: midline tongue extension Motor: Moves all extremities equally Right : Upper extremity   5/5    Left:     Upper extremity   5/5  Lower extremity   5/5     Lower extremity   5/5 Tone and bulk:normal tone throughout; no atrophy noted Sensory: Sensation intact bilaterally  Deep Tendon Reflexes: 2+ and symmetric throughout Plantars: Right: downgoing   Left: downgoing Cerebellar: normal finger-to-nose Gait: Not assessed    Medications:  . aspirin EC  81 mg Oral Daily  .  calcium-vitamin D  1 tablet Oral q morning - 10a  . ezetimibe  10 mg Oral QAC breakfast  . [START ON 06/13/2018] ferrous sulfate  325 mg Oral Q breakfast  . glipiZIDE  5 mg Oral QAC breakfast  . losartan  100 mg Oral Daily   And  . hydrochlorothiazide  25 mg Oral Daily  . levothyroxine  112 mcg Oral QAC breakfast  . metoprolol succinate  100 mg Oral QAC breakfast  . multivitamin with minerals  1 tablet Oral q morning - 10a    Pertinent Labs/Diagnostics:  Assessment: Alisha [name] is a 84 y.o. femalewith a history of significant hypertension, diabetes who was brought to the hospital by family secondary to noting that she had been more confused than usual or over the past 2 days.  Lately she lives alone and at baseline she is highly functional.  Of note over the past 48 hours prior to admission, she was non compliant with her BP meds.  Other work-up for confusion so far negative-EEG negative, B12 is high.  MR brain negative for stroke.   1.  Possible hypertensive encephalopathy-normal CT head and MRI brain, MRA head showed severe stenosis of the right cervical petrous junction.  Recommend maintaining normotensive blood pressure.  EEG did not show any epileptiform activity.  Will defer starting antiepileptics at this time  2. Delirium with possible underlying mild cognitive impairment   3.  Right intracranial stenosis-likely chronic,   Interventional neuro -radiology was consulted and recommended cerebral angiogram.  It appears family has consented.  Recommendations: 1) gradual reduction in blood pressure 2) follow B1 levels, continue multivitamins.  Alisha Cape DNP  Neuro-hospitalist Team [phone] 06/12/2018, 1:16 PM    NEUROHOSPITALIST ADDENDUM Seen and examined the patient today. I have reviewed the contents of history and physical exam as documented by PA/ARNP/Resident and agree with above documentation.  I have discussed and formulated the above plan as documented. Edits to  the note have been made as needed.    Alisha Addison Niquita Digioia MD [facility] [phone]   If 7pm to 7am, please call on call as listed on AMION.

## 2018-06-12 NOTE — Progress Notes (Addendum)
Patient ID: Alisha [name], female   DOB: 1932-04-02, 84 y.o.   MRN: [id number]    Pts son agreeable to move ahead with cerebral arteriogram  Scheduled for 7/26 Consent signed and in chart  Risks and benefits of cerebral angiogram with intervention were discussed with the patient's son including, but not limited to bleeding, infection, vascular injury, contrast induced renal failure, stroke or even death.  This interventional procedure involves the use of X-rays and because of the nature of the planned procedure, it is possible that we will have prolonged use of X-ray fluoroscopy.  Potential radiation risks to you include (but are not limited to) the following: - A slightly elevated risk for cancer  several years later in life. This risk is typically less than 0.5% percent. This risk is low in comparison to the normal incidence of human cancer, which is 33% for women and 50% for men according to the [employer] [place]. - Radiation induced injury can include skin redness, resembling a rash, tissue breakdown / ulcers and hair loss (which can be temporary or permanent).   The likelihood of either of these occurring depends on the difficulty of the procedure and whether you are sensitive to radiation due to previous procedures, disease, or genetic conditions.   IF your procedure requires a prolonged use of radiation, you will be notified and given written instructions for further action.  It is your responsibility to monitor the irradiated area for the 2 weeks following the procedure and to notify your physician if you are concerned that you have suffered a radiation induced injury.    All of the sons questions were answered, he is agreeable to proceed.  Consent signed and in chart.

## 2018-06-12 NOTE — Progress Notes (Signed)
PROGRESS NOTE  Alisha [name] IHW:[id number] DOB: [birth year] DOA: 06/10/2018 PCP: Kelton Pillar, MD  HPI/Recap of past 24 hours:  Remain confused, Speech remain slurred Son at bedside Denies c/o headache and dizziness today, reports left sided rib pain   Assessment/Plan: Principal Problem:   Acute metabolic encephalopathy Active Problems:   Hypertension   Confusion  Acute encephalopathy with slurred speech: -possibe hypertensive encephalopathy, possible underline undetected vascular dementia -eeg unremarkable, UA unremarkable, ammonia unremarkable -mri no acute stroke, does has Age-related cerebral atrophy with moderate chronic small vessel ischemic disease -mra+ Short-segment severe stenosis at the right cervical-petrous.Otherwise widely patent anterior circulation.  Nonvisualization of the hypoplastic right vertebral artery at the skull base, which may be occluded. Dominant left vertebral artery widely patent as is the remainder of the vertebrobasilar circulation. -case discussed with neurology and interventional radiology, plan to possible cerebral arteriogram on 7/26 ( AVOID ANTICOAGULATION, BUT ASA IS OK)  Hypokalemia/hypomegnesemia: Replace k/mag   HTN; bp control  noninsulin dependent dm2,  a1c 6.2 Hold home meds metformin,  Anemia of chronic disease: hgb stable at 10  CKD III Cr close to baseline.  H/o breast cancer s/p left mastectomy  She is on chronic iron and b12 supplement  Code Status: full  Family Communication: patient , son at bedside  Disposition Plan: not ready to discharge, may need placement if no significant improvement, son agrees to placement if needed   Consultants:  Case discussed with neurology Dr Aroor  Case discussed with interventional radiology Dr Clinton Sawyer  Procedures:  EEG  Antibiotics:  none   Objective: BP 119/62 (BP Location: Left Arm)   Pulse 79   Temp 98.4 F (36.9 C) (Oral)   Resp 20   Ht 5\' 2"  (1.575  m)   Wt 69.2 kg (152 lb 8.9 oz)   SpO2 99%   BMI 27.90 kg/m   Intake/Output Summary (Last 24 hours) at 06/12/2018 1633 Last data filed at 06/12/2018 4917 Gross per 24 hour  Intake 2164.31 ml  Output -  Net 2164.31 ml   Filed Weights   06/11/18 0426  Weight: 69.2 kg (152 lb 8.9 oz)    Exam: Patient is examined daily including today on 06/12/2018, exams remain the same as of yesterday except that has changed    General:  NAD, slurred speech, confused  Cardiovascular: RRR  Respiratory: CTABL  Abdomen: Soft/ND/NT, positive BS  Musculoskeletal: No Edema  Neuro: alert, oriented to person only  Data Reviewed: Basic Metabolic Panel: Recent Labs  Lab 06/10/18 0026 06/10/18 0033 06/10/18 1658 06/11/18 0617 06/12/18 0508  NA 134* 133* 131* 135 135  K 3.8 3.7 3.9 3.9 3.6  CL 94* 93* 90* 99 101  CO2 28  --   --  27 26  GLUCOSE 148* 144* 141* 126* 91  BUN 18 20 16 12 12   CREATININE 1.50* 1.50* 1.40* 1.25* 1.27*  CALCIUM 9.2  --   --  8.3* 8.3*  MG  --   --   --   --  1.5*   Liver Function Tests: Recent Labs  Lab 06/10/18 0026 06/10/18 2204  AST 17 16  ALT 16 13  ALKPHOS 40 31*  BILITOT 0.6 0.8  PROT 6.8 4.9*  ALBUMIN 3.8 2.7*   No results for input(s): LIPASE, AMYLASE in the last 168 hours. Recent Labs  Lab 06/10/18 2133  AMMONIA 12   CBC: Recent Labs  Lab 06/10/18 0026 06/10/18 0033 06/10/18 1551 06/10/18 1658 06/11/18 0617 06/12/18 0508  WBC  8.6  --  8.9  --  8.2 8.0  NEUTROABS 6.2  --  7.1  --   --   --   HGB 11.2* 11.9* 11.4* 11.6* 10.4* 10.3*  HCT 35.6* 35.0* 36.3 34.0* 32.2* 32.2*  MCV 87.7  --  87.3  --  87.3 87.0  PLT 238  --  258  --  208 200   Cardiac Enzymes:   No results for input(s): CKTOTAL, CKMB, CKMBINDEX, TROPONINI in the last 168 hours. BNP (last 3 results) No results for input(s): BNP in the last 8760 hours.  ProBNP (last 3 results) No results for input(s): PROBNP in the last 8760 hours.  CBG: Recent Labs  Lab  06/10/18 0155 06/10/18 1329 06/11/18 0758 06/12/18 0810  GLUCAP 129* 131* 110* 139*    No results found for this or any previous visit (from the past 240 hour(s)).   Studies: No results found.  Scheduled Meds: . aspirin EC  81 mg Oral Daily  . calcium-vitamin D  1 tablet Oral q morning - 10a  . ezetimibe  10 mg Oral QAC breakfast  . [START ON 06/13/2018] ferrous sulfate  325 mg Oral Q breakfast  . glipiZIDE  5 mg Oral QAC breakfast  . losartan  100 mg Oral Daily   And  . hydrochlorothiazide  25 mg Oral Daily  . levothyroxine  112 mcg Oral QAC breakfast  . metoprolol succinate  100 mg Oral QAC breakfast  . multivitamin with minerals  1 tablet Oral q morning - 10a    Continuous Infusions: . sodium chloride 1,000 mL (06/12/18 0751)     Time spent: 30mins, case discussed with neurology   I have personally reviewed and interpreted on  06/12/2018 daily labs, tele strips, imagings as discussed above under date review session and assessment and plans.  I reviewed all nursing notes, pharmacy notes, consultant notes,  vitals, pertinent old records  I have discussed plan of care as described above with RN , patient and family on 06/12/2018   Florencia Reasons MD, PhD  [facility] Pager [phone]. If 7PM-7AM, please contact night-coverage at [email or web address], password [facility] 06/12/2018, 4:33 PM  LOS: 1 day

## 2018-06-13 ENCOUNTER — Inpatient Hospital Stay (HOSPITAL_COMMUNITY): Payer: Medicare Other

## 2018-06-13 HISTORY — PX: IR ANGIO VERTEBRAL SEL VERTEBRAL UNI L MOD SED: IMG5367

## 2018-06-13 HISTORY — PX: IR ANGIO EXTRACRAN SEL COM CAROTID INNOMINATE UNI BILAT MOD SED: IMG5357

## 2018-06-13 HISTORY — PX: IR ANGIO VERTEBRAL SEL SUBCLAVIAN INNOMINATE UNI R MOD SED: IMG5365

## 2018-06-13 LAB — GLUCOSE, CAPILLARY
GLUCOSE-CAPILLARY: 123 mg/dL — AB (ref 70–99)
GLUCOSE-CAPILLARY: 120 mg/dL — AB (ref 70–99)

## 2018-06-13 LAB — BASIC METABOLIC PANEL
ANION GAP: 9 (ref 5–15)
BUN: 13 mg/dL (ref 8–23)
CO2: 23 mmol/L (ref 22–32)
Calcium: 8.2 mg/dL — ABNORMAL LOW (ref 8.9–10.3)
Chloride: 100 mmol/L (ref 98–111)
Creatinine, Ser: 1.1 mg/dL — ABNORMAL HIGH (ref 0.44–1.00)
GFR calc Af Amer: 52 mL/min — ABNORMAL LOW (ref >60)
GFR calc non Af Amer: 45 mL/min — ABNORMAL LOW (ref >60)
Glucose, Bld: 113 mg/dL — ABNORMAL HIGH (ref 70–99)
POTASSIUM: 3.4 mmol/L — AB (ref 3.5–5.1)
Sodium: 132 mmol/L — ABNORMAL LOW (ref 135–145)

## 2018-06-13 LAB — CBC
HCT: 33.2 % — ABNORMAL LOW (ref 36.0–46.0)
Hemoglobin: 10.7 g/dL — ABNORMAL LOW (ref 12.0–15.0)
MCH: 27.6 pg (ref 26.0–34.0)
MCHC: 32.2 g/dL (ref 30.0–36.0)
MCV: 85.8 fL (ref 78.0–100.0)
Platelets: 237 10*3/uL (ref 150–400)
RBC: 3.87 MIL/uL (ref 3.87–5.11)
RDW: 14.4 % (ref 11.5–15.5)
WBC: 10.4 10*3/uL (ref 4.0–10.5)

## 2018-06-13 LAB — MAGNESIUM: Magnesium: 1.7 mg/dL (ref 1.7–2.4)

## 2018-06-13 MED ORDER — CARVEDILOL 12.5 MG PO TABS
12.5000 mg | ORAL_TABLET | Freq: Two times a day (BID) | ORAL | Status: DC
  Administered 2018-06-13 – 2018-06-15 (×4): 12.5 mg via ORAL
  Filled 2018-06-13 (×4): qty 1

## 2018-06-13 MED ORDER — LIDOCAINE HCL (PF) 1 % IJ SOLN
INTRAMUSCULAR | Status: AC | PRN
  Administered 2018-06-13: 10 mL

## 2018-06-13 MED ORDER — HEPARIN SODIUM (PORCINE) 1000 UNIT/ML IJ SOLN
INTRAMUSCULAR | Status: AC
  Filled 2018-06-13: qty 1

## 2018-06-13 MED ORDER — LIDOCAINE HCL 1 % IJ SOLN
INTRAMUSCULAR | Status: AC
  Filled 2018-06-13: qty 20

## 2018-06-13 MED ORDER — MIDAZOLAM HCL 2 MG/2ML IJ SOLN
INTRAMUSCULAR | Status: AC
  Filled 2018-06-13: qty 2

## 2018-06-13 MED ORDER — FENTANYL CITRATE (PF) 100 MCG/2ML IJ SOLN
INTRAMUSCULAR | Status: AC | PRN
  Administered 2018-06-13: 25 ug via INTRAVENOUS

## 2018-06-13 MED ORDER — SODIUM CHLORIDE 0.9 % IV SOLN: INTRAVENOUS | Status: AC

## 2018-06-13 MED ORDER — HEPARIN SODIUM (PORCINE) 1000 UNIT/ML IJ SOLN
INTRAMUSCULAR | Status: AC | PRN
  Administered 2018-06-13: 1000 [IU] via INTRAVENOUS

## 2018-06-13 MED ORDER — SODIUM CHLORIDE 0.9 % IV SOLN
INTRAVENOUS | Status: AC | PRN
  Administered 2018-06-13: 10 mL/h via INTRAVENOUS

## 2018-06-13 MED ORDER — IOHEXOL 300 MG/ML  SOLN: 60.0000 mL | Freq: Once | INTRAMUSCULAR | Status: DC | PRN

## 2018-06-13 MED ORDER — FENTANYL CITRATE (PF) 100 MCG/2ML IJ SOLN
INTRAMUSCULAR | Status: AC
  Filled 2018-06-13: qty 2

## 2018-06-13 MED ORDER — VITAMIN B-1 100 MG PO TABS
100.0000 mg | ORAL_TABLET | Freq: Every day | ORAL | Status: DC
  Administered 2018-06-13 – 2018-06-16 (×4): 100 mg via ORAL
  Filled 2018-06-13 (×4): qty 1

## 2018-06-13 MED ORDER — IOPAMIDOL (ISOVUE-300) INJECTION 61%
INTRAVENOUS | Status: AC
  Filled 2018-06-13: qty 50

## 2018-06-13 MED ORDER — POTASSIUM CHLORIDE CRYS ER 20 MEQ PO TBCR
40.0000 meq | EXTENDED_RELEASE_TABLET | Freq: Once | ORAL | Status: AC
  Administered 2018-06-13: 40 meq via ORAL
  Filled 2018-06-13: qty 2

## 2018-06-13 MED ORDER — MAGNESIUM SULFATE 2 GM/50ML IV SOLN
2.0000 g | Freq: Once | INTRAVENOUS | Status: AC
  Administered 2018-06-13: 2 g via INTRAVENOUS
  Filled 2018-06-13: qty 50

## 2018-06-13 NOTE — NC FL2 (Addendum)
[place] MEDICAID FL2 LEVEL OF CARE SCREENING TOOL     IDENTIFICATION  Patient Name: Alisha [name] Birthdate: Feb 19, 1932 Sex: female Admission Date (Current Location): 06/10/2018  [facility] and [place] Number:  Herbalist and Address:  [facility] [place], [place] [address] [place], [place] [address]      Provider Number: [id number]  Attending Physician Name and Address:  Florencia Reasons, MD  Relative Name and Phone Number:  Aaron Edelman, son, [phone]    Current Level of Care: Hospital Recommended Level of Care: [place] Prior Approval Number:    Date Approved/Denied:   PASRR Number:   [id number]  Discharge Plan: SNF    Current Diagnoses: Patient Active Problem List   Diagnosis Date Noted  . Acute metabolic encephalopathy 38/08/1750  . Confusion 06/10/2018  . Hypertension   . B12 deficiency   . Breast cancer ([place]) 05/12/2014  . Breast cancer of upper-outer quadrant of right female breast ([place]) 04/26/2014  . Hypothyroidism, postsurgical 02/04/2012  . Multinodular goiter (nontoxic), atypia on cytopathology 10/18/2011    Orientation RESPIRATION BLADDER Height & Weight     Self  Normal Continent Weight: 69.2 kg (152 lb 8.9 oz) Height:  5\' 2"  (157.5 cm)  BEHAVIORAL SYMPTOMS/MOOD NEUROLOGICAL BOWEL NUTRITION STATUS  (NA)   Continent Diet(Please see DC Summary)  AMBULATORY STATUS COMMUNICATION OF NEEDS Skin   Limited Assist Verbally Normal                       Personal Care Assistance Level of Assistance  Bathing, Feeding, Dressing Bathing Assistance: Limited assistance Feeding assistance: Limited assistance Dressing Assistance: Limited assistance     Functional Limitations Info  Sight, Hearing, Speech Sight Info: Adequate Hearing Info: Adequate Speech Info: Adequate    SPECIAL CARE FACTORS FREQUENCY  PT (By licensed PT), OT (By licensed OT)     PT Frequency: 5x/week OT Frequency: 3x/week             Contractures      Additional Factors Info  Code Status, Allergies Code Status Info: Full Allergies Info: Ace Inhibitors, Codeine, Evista Raloxifene Hydrochloride, Zocor Simvastatin - High Dose           Current Medications (06/13/2018):  This is the current hospital active medication list Current Facility-Administered Medications  Medication Dose Route Frequency Provider Last Rate Last Dose  . iopamidol (ISOVUE-300) 61 % injection           . lidocaine (XYLOCAINE) 1 % (with pres) injection           . 0.9 %  sodium chloride infusion   Intravenous Continuous Phillips Grout, MD 100 mL/hr at 06/13/18 0248    . aspirin EC tablet 81 mg  81 mg Oral Daily Derrill Kay A, MD   81 mg at 06/12/18 0933  . calcium-vitamin D (OSCAL WITH D) 500-200 MG-UNIT per tablet 1 tablet  1 tablet Oral q morning - 10a Phillips Grout, MD   1 tablet at 06/12/18 0933  . ezetimibe (ZETIA) tablet 10 mg  10 mg Oral QAC breakfast Phillips Grout, MD   10 mg at 06/12/18 0746  . ferrous sulfate tablet 325 mg  325 mg Oral Q breakfast Florencia Reasons, MD      . glipiZIDE (GLUCOTROL XL) 24 hr tablet 5 mg  5 mg Oral QAC breakfast Derrill Kay A, MD   5 mg at 06/12/18 0746  . hydrALAZINE (APRESOLINE) injection 20 mg  20 mg Intravenous Q4H PRN Phillips Grout, MD   20 mg at 06/13/18 0610  . losartan (COZAAR) tablet 100 mg  100 mg Oral Daily Derrill Kay A, MD   100 mg at 06/12/18 0933   And  . hydrochlorothiazide (HYDRODIURIL) tablet 25 mg  25 mg Oral Daily Phillips Grout, MD   25 mg at 06/12/18 0933  . levothyroxine (SYNTHROID, LEVOTHROID) tablet 112 mcg  112 mcg Oral QAC breakfast Phillips Grout, MD   112 mcg at 06/12/18 0746  . metoprolol succinate (TOPROL-XL) 24 hr tablet 100 mg  100 mg Oral QAC breakfast Derrill Kay A, MD   100 mg at 06/12/18 0746  . multivitamin with minerals tablet 1 tablet  1 tablet Oral q morning - 10a Phillips Grout, MD   1 tablet at 06/12/18 9093     Discharge Medications: Please see  discharge summary for a list of discharge medications.  Relevant Imaging Results:  Relevant Lab Results:   Additional Information SSN: [place], [place]

## 2018-06-13 NOTE — Progress Notes (Signed)
OT Cancellation Note  Patient Details Name: Alisha [name] MRN: [id number] DOB: October 24, 1932   Cancelled Treatment:    Reason Eval/Treat Not Completed: Patient at procedure or test/ unavailable(Pt in IR. Will follow.)  Malka So 06/13/2018, 11:07 AM  06/13/2018 Nestor Lewandowsky, OTR/L Pager: [phone]

## 2018-06-13 NOTE — Clinical Social Work Note (Signed)
Clinical Social Work Assessment  Patient Details  Name: Alisha [name] MRN: [id number] Date of Birth: [birth year]  Date of referral:  06/13/18               Reason for consult:  Facility Placement                Permission sought to share information with:  Facility Sport and exercise psychologist, Family Supports Permission granted to share information::  No  Name::     Proofreader::  SNFs  Relationship::  Son  Contact Information:  [phone]  Housing/Transportation Living arrangements for the past 2 months:  [place] of Information:  Adult Children Patient Interpreter Needed:  None Criminal Activity/Legal Involvement Pertinent to Current Situation/Hospitalization:  No - Comment as needed Significant Relationships:  Adult Children Lives with:  Self Do you feel safe going back to the place where you live?  No Need for family participation in patient care:  Yes (Comment)  Care giving concerns:  CSW received consult for possible SNF placement at time of discharge. Patient quiet. CSW spoke with patient's son and daughter in law at bedside regarding PT recommendation of SNF placement at time of discharge. Patient's son reported that patient is currently unable to care for herself at home given patient's current cognitive needs and fall risk. Patient's son expressed understanding of PT recommendation and is agreeable to SNF placement at time of discharge. CSW to continue to follow and assist with discharge planning needs.   Social Worker assessment / plan:  CSW spoke with patient's son concerning possibility of rehab at [facility] before returning home.  Employment status:  Retired Forensic scientist:  Information systems manager PT Recommendations:  [place], Home with [place] / Referral to community resources:  [place]  Patient/Family's Response to care:  Patient's son recognizes need for rehab before returning home and is agreeable to a SNF in  [place]. Patient reported preference for [place] since patient lives near there. CSW also answered questions about Assisted Living Placements. Patient's grandson works at an ALF.   Patient/Family's Understanding of and Emotional Response to Diagnosis, Current Treatment, and Prognosis:  Patient/family is realistic regarding therapy needs and expressed being hopeful for SNF placement. Patient's son expressed understanding of CSW role and discharge process as well as medical condition. No questions/concerns about plan or treatment.    Emotional Assessment Appearance:  Appears stated age Attitude/Demeanor/Rapport:  Unable to Assess Affect (typically observed):  Unable to Assess Orientation:  Oriented to Self Alcohol / Substance use:  Not Applicable Psych involvement (Current and /or in the community):  No (Comment)  Discharge Needs  Concerns to be addressed:  Care Coordination Readmission within the last 30 days:  No Current discharge risk:  None Barriers to Discharge:  Continued Medical Work up   [employer], [employer] 06/13/2018, 10:24 AM

## 2018-06-13 NOTE — Progress Notes (Signed)
PT Cancellation Note  Patient Details Name: Alisha [name] MRN: [id number] DOB: 03-08-1932   Cancelled Treatment:    Reason Eval/Treat Not Completed: Patient at procedure or test/unavailable Pt off unit for procedure. PT will continue to follow acutely.    [place] April, PTA Pager: [phone]   06/13/2018, 10:44 AM

## 2018-06-13 NOTE — Care Management Important Message (Signed)
Important Message  Patient Details  Name: Alisha [name] MRN: [id number] Date of Birth: Feb 01, 1932   Medicare Important Message Given:  Yes    Orbie Pyo 06/13/2018, 3:32 PM

## 2018-06-13 NOTE — Procedures (Signed)
S/P 4 vessel cerebral arteriogram RT CFA approach. Findings. 1No gross occlusions intracranially or extracranially. 2. Venous outflow WNLs

## 2018-06-13 NOTE — Progress Notes (Signed)
PROGRESS NOTE  Alisha [name] JAS:[id number] DOB: 04/14/1932 DOA: 06/10/2018 PCP: Kelton Pillar, MD  HPI/Recap of past 24 hours:  Remain confused, sitting up in chair Son at bedside    Assessment/Plan: Principal Problem:   Acute metabolic encephalopathy Active Problems:   Hypertension   Confusion  Acute encephalopathy with slurred speech: -possibe hypertensive encephalopathy, possible underline undetected vascular dementia -eeg unremarkable, UA unremarkable, ammonia unremarkable -mri no acute stroke, does has Age-related cerebral atrophy with moderate chronic small vessel ischemic disease -mra+ Short-segment severe stenosis at the right cervical-petrous.Otherwise widely patent anterior circulation.  Nonvisualization of the hypoplastic right vertebral artery at the skull base, which may be occluded. Dominant left vertebral artery widely patent as is the remainder of the vertebrobasilar circulation. -case discussed with neurology and interventional radiology, cerebral arteriogram on 7/26   Hypokalemia/hypomegnesemia: Remain low continue to Replace k/mag D/c hctz   HTN; bp control Remain elevated, d/c lopressor change to coreg, continue cozaar Continue adjust bp meds  noninsulin dependent dm2,  a1c 6.2 Hold home meds metformin,  Anemia of chronic disease: hgb stable at 10  CKD III Cr close to baseline.  H/o breast cancer s/p left mastectomy  She is on chronic iron and b12 supplement  Code Status: full  Family Communication: patient , son at bedside  Disposition Plan: SNF placement   Consultants:  Case discussed with neurology Dr Aroor  Case discussed with interventional radiology Dr Clinton Sawyer  Procedures:  EEG  Cerebral angiogram  Antibiotics:  none   Objective: BP (!) 169/81 (BP Location: Left Arm)   Pulse 82   Temp 97.9 F (36.6 C)   Resp 18   Ht 5\' 2"  (1.575 m)   Wt 69.2 kg (152 lb 8.9 oz)   SpO2 96%   BMI 27.90 kg/m    Intake/Output Summary (Last 24 hours) at 06/13/2018 1535 Last data filed at 06/13/2018 1100 Gross per 24 hour  Intake 1578.32 ml  Output -  Net 1578.32 ml   Filed Weights   06/11/18 0426  Weight: 69.2 kg (152 lb 8.9 oz)    Exam: Patient is examined daily including today on 06/13/2018, exams remain the same as of yesterday except that has changed    General:  NAD, slurred speech, confused  Cardiovascular: RRR  Respiratory: CTABL  Abdomen: Soft/ND/NT, positive BS  Musculoskeletal: No Edema  Neuro: alert, oriented to person only  Data Reviewed: Basic Metabolic Panel: Recent Labs  Lab 06/10/18 0026 06/10/18 0033 06/10/18 1658 06/11/18 0617 06/12/18 0508 06/13/18 1411  NA 134* 133* 131* 135 135 132*  K 3.8 3.7 3.9 3.9 3.6 3.4*  CL 94* 93* 90* 99 101 100  CO2 28  --   --  27 26 23   GLUCOSE 148* 144* 141* 126* 91 113*  BUN 18 20 16 12 12 13   CREATININE 1.50* 1.50* 1.40* 1.25* 1.27* 1.10*  CALCIUM 9.2  --   --  8.3* 8.3* 8.2*  MG  --   --   --   --  1.5* 1.7   Liver Function Tests: Recent Labs  Lab 06/10/18 0026 06/10/18 2204  AST 17 16  ALT 16 13  ALKPHOS 40 31*  BILITOT 0.6 0.8  PROT 6.8 4.9*  ALBUMIN 3.8 2.7*   No results for input(s): LIPASE, AMYLASE in the last 168 hours. Recent Labs  Lab 06/10/18 2133  AMMONIA 12   CBC: Recent Labs  Lab 06/10/18 0026  06/10/18 1551 06/10/18 1658 06/11/18 0617 06/12/18 0508 06/13/18 1411  WBC 8.6  --  8.9  --  8.2 8.0 10.4  NEUTROABS 6.2  --  7.1  --   --   --   --   HGB 11.2*   < > 11.4* 11.6* 10.4* 10.3* 10.7*  HCT 35.6*   < > 36.3 34.0* 32.2* 32.2* 33.2*  MCV 87.7  --  87.3  --  87.3 87.0 85.8  PLT 238  --  258  --  208 200 237   < > = values in this interval not displayed.   Cardiac Enzymes:   No results for input(s): CKTOTAL, CKMB, CKMBINDEX, TROPONINI in the last 168 hours. BNP (last 3 results) No results for input(s): BNP in the last 8760 hours.  ProBNP (last 3 results) No results for  input(s): PROBNP in the last 8760 hours.  CBG: Recent Labs  Lab 06/10/18 0155 06/10/18 1329 06/11/18 0758 06/12/18 0810 06/13/18 0746  GLUCAP 129* 131* 110* 139* 123*    No results found for this or any previous visit (from the past 240 hour(s)).   Studies: No results found.  Scheduled Meds: . aspirin EC  81 mg Oral Daily  . calcium-vitamin D  1 tablet Oral q morning - 10a  . carvedilol  12.5 mg Oral BID WC  . ezetimibe  10 mg Oral QAC breakfast  . fentaNYL      . ferrous sulfate  325 mg Oral Q breakfast  . glipiZIDE  5 mg Oral QAC breakfast  . heparin      . levothyroxine  112 mcg Oral QAC breakfast  . lidocaine      . losartan  100 mg Oral Daily  . multivitamin with minerals  1 tablet Oral q morning - 10a  . potassium chloride  40 mEq Oral Once    Continuous Infusions: . sodium chloride    . magnesium sulfate 1 - 4 g bolus IVPB       Time spent: 8mins,   I have personally reviewed and interpreted on  06/13/2018 daily labs, tele strips, imagings as discussed above under date review session and assessment and plans.  I reviewed all nursing notes, pharmacy notes, consultant notes,  vitals, pertinent old records  I have discussed plan of care as described above with RN , patient and family on 06/13/2018   Florencia Reasons MD, PhD  [facility] Pager [phone]. If 7PM-7AM, please contact night-coverage at [email or web address], password [facility] 06/13/2018, 3:35 PM  LOS: 2 days

## 2018-06-14 LAB — BASIC METABOLIC PANEL
Anion gap: 11 (ref 5–15)
BUN: 13 mg/dL (ref 8–23)
CALCIUM: 8.3 mg/dL — AB (ref 8.9–10.3)
CO2: 22 mmol/L (ref 22–32)
CREATININE: 1.16 mg/dL — AB (ref 0.44–1.00)
Chloride: 100 mmol/L (ref 98–111)
GFR calc Af Amer: 49 mL/min — ABNORMAL LOW (ref >60)
GFR calc non Af Amer: 42 mL/min — ABNORMAL LOW (ref >60)
Glucose, Bld: 102 mg/dL — ABNORMAL HIGH (ref 70–99)
Potassium: 4 mmol/L (ref 3.5–5.1)
SODIUM: 133 mmol/L — AB (ref 135–145)

## 2018-06-14 LAB — GLUCOSE, CAPILLARY: GLUCOSE-CAPILLARY: 107 mg/dL — AB (ref 70–99)

## 2018-06-14 LAB — MAGNESIUM: MAGNESIUM: 2.3 mg/dL (ref 1.7–2.4)

## 2018-06-14 NOTE — Progress Notes (Addendum)
PROGRESS NOTE  Alisha [name] TJQ:[id number] DOB: [birth year] DOA: 06/10/2018 PCP: Kelton Pillar, MD  HPI/Recap of past 24 hours:  She has remarkable improvement today She is oriented x3, speech is clear She denies headache or dizziness, but she reports she did not remember what has happened,  She feel strange, she reports seeing red and green when she closes her eyes She remain very weak, but mentation has much improved Son at bedside    Assessment/Plan: Principal Problem:   Acute metabolic encephalopathy Active Problems:   Hypertension   Confusion  Acute encephalopathy with slurred speech: -possibe hypertensive encephalopathy, possible underline undetected vascular dementia -eeg unremarkable, UA unremarkable, ammonia unremarkable -mri no acute stroke, does has Age-related cerebral atrophy with moderate chronic small vessel ischemic disease -mra+ Short-segment severe stenosis at the right cervical-petrous.Otherwise widely patent anterior circulation.  Nonvisualization of the hypoplastic right vertebral artery at the skull base, which may be occluded. Dominant left vertebral artery widely patent as is the remainder of the vertebrobasilar circulation. -case discussed with neurology and interventional radiology, cerebral arteriogram on 7/26  -S/P 4 vessel cerebral arteriogram RT CFA approach. Findings. 1No gross occlusions intracranially or extracranially. 2. Venous outflow WNLs -she is started on thiamine supplement on 7/26 -mentation has much improved on 7/27   Hypokalemia/hypomegnesemia: Improved, continue hold hctz    HTN; bp control Remain elevated, d/c lopressor change to coreg, continue cozaar Continue adjust bp meds  noninsulin dependent dm2,  a1c 6.2 Hold home meds metformin,  Anemia of chronic disease: hgb stable at 10  CKD III Cr close to baseline.  H/o breast cancer s/p left mastectomy  She is on chronic iron and b12 supplement  Code Status:  full  Family Communication: patient , son at bedside  Disposition Plan: SNF placement   Consultants:  Case discussed with neurology Dr Aroor  Case discussed with interventional radiology Dr Clinton Sawyer  Procedures:  EEG  Cerebral angiogram  Antibiotics:  none   Objective: BP 140/65 (BP Location: Left Arm)   Pulse 69   Temp (!) 97.4 F (36.3 C) (Oral)   Resp 20   Ht 5\' 2"  (1.575 m)   Wt 69.2 kg (152 lb 8.9 oz)   SpO2 96%   BMI 27.90 kg/m   Intake/Output Summary (Last 24 hours) at 06/14/2018 1344 Last data filed at 06/14/2018 0347 Gross per 24 hour  Intake 208.55 ml  Output 2 ml  Net 206.55 ml   Filed Weights   06/11/18 0426  Weight: 69.2 kg (152 lb 8.9 oz)    Exam: Patient is examined daily including today on 06/14/2018, exams remain the same as of yesterday except that has changed    General:  NAD, aaox3, retrograde amnesia  Cardiovascular: RRR  Respiratory: CTABL  Abdomen: Soft/ND/NT, positive BS  Musculoskeletal: No Edema  Neuro: alert, aaox3, speech is clear  Data Reviewed: Basic Metabolic Panel: Recent Labs  Lab 06/10/18 0026  06/10/18 1658 06/11/18 0617 06/12/18 0508 06/13/18 1411 06/14/18 0339  NA 134*   < > 131* 135 135 132* 133*  K 3.8   < > 3.9 3.9 3.6 3.4* 4.0  CL 94*   < > 90* 99 101 100 100  CO2 28  --   --  27 26 23 22   GLUCOSE 148*   < > 141* 126* 91 113* 102*  BUN 18   < > 16 12 12 13 13   CREATININE 1.50*   < > 1.40* 1.25* 1.27* 1.10* 1.16*  CALCIUM 9.2  --   --  8.3* 8.3* 8.2* 8.3*  MG  --   --   --   --  1.5* 1.7 2.3   < > = values in this interval not displayed.   Liver Function Tests: Recent Labs  Lab 06/10/18 0026 06/10/18 2204  AST 17 16  ALT 16 13  ALKPHOS 40 31*  BILITOT 0.6 0.8  PROT 6.8 4.9*  ALBUMIN 3.8 2.7*   No results for input(s): LIPASE, AMYLASE in the last 168 hours. Recent Labs  Lab 06/10/18 2133  AMMONIA 12   CBC: Recent Labs  Lab 06/10/18 0026  06/10/18 1551 06/10/18 1658  06/11/18 0617 06/12/18 0508 06/13/18 1411  WBC 8.6  --  8.9  --  8.2 8.0 10.4  NEUTROABS 6.2  --  7.1  --   --   --   --   HGB 11.2*   < > 11.4* 11.6* 10.4* 10.3* 10.7*  HCT 35.6*   < > 36.3 34.0* 32.2* 32.2* 33.2*  MCV 87.7  --  87.3  --  87.3 87.0 85.8  PLT 238  --  258  --  208 200 237   < > = values in this interval not displayed.   Cardiac Enzymes:   No results for input(s): CKTOTAL, CKMB, CKMBINDEX, TROPONINI in the last 168 hours. BNP (last 3 results) No results for input(s): BNP in the last 8760 hours.  ProBNP (last 3 results) No results for input(s): PROBNP in the last 8760 hours.  CBG: Recent Labs  Lab 06/11/18 0758 06/12/18 0810 06/13/18 0746 06/13/18 1647 06/14/18 0748  GLUCAP 110* 139* 123* 120* 107*    No results found for this or any previous visit (from the past 240 hour(s)).   Studies: No results found.  Scheduled Meds: . aspirin EC  81 mg Oral Daily  . calcium-vitamin D  1 tablet Oral q morning - 10a  . carvedilol  12.5 mg Oral BID WC  . ezetimibe  10 mg Oral QAC breakfast  . ferrous sulfate  325 mg Oral Q breakfast  . glipiZIDE  5 mg Oral QAC breakfast  . levothyroxine  112 mcg Oral QAC breakfast  . losartan  100 mg Oral Daily  . multivitamin with minerals  1 tablet Oral q morning - 10a  . thiamine  100 mg Oral Daily    Continuous Infusions:    Time spent: 36mins,   I have personally reviewed and interpreted on  06/14/2018 daily labs,  imagings as discussed above under date review session and assessment and plans.  I reviewed all nursing notes, pharmacy notes, consultant notes,  vitals, pertinent old records  I have discussed plan of care as described above with RN , patient and family on 06/14/2018   Florencia Reasons MD, PhD  [facility] Pager [phone]. If 7PM-7AM, please contact night-coverage at [email or web address], password [facility] 06/14/2018, 1:44 PM  LOS: 3 days

## 2018-06-15 LAB — VITAMIN B1: Vitamin B1 (Thiamine): 151.3 nmol/L (ref 66.5–200.0)

## 2018-06-15 LAB — BASIC METABOLIC PANEL
Anion gap: 8 (ref 5–15)
BUN: 17 mg/dL (ref 8–23)
CHLORIDE: 101 mmol/L (ref 98–111)
CO2: 26 mmol/L (ref 22–32)
CREATININE: 1.36 mg/dL — AB (ref 0.44–1.00)
Calcium: 8.7 mg/dL — ABNORMAL LOW (ref 8.9–10.3)
GFR calc non Af Amer: 35 mL/min — ABNORMAL LOW (ref >60)
GFR, EST AFRICAN AMERICAN: 40 mL/min — AB (ref >60)
GLUCOSE: 116 mg/dL — AB (ref 70–99)
Potassium: 4.1 mmol/L (ref 3.5–5.1)
Sodium: 135 mmol/L (ref 135–145)

## 2018-06-15 LAB — GLUCOSE, CAPILLARY: Glucose-Capillary: 115 mg/dL — ABNORMAL HIGH (ref 70–99)

## 2018-06-15 MED ORDER — CARVEDILOL 25 MG PO TABS
25.0000 mg | ORAL_TABLET | Freq: Two times a day (BID) | ORAL | Status: DC
  Administered 2018-06-15 – 2018-06-16 (×2): 25 mg via ORAL
  Filled 2018-06-15 (×2): qty 1

## 2018-06-15 NOTE — Progress Notes (Signed)
NEURO HOSPITALIST PROGRESS NOTE   Subjective: Patient awake, alert, eyes opened, sitting up in the chair, NAD on RA. No family at bedside. Patient states that she feels much better, but still " not quite like herself". She remembers Dr. Erlinda Hong and states that she can remember bits a pieces of the past few days, but its cloudy. She stated " I remember somebody putting things on my head" which is probably in reference to the EEG.  Exam: Vitals:   06/14/18 2217 06/15/18 0503  BP: (!) 147/75 (!) 151/75  Pulse: 63 71  Resp: 19 18  Temp: 98.1 F (36.7 C) 98 F (36.7 C)  SpO2: 97% 93%    Physical Exam   HEENT-  Normocephalic, no lesions, without obvious abnormality.  Normal external eye and conjunctiva.   Cardiovascular-  pulses palpable throughout   Lungs- no excessive working breathing.  Saturations within normal limits on RA Extremities- Warm, dry and intact Musculoskeletal-no joint tenderness, deformity or swelling Skin-warm and dry, intact   Neuro:  Mental Status: Alert, oriented, to name/age/place/month/year.  Speech fluent without evidence of aphasia.  Able to follow commands without difficulty. Cranial Nerves: II:  Visual fields grossly normal,  III,IV, VI: ptosis not present, extra-ocular motions intact bilaterally pupils equal, round, reactive to light and accommodation V,VII: smile symmetric, facial light touch sensation normal bilaterally VIII: hearing normal bilaterally IX,X: uvula rises symmetrically XI: bilateral shoulder shrug XII: midline tongue extension Motor: Right : Upper extremity   5/5    Left:     Upper extremity   5/5  Lower extremity   5/5     Lower extremity   5/5 Tone and bulk:normal tone throughout; no atrophy noted Sensory: Pinprick and light touch intact throughout, bilaterally Deep Tendon Reflexes: 2+ and symmetric biceps, patella Plantars: Right: downgoing   Left: downgoing Cerebellar: normal finger-to-nose, slower rapid  alternating movements  Gait: deferred    Medications:  Scheduled: . aspirin EC  81 mg Oral Daily  . calcium-vitamin D  1 tablet Oral q morning - 10a  . carvedilol  12.5 mg Oral BID WC  . ezetimibe  10 mg Oral QAC breakfast  . ferrous sulfate  325 mg Oral Q breakfast  . glipiZIDE  5 mg Oral QAC breakfast  . levothyroxine  112 mcg Oral QAC breakfast  . losartan  100 mg Oral Daily  . multivitamin with minerals  1 tablet Oral q morning - 10a  . thiamine  100 mg Oral Daily   Continuous:  WFU:[id number], iohexol  Pertinent Labs/Diagnostics:   Cerebral angiogram:  1No gross occlusions intracranially or extracranially. 2. Venous outflow WNLs   Impression:Alisha D Smithis a 84 y.o.femalewith a history of significant hypertension, diabetes who was brought to the hospital by family secondary to noting that she had been more confused than usual or over the past 2 days. Lately she lives alone and at baseline she is highly functional. Of note over the past 48 hours prior to admission, she was non compliant with her BP meds.  Other work-up for confusion so far negative-EEG negative, B12 is high.  MR brain negative for stroke. Patient Mental status has much improved today.   1.  Possible hypertensive encephalopathy-normal CT head and MRI brain, MRA head showed severe stenosis of the right cervical petrous junction.  Recommend maintaining normotensive blood pressure.  EEG did not show any epileptiform activity.  2. Delirium with possible underlying mild cognitive impairment   3.  cerebral angiogram completed.    Recommendations:   -Ggradual reduction in BP To Goal of normotensive -Continue multivitamins -Neurology to sign-off at this time, please contact us with any concerns  Laurey Morale, MSN, NP-C [facility] [phone]  Attending neurologist's note to follow    06/15/2018, 11:37 AM   NEUROHOSPITALIST ADDENDUM Seen and examined the patient  today. I have reviewed the contents of history and physical exam as documented by PA/ARNP/Resident and agree with above documentation.  I have discussed and formulated the above plan as documented. Edits to the note have been made as needed.   Spoke to son, patient close to her baseline. Counseled that it is not a stroke/TIA. Vessel findings are chronic and given R ICA stenosis did not cause her confusion. Stated if she continued to have memory problems, could have cogntive evaluation with neuropsychology and outpatient follow up with neurologist.   Karena Addison Odessie Polzin MD [facility] [phone]   If 7pm to 7am, please call on call as listed on AMION.

## 2018-06-15 NOTE — Evaluation (Signed)
Occupational Therapy Evaluation Patient Details Name: Alisha [name] MRN: [id number] DOB: 1932/07/23 Today's Date: 06/15/2018    History of Present Illness Alisha [name] is a 84 y.o. female brought in by family for the second time in the last 24 hours for confusion, admitted 06/10/18; PMH: anemia, hypertension, diabetes;  Patient at baseline is normal lives alone and highly functional; MRI, head CT and EEG all normal; pt is scheduled for cerebral angiogram on 7/26   Clinical Impression   Pt reports she was independent with ADL PTA and mod I with mobility. Pt currently min guard for functional mobility and ADL in standing. Pt A&O x4, answers questions appropriately, and follows commands consistently. Recommending [facility] with 24/7 supervision upon return home; pt may benefit from a program like [facility] if eligible. If pt does not have 24/7 supervision, would need short term SNF placement. Pt would benefit from continued skilled OT to address established goals.    Follow Up Recommendations  Home health OT;Supervision/Assistance - 24 hour(Pt will need SNF if she does not have 24/7; pt could benefit from program like [facility] if eligible)   Equipment Recommendations  None recommended by OT    Recommendations for Other Services       Precautions / Restrictions Precautions Precautions: Fall Restrictions Weight Bearing Restrictions: No      Mobility Bed Mobility Overal bed mobility: Modified Independent Bed Mobility: Supine to Sit;Sit to Supine     Supine to sit: Modified independent (Device/Increase time) Sit to supine: Modified independent (Device/Increase time)   General bed mobility comments: HOB elevated with increased time, no physical assist required  Transfers Overall transfer level: Needs assistance Equipment used: Rolling walker (2 wheeled) Transfers: Sit to/from Stand Sit to Stand: Min guard         General transfer comment: Good hand placement and  technique. Min guard for safety    Balance Overall balance assessment: Mild deficits observed, not formally tested                                         ADL either performed or assessed with clinical judgement   ADL Overall ADL's : Needs assistance/impaired Eating/Feeding: Set up;Sitting   Grooming: Supervision/safety;Standing;Oral care   Upper Body Bathing: Set up;Supervision/ safety;Sitting   Lower Body Bathing: Min guard;Sit to/from stand   Upper Body Dressing : Set up;Supervision/safety;Sitting   Lower Body Dressing: Min guard;Sit to/from stand Lower Body Dressing Details (indicate cue type and reason): Able to adjust socks in siting Toilet Transfer: Min guard;Ambulation;Regular Toilet;Grab bars;RW   Toileting- Water quality scientist and Hygiene: Supervision/safety;Sit to/from stand       Functional mobility during ADLs: Surveyor, minerals     Praxis      Pertinent Vitals/Pain Pain Assessment: No/denies pain     Hand Dominance     Extremity/Trunk Assessment Upper Extremity Assessment Upper Extremity Assessment: Overall WFL for tasks assessed   Lower Extremity Assessment Lower Extremity Assessment: Defer to PT evaluation       Communication Communication Communication: No difficulties   Cognition Arousal/Alertness: Awake/alert Behavior During Therapy: WFL for tasks assessed/performed Overall Cognitive Status: No family/caregiver present to determine baseline cognitive functioning  Memory: Decreased short-term memory         General Comments: Pt A&O x4 but fuzzy on details of hospital stay.    General Comments       Exercises     Shoulder Instructions      Home Living Family/patient expects to be discharged to:: Private residence Living Arrangements: Alone Available Help at Discharge: Family;Available PRN/intermittently Type of Home: House Home  Access: Ramped entrance     Home Layout: One level     Bathroom Shower/Tub: Occupational psychologist: Handicapped height     Home Equipment: Environmental consultant - 2 wheels;Cane - single point;Wheelchair - manual;Shower seat;Grab bars - toilet;Grab bars - tub/shower          Prior Functioning/Environment Level of Independence: Independent with assistive device(s)        Comments: amb with cane        OT Problem List: Impaired balance (sitting and/or standing);Decreased cognition      OT Treatment/Interventions: Self-care/ADL training;DME and/or AE instruction;Therapeutic activities;Patient/family education;Balance training    OT Goals(Current goals can be found in the care plan section) Acute Rehab OT Goals Patient Stated Goal: return home OT Goal Formulation: With patient Time For Goal Achievement: 06/29/18 Potential to Achieve Goals: Good ADL Goals Pt Will Transfer to Toilet: with modified independence;ambulating;regular height toilet Pt Will Perform Toileting - Clothing Manipulation and hygiene: with modified independence;sit to/from stand Pt Will Perform Tub/Shower Transfer: Shower transfer;with modified independence;ambulating;shower seat;rolling walker;grab bars Additional ADL Goal #1: Pt will gather ADL items and perform ADL with mod I.  OT Frequency: Min 2X/week   Barriers to D/C: Decreased caregiver support  pt lives alone       Co-evaluation              AM-PAC PT "6 Clicks" Daily Activity     Outcome Measure Help from another person eating meals?: None Help from another person taking care of personal grooming?: A Little Help from another person toileting, which includes using toliet, bedpan, or urinal?: A Little Help from another person bathing (including washing, rinsing, drying)?: A Little Help from another person to put on and taking off regular upper body clothing?: A Little Help from another person to put on and taking off regular lower body  clothing?: A Little 6 Click Score: 19   End of Session Equipment Utilized During Treatment: Gait belt;Rolling walker  Activity Tolerance: Patient tolerated treatment well Patient left: in bed;with call bell/phone within reach;with bed alarm set  OT Visit Diagnosis: Unsteadiness on feet (R26.81);Other symptoms and signs involving cognitive function                Time: 2979-8921 OT Time Calculation (min): 16 min Charges:  OT General Charges $OT Visit: 1 Visit OT Evaluation $OT Eval Moderate Complexity: 1 Mod  Earley Grobe A. Ulice Brilliant, M.S., OTR/L Acute Rehab Department: [phone]   Binnie Kand 06/15/2018, 1:15 PM

## 2018-06-15 NOTE — Progress Notes (Signed)
PROGRESS NOTE  Alisha [name] [id number]:[id number] DOB: Jun 12, 1932 DOA: 06/10/2018 PCP: Kelton Pillar, MD  HPI/Recap of past 24 hours:   She is oriented x3, speech is clear, she still has impaired short term memory, retrograde amnesia She denies headache or dizziness, She continue to reports feeling strange, she reports seeing red and green when she closes her eyes She remain very weak,  Son at bedside    Assessment/Plan: Principal Problem:   Acute metabolic encephalopathy Active Problems:   Hypertension   Confusion  Acute encephalopathy with slurred speech: -possibe hypertensive encephalopathy, possible underline undetected vascular dementia -eeg unremarkable, UA unremarkable, ammonia unremarkable -mri no acute stroke, does has Age-related cerebral atrophy with moderate chronic small vessel ischemic disease -mra+ Short-segment severe stenosis at the right cervical-petrous.Otherwise widely patent anterior circulation.  Nonvisualization of the hypoplastic right vertebral artery at the skull base, which may be occluded. Dominant left vertebral artery widely patent as is the remainder of the vertebrobasilar circulation. -case discussed with neurology and interventional radiology, cerebral arteriogram on 7/26  -S/P 4 vessel cerebral arteriogram RT CFA approach. Findings. 1No gross occlusions intracranially or extracranially. 2. Venous outflow WNLs -she is started on thiamine supplement on 7/26 -mentation has much improved since 7/27   Hypokalemia/hypomegnesemia: Improved, continue hold hctz    HTN;  Home meds lopressor d/ed,  changed to coreg, continue cozaar Remain elevated, increase coreg.  noninsulin dependent dm2,  a1c 6.2 Hold home meds metformin,  Anemia of chronic disease: hgb stable at 10  CKD III Cr close to baseline.  H/o breast cancer s/p left mastectomy  She is on chronic iron and b12 supplement  Code Status: full  Family Communication: patient , son  at bedside  Disposition Plan: SNF placement, hopefully on 7/29 She does not have 24/7 care at home   Consultants:  Case discussed with neurology Dr Aroor  Case discussed with interventional radiology Dr Clinton Sawyer  Procedures:  EEG  Cerebral angiogram  Antibiotics:  none   Objective: BP (!) 151/75 (BP Location: Left Arm)   Pulse 71   Temp 98 F (36.7 C) (Oral)   Resp 18   Ht 5\' 2"  (1.575 m)   Wt 69.2 kg (152 lb 8.9 oz)   SpO2 93%   BMI 27.90 kg/m   Intake/Output Summary (Last 24 hours) at 06/15/2018 1219 Last data filed at 06/15/2018 0900 Gross per 24 hour  Intake 1200 ml  Output -  Net 1200 ml   Filed Weights   06/11/18 0426  Weight: 69.2 kg (152 lb 8.9 oz)    Exam: Patient is examined daily including today on 06/15/2018, exams remain the same as of yesterday except that has changed    General:  NAD, aaox3, retrograde amnesia  Cardiovascular: RRR  Respiratory: CTABL  Abdomen: Soft/ND/NT, positive BS  Musculoskeletal: No Edema  Neuro: alert, aaox3, speech is clear  Data Reviewed: Basic Metabolic Panel: Recent Labs  Lab 06/11/18 0617 06/12/18 0508 06/13/18 1411 06/14/18 0339 06/15/18 0714  NA 135 135 132* 133* 135  K 3.9 3.6 3.4* 4.0 4.1  CL 99 101 100 100 101  CO2 27 26 23 22 26   GLUCOSE 126* 91 113* 102* 116*  BUN 12 12 13 13 17   CREATININE 1.25* 1.27* 1.10* 1.16* 1.36*  CALCIUM 8.3* 8.3* 8.2* 8.3* 8.7*  MG  --  1.5* 1.7 2.3  --    Liver Function Tests: Recent Labs  Lab 06/10/18 0026 06/10/18 2204  AST 17 16  ALT 16 13  ALKPHOS 40 31*  BILITOT 0.6 0.8  PROT 6.8 4.9*  ALBUMIN 3.8 2.7*   No results for input(s): LIPASE, AMYLASE in the last 168 hours. Recent Labs  Lab 06/10/18 2133  AMMONIA 12   CBC: Recent Labs  Lab 06/10/18 0026  06/10/18 1551 06/10/18 1658 06/11/18 0617 06/12/18 0508 06/13/18 1411  WBC 8.6  --  8.9  --  8.2 8.0 10.4  NEUTROABS 6.2  --  7.1  --   --   --   --   HGB 11.2*   < > 11.4* 11.6* 10.4*  10.3* 10.7*  HCT 35.6*   < > 36.3 34.0* 32.2* 32.2* 33.2*  MCV 87.7  --  87.3  --  87.3 87.0 85.8  PLT 238  --  258  --  208 200 237   < > = values in this interval not displayed.   Cardiac Enzymes:   No results for input(s): CKTOTAL, CKMB, CKMBINDEX, TROPONINI in the last 168 hours. BNP (last 3 results) No results for input(s): BNP in the last 8760 hours.  ProBNP (last 3 results) No results for input(s): PROBNP in the last 8760 hours.  CBG: Recent Labs  Lab 06/12/18 0810 06/13/18 0746 06/13/18 1647 06/14/18 0748 06/15/18 0801  GLUCAP 139* 123* 120* 107* 115*    No results found for this or any previous visit (from the past 240 hour(s)).   Studies: No results found.  Scheduled Meds: . aspirin EC  81 mg Oral Daily  . calcium-vitamin D  1 tablet Oral q morning - 10a  . carvedilol  25 mg Oral BID WC  . ezetimibe  10 mg Oral QAC breakfast  . ferrous sulfate  325 mg Oral Q breakfast  . glipiZIDE  5 mg Oral QAC breakfast  . levothyroxine  112 mcg Oral QAC breakfast  . losartan  100 mg Oral Daily  . multivitamin with minerals  1 tablet Oral q morning - 10a  . thiamine  100 mg Oral Daily    Continuous Infusions:    Time spent: 72mins,   I have personally reviewed and interpreted on  06/15/2018 daily labs,  imagings as discussed above under date review session and assessment and plans.  I reviewed all nursing notes, pharmacy notes, consultant notes,  vitals, pertinent old records  I have discussed plan of care as described above with RN , patient and family on 06/15/2018   Florencia Reasons MD, PhD  [facility] Pager [phone]. If 7PM-7AM, please contact night-coverage at [email or web address], password [facility] 06/15/2018, 12:19 PM  LOS: 4 days

## 2018-06-16 ENCOUNTER — Encounter (HOSPITAL_COMMUNITY): Payer: Self-pay | Admitting: Interventional Radiology

## 2018-06-16 DIAGNOSIS — E538 Deficiency of other specified B group vitamins: Secondary | ICD-10-CM | POA: Diagnosis not present

## 2018-06-16 DIAGNOSIS — E611 Iron deficiency: Secondary | ICD-10-CM | POA: Diagnosis not present

## 2018-06-16 DIAGNOSIS — G9341 Metabolic encephalopathy: Secondary | ICD-10-CM | POA: Diagnosis not present

## 2018-06-16 DIAGNOSIS — R278 Other lack of coordination: Secondary | ICD-10-CM | POA: Diagnosis not present

## 2018-06-16 DIAGNOSIS — I10 Essential (primary) hypertension: Secondary | ICD-10-CM | POA: Diagnosis not present

## 2018-06-16 DIAGNOSIS — I6521 Occlusion and stenosis of right carotid artery: Secondary | ICD-10-CM | POA: Diagnosis not present

## 2018-06-16 DIAGNOSIS — R2681 Unsteadiness on feet: Secondary | ICD-10-CM | POA: Diagnosis not present

## 2018-06-16 DIAGNOSIS — I679 Cerebrovascular disease, unspecified: Secondary | ICD-10-CM | POA: Diagnosis not present

## 2018-06-16 DIAGNOSIS — E079 Disorder of thyroid, unspecified: Secondary | ICD-10-CM | POA: Diagnosis not present

## 2018-06-16 DIAGNOSIS — R410 Disorientation, unspecified: Secondary | ICD-10-CM | POA: Diagnosis not present

## 2018-06-16 DIAGNOSIS — G9340 Encephalopathy, unspecified: Secondary | ICD-10-CM | POA: Diagnosis not present

## 2018-06-16 DIAGNOSIS — M6281 Muscle weakness (generalized): Secondary | ICD-10-CM | POA: Diagnosis not present

## 2018-06-16 DIAGNOSIS — E786 Lipoprotein deficiency: Secondary | ICD-10-CM | POA: Diagnosis not present

## 2018-06-16 DIAGNOSIS — K5900 Constipation, unspecified: Secondary | ICD-10-CM | POA: Diagnosis not present

## 2018-06-16 DIAGNOSIS — R42 Dizziness and giddiness: Secondary | ICD-10-CM | POA: Diagnosis not present

## 2018-06-16 DIAGNOSIS — E119 Type 2 diabetes mellitus without complications: Secondary | ICD-10-CM | POA: Diagnosis not present

## 2018-06-16 DIAGNOSIS — Z7982 Long term (current) use of aspirin: Secondary | ICD-10-CM | POA: Diagnosis not present

## 2018-06-16 DIAGNOSIS — R41841 Cognitive communication deficit: Secondary | ICD-10-CM | POA: Diagnosis not present

## 2018-06-16 LAB — BASIC METABOLIC PANEL WITH GFR
Anion gap: 9 (ref 5–15)
BUN: 20 mg/dL (ref 8–23)
CO2: 27 mmol/L (ref 22–32)
Calcium: 8.5 mg/dL — ABNORMAL LOW (ref 8.9–10.3)
Chloride: 100 mmol/L (ref 98–111)
Creatinine, Ser: 1.48 mg/dL — ABNORMAL HIGH (ref 0.44–1.00)
GFR calc Af Amer: 36 mL/min — ABNORMAL LOW
GFR calc non Af Amer: 31 mL/min — ABNORMAL LOW
Glucose, Bld: 108 mg/dL — ABNORMAL HIGH (ref 70–99)
Potassium: 4.1 mmol/L (ref 3.5–5.1)
Sodium: 136 mmol/L (ref 135–145)

## 2018-06-16 LAB — GLUCOSE, CAPILLARY: Glucose-Capillary: 103 mg/dL — ABNORMAL HIGH (ref 70–99)

## 2018-06-16 MED ORDER — THIAMINE HCL 100 MG PO TABS: 100.0000 mg | ORAL_TABLET | Freq: Every day | ORAL | 0 refills | Status: DC

## 2018-06-16 MED ORDER — LOSARTAN POTASSIUM 100 MG PO TABS: 100.0000 mg | ORAL_TABLET | Freq: Every day | ORAL | 0 refills | Status: DC

## 2018-06-16 MED ORDER — ISOSORBIDE MONONITRATE ER 30 MG PO TB24: 30.0000 mg | ORAL_TABLET | Freq: Every day | ORAL | 0 refills | Status: AC

## 2018-06-16 MED ORDER — ISOSORBIDE MONONITRATE ER 30 MG PO TB24
30.0000 mg | ORAL_TABLET | Freq: Every day | ORAL | Status: DC
  Administered 2018-06-16: 30 mg via ORAL
  Filled 2018-06-16: qty 1

## 2018-06-16 MED ORDER — FERROUS SULFATE 325 (65 FE) MG PO TABS: 325.0000 mg | ORAL_TABLET | Freq: Every day | ORAL | 0 refills | Status: DC

## 2018-06-16 MED ORDER — CARVEDILOL 25 MG PO TABS: 25.0000 mg | ORAL_TABLET | Freq: Two times a day (BID) | ORAL | 0 refills | Status: AC

## 2018-06-16 MED ORDER — SENNOSIDES-DOCUSATE SODIUM 8.6-50 MG PO TABS: 1.0000 | ORAL_TABLET | Freq: Every day | ORAL | Status: DC

## 2018-06-16 MED ORDER — METFORMIN HCL 850 MG PO TABS: 850.0000 mg | ORAL_TABLET | Freq: Two times a day (BID) | ORAL | 0 refills | Status: DC

## 2018-06-16 MED ORDER — SENNOSIDES-DOCUSATE SODIUM 8.6-50 MG PO TABS: 1.0000 | ORAL_TABLET | Freq: Every day | ORAL | 0 refills | Status: DC

## 2018-06-16 NOTE — Progress Notes (Signed)
Physical Therapy Treatment Patient Details Name: Alisha [name] MRN: [id number] DOB: September 27, 1932 Today's Date: 06/16/2018    History of Present Illness Alisha [name] is a 84 y.o. female brought in by family for the second time in the last 24 hours for confusion, admitted 06/10/18; PMH: anemia, hypertension, diabetes;  Patient at baseline is normal lives alone and highly functional; MRI, head CT and EEG all normal; pt is scheduled for cerebral angiogram on 7/26    PT Comments    Patient is making progress toward PT goals. Pt continues to demonstrate decreased activity tolerance and balance deficits. Unless pt has 24 hour assistance/supervision pt will need ST-SNF for further skilled PT services to maximize independence and safety with mobility. Pt may benefit from [facility] program if eligible. Continue to progress as tolerated.    Follow Up Recommendations  Home health PT;Supervision for mobility/OOB(vs SNF pending progress)     Equipment Recommendations  Other (comment)(TBD)    Recommendations for Other Services       Precautions / Restrictions Precautions Precautions: Fall Restrictions Weight Bearing Restrictions: No    Mobility  Bed Mobility               General bed mobility comments: pt OOB in chair upon arrival  Transfers Overall transfer level: Needs assistance Equipment used: None Transfers: Sit to/from Stand Sit to Stand: Min guard         General transfer comment: min guard for safety  Ambulation/Gait Ambulation/Gait assistance: Min guard   Assistive device: None Gait Pattern/deviations: Step-through pattern;Decreased stride length;Drifts right/left(L LE length discrepency) Gait velocity: decreased   General Gait Details: min guard for safety due to instability while ambulating    Stairs             Wheelchair Mobility    Modified Rankin (Stroke Patients Only)       Balance Overall balance assessment: Mild deficits observed,  not formally tested Sitting-balance support: Feet supported;No upper extremity supported Sitting balance-Leahy Scale: Fair     Standing balance support: During functional activity;No upper extremity supported Standing balance-Leahy Scale: Fair                              Cognition Arousal/Alertness: Awake/alert Behavior During Therapy: WFL for tasks assessed/performed Overall Cognitive Status: No family/caregiver present to determine baseline cognitive functioning Area of Impairment: Memory                     Memory: Decreased short-term memory                Exercises      General Comments General comments (skin integrity, edema, etc.): pt SOB with mobility; VSS      Pertinent Vitals/Pain Pain Assessment: No/denies pain    Home Living                      Prior Function            PT Goals (current goals can now be found in the care plan section) Acute Rehab PT Goals Patient Stated Goal: return home PT Goal Formulation: With patient Time For Goal Achievement: 06/26/18 Potential to Achieve Goals: Good Progress towards PT goals: Progressing toward goals    Frequency    Min 3X/week      PT Plan Current plan remains appropriate    Co-evaluation  AM-PAC PT "6 Clicks" Daily Activity  Outcome Measure  Difficulty turning over in bed (including adjusting bedclothes, sheets and blankets)?: A Lot Difficulty moving from lying on back to sitting on the side of the bed? : Unable Difficulty sitting down on and standing up from a chair with arms (e.g., wheelchair, bedside commode, etc,.)?: Unable Help needed moving to and from a bed to chair (including a wheelchair)?: A Little Help needed walking in hospital room?: A Little Help needed climbing 3-5 steps with a railing? : A Lot 6 Click Score: 12    End of Session Equipment Utilized During Treatment: Gait belt Activity Tolerance: Patient tolerated treatment  well Patient left: with call bell/phone within reach;in chair;with chair alarm set Nurse Communication: Mobility status PT Visit Diagnosis: Difficulty in walking, not elsewhere classified (R26.2);Unsteadiness on feet (R26.81)     Time: 2831-5176 PT Time Calculation (min) (ACUTE ONLY): 17 min  Charges:  $Gait Training: 8-22 mins                     Earney Navy, PTA Pager: [phone]     Darliss Cheney 06/16/2018, 10:16 AM

## 2018-06-16 NOTE — Care Management Note (Signed)
Case Management Note  Patient Details  Name: Alisha [name] MRN: [id number] Date of Birth: 07-Sep-1932  Subjective/Objective:   Acute metabolic, hx of of hypertension, diabetes.   enchephalopaty.  Resides alone.          Alisha [name] ([address] Alisha [name] ([phone]     PCP: Debe Coder  Action/Plan: Per PT' s recommendations: Home health PT;Supervision for mobility/OOB(vs SNF pending progress).  Plan is to d/c today per MD. Pt lives alone, will require supervision, no 24/7 supervision @ home. Agreeable to SNF placement . Pt will  transition to SNF/ rehab....CSW managing disposition to facility.  No present needs identified per NCM.   Expected Discharge Date:  06/16/18               Expected Discharge Plan:  Skilled Nursing Facility  In-House Referral:  Clinical Social Work  Discharge planning Services  CM Consult  Post Acute Care Choice:    Choice offered to:     DME Arranged:    DME Agency:     HH Arranged:    [facility] Agency:     Status of Service:  Completed, signed off  If discussed at [employer], dates discussed:    Additional Comments:  Sharin Mons, RN 06/16/2018, 10:34 AM

## 2018-06-16 NOTE — Progress Notes (Signed)
Report called to nurse Christina at [facility]. Discharge summary reviewed with RN and all questions answered. D/C AVS printed and extra copy printed for family to take with them to [facility] just in case and all prescriptions were called into [facility] on [address] in [place] and family informed.  [facility] B[facility] to be D/C'd Skilled nursing facility per MD order.  Discussed prescriptions and follow up appointments with the patient. Prescriptions given to patient, medication list explained in detail. Pt verbalized understanding.  Allergies as of 06/16/2018      Reactions   Ace Inhibitors Cough   Codeine Nausea Only   Evista [raloxifene Hydrochloride] Other (See Comments)   cramps   Zocor [simvastatin - High Dose] Other (See Comments)   Cramps       Medication List    STOP taking these medications   losartan-hydrochlorothiazide 100-25 MG tablet Commonly known as:  HYZAAR   metoprolol succinate 100 MG 24 hr tablet Commonly known as:  TOPROL-XL     TAKE these medications   aspirin EC 81 MG tablet Take 1 tablet (81 mg total) by mouth daily.   CALCIUM 600 + D 600-200 MG-UNIT Tabs Generic drug:  Calcium Carb-Cholecalciferol Take 1 tablet by mouth every morning.   carvedilol 25 MG tablet Commonly known as:  COREG Take 1 tablet (25 mg total) by mouth 2 (two) times daily with a meal.   cyanocobalamin 1000 MCG/ML injection Commonly known as:  (VITAMIN B-12) Inject 1,000 mcg into the muscle every 3 (three) months.   ferrous sulfate 325 (65 FE) MG tablet Take 1 tablet (325 mg total) by mouth daily with breakfast. Start taking on:  06/17/2018 What changed:  when to take this   GLIPIZIDE XL 5 MG 24 hr tablet Generic drug:  glipiZIDE Take 5 mg by mouth daily before breakfast.   isosorbide mononitrate 30 MG 24 hr tablet Commonly known as:  IMDUR Take 1 tablet (30 mg total) by mouth daily. Start taking on:  06/17/2018   levothyroxine 112 MCG tablet Commonly known as:  SYNTHROID,  LEVOTHROID Take 112 mcg by mouth daily before breakfast.   losartan 100 MG tablet Commonly known as:  COZAAR Take 1 tablet (100 mg total) by mouth daily. Start taking on:  06/17/2018   metFORMIN 850 MG tablet Commonly known as:  GLUCOPHAGE Take 1 tablet (850 mg total) by mouth 2 (two) times daily with a meal. What changed:  when to take this   multivitamin with minerals Tabs tablet Take 1 tablet by mouth every morning.   senna-docusate 8.6-50 MG tablet Commonly known as:  Senokot-S Take 1 tablet by mouth at bedtime.   thiamine 100 MG tablet Take 1 tablet (100 mg total) by mouth daily. Start taking on:  06/17/2018   UNABLE TO FIND 174.9 Right Mastectomy   L8000- Post Surgical Bras-6  R4854- Silicone Breast Prosthesis-1   ZETIA 10 MG tablet Generic drug:  ezetimibe Take 10 mg by mouth daily before breakfast.       Vitals:   06/15/18 2228 06/16/18 0623  BP: (!) 161/72 (!) 156/73  Pulse: (!) 57 75  Resp: 18 18  Temp: 98.3 F (36.8 C) 98.2 F (36.8 C)  SpO2: 100% 97%    Skin clean, dry and intact without evidence of skin break down, no evidence of skin tears noted. IV catheter discontinued intact. Site without signs and symptoms of complications. Dressing and pressure applied. Pt denies pain at this time. No complaints noted.  An After  Visit Summary was printed and given to the patient. Patient escorted via WC, and D/C to [facility] via son's private auto.  Chapman Fitch BSN, RN [employer] [facility] Phone [phone]

## 2018-06-16 NOTE — Progress Notes (Signed)
Occupational Therapy Treatment Patient Details Name: KALANIE FEWELL MRN: [id number] DOB: [birth year] Today's Date: 06/16/2018    History of present illness PATRA GHERARDI is a 84 y.o. female brought in by family for the second time in the last 24 hours for confusion, admitted 06/10/18; PMH: anemia, hypertension, diabetes;  Patient at baseline is normal lives alone and highly functional; MRI, head CT and EEG all normal; pt is scheduled for cerebral angiogram on 7/26   OT comments  Pt progressing toward goals. Pt required minguard during functional mobility and minguard to complete oral care while standing at sink level. Pt's son present toward end of session, reported pt was having difficulty with medication management and missing doses prior to arrival. Educated pt and son on importance of educated physician about difficulty with med management and importance of S with med management upon return home. D/c remains appropriate.All additional OT needs to be addressed by d/c venue. Pt appropriate to d/c when medically stable.    Follow Up Recommendations  Home health OT;Supervision/Assistance - 24 hour(Pt will need SNF if she does not have 24/7)    Equipment Recommendations  None recommended by OT    Recommendations for Other Services      Precautions / Restrictions Precautions Precautions: Fall Restrictions Weight Bearing Restrictions: No       Mobility Bed Mobility               General bed mobility comments: pt standing at sink to complete oral care upon arrival  Transfers Overall transfer level: Needs assistance Equipment used: None Transfers: Sit to/from Stand Sit to Stand: Supervision         General transfer comment: min guard for safety    Balance Overall balance assessment: Mild deficits observed, not formally tested Sitting-balance support: Feet supported;No upper extremity supported Sitting balance-Leahy Scale: Fair     Standing balance support: During  functional activity;No upper extremity supported Standing balance-Leahy Scale: Fair Standing balance comment: intermittent UE support on furniture                           ADL either performed or assessed with clinical judgement   ADL Overall ADL's : Needs assistance/impaired     Grooming: Supervision/safety;Standing Grooming Details (indicate cue type and reason): pt performed oral care while standing at sink Upper Body Bathing: Set up;Supervision/ safety;Sitting               Toilet Transfer: Min guard;Ambulation;Regular Toilet   Toileting- Water quality scientist and Hygiene: Supervision/safety;Sit to/from stand       Functional mobility during ADLs: Min guard       Vision       Perception     Praxis      Cognition Arousal/Alertness: Awake/alert Behavior During Therapy: WFL for tasks assessed/performed Overall Cognitive Status: Impaired/Different from baseline Area of Impairment: Memory                     Memory: Decreased short-term memory         General Comments: pt reports difficulty with memory;pt's son report pt had missed doses of medication prior to arrival;educated pt and son on importance of S with medication management;educated pt and son on importance of notifying physician of difficulty with medication management         Exercises     Shoulder Instructions       General Comments pt stated she feels she needs SNF  prior to return home;pt's son arrived during last 37min of session;VSS     Pertinent Vitals/ Pain       Pain Assessment: No/denies pain Pain Intervention(s): Monitored during session  Home Living                                          Prior Functioning/Environment              Frequency  Min 2X/week        Progress Toward Goals  OT Goals(current goals can now be found in the care plan section)  Progress towards OT goals: Progressing toward goals  Acute Rehab OT  Goals Patient Stated Goal: to get home once safe enough OT Goal Formulation: With patient Time For Goal Achievement: 06/29/18 Potential to Achieve Goals: Good ADL Goals Pt Will Transfer to Toilet: with modified independence;ambulating;regular height toilet Pt Will Perform Toileting - Clothing Manipulation and hygiene: with modified independence;sit to/from stand Pt Will Perform Tub/Shower Transfer: Shower transfer;with modified independence;ambulating;shower seat;rolling walker;grab bars Additional ADL Goal #1: Pt will gather ADL items and perform ADL with mod I.  Plan Discharge plan remains appropriate    Co-evaluation                 AM-PAC PT "6 Clicks" Daily Activity     Outcome Measure   Help from another person eating meals?: None Help from another person taking care of personal grooming?: A Little Help from another person toileting, which includes using toliet, bedpan, or urinal?: A Little Help from another person bathing (including washing, rinsing, drying)?: A Little Help from another person to put on and taking off regular upper body clothing?: A Little Help from another person to put on and taking off regular lower body clothing?: A Little 6 Click Score: 19    End of Session Equipment Utilized During Treatment: Gait belt  OT Visit Diagnosis: Unsteadiness on feet (R26.81);Other symptoms and signs involving cognitive function   Activity Tolerance Patient tolerated treatment well   Patient Left in chair;with call bell/phone within reach;with family/visitor present;Other (comment)(CSW present)   Nurse Communication Mobility status        Time: 2481-8590 OT Time Calculation (min): 11 min  Charges:    Dorinda Hill OTS     Dorinda Hill 06/16/2018, 11:12 AM

## 2018-06-16 NOTE — Discharge Summary (Signed)
Discharge Summary  Alisha [name] OMV:[id number] DOB: 02-09-1932  PCP: Kelton Pillar, MD  Admit date: 06/10/2018 Discharge date: 06/16/2018  Time spent: 68mins, more than 50% time spent on coordination of care  Recommendations for Outpatient Follow-up:  1. F/u with SNF MD  for hospital discharge follow up, repeat cbc/bmp at follow up. Continue titrate blood pressure meds to achieve blood pressure goal in 1-2 weeks. 2. F/u with neurology   Discharge Diagnoses:  Active Hospital Problems   Diagnosis Date Noted  . Acute metabolic encephalopathy 62/83/6629  . Confusion 06/10/2018  . Hypertension     Resolved Hospital Problems  No resolved problems to display.    Discharge Condition: stable  Diet recommendation: heart healthy/carb modified  Filed Weights   06/11/18 0426 06/16/18 0623  Weight: 69.2 kg (152 lb 8.9 oz) 66.5 kg (146 lb 9.7 oz)    History of present illness: (per admitting MD Dr Shanon Brow) PCP: Kelton Pillar, MD  Patient coming from: Home  Chief Complaint: Confusion  HPI: Alisha [name] is a 84 y.o. female with medical history significant of hypertension, diabetes brought in by family for the second time in the last 24 hours for confusion.  Patient at baseline is normal lives alone and highly functional.  She has not had any fevers.  She has not had any nausea vomiting or diarrhea.  She is been confused going on 48 hours and has not taken any of her blood pressure medications.  Family brought her in yesterday where she had an extensive work-up including MRI of her brain which did not show any stroke and she was sent home.  They brought her back today because she is not any better.  Patient has not been complaining of any pain.  She has not had any shortness of breath or cough.  She has not had any lower extremity edema or swelling.  She is again had repeat studies done today which again are not revealing of what is causing her confusion.  Neurology was called who  recommended this could be due to dehydration and observing overnight.  If she still confused in the morning to consult them again.  Patient is being referred for admission for encephalopathy of unclear etiology.    Hospital Course:  Principal Problem:   Acute metabolic encephalopathy Active Problems:   Hypertension   Confusion  Acute encephalopathy with slurred speech: -possibe hypertensive encephalopathy, possible underline undetected vascular dementia -eeg unremarkable, UA unremarkable, ammonia unremarkable -mri no acute stroke, does has Age-related cerebral atrophy with moderate chronic small vessel ischemic disease -mra+ Short-segment severe stenosis at the right cervical-petrous.Otherwise widely patent anterior circulation.Nonvisualization of the hypoplastic right vertebral artery at the skull base, which may be occluded. Dominant left vertebral artery widely patent as is the remainder of the vertebrobasilar circulation. -case discussed with neurology and interventional radiology, cerebral arteriogram on 7/26  -S/P 4 vessel cerebral arteriogram RT CFA approach. Findings. 1No gross occlusions intracranially or extracranially. 2. Venous outflow WNLs -she is started on thiamine supplement  -mentation has much improved since 7/27 -acute encephalopathy though due to uncontrolled HTN and recent emotional stress ( her best friend at church has heart attack) -likely has underline cognitive impairment, she is to follow up with neurology   Hypokalemia/hypomegnesemia: Replaced, hctz discontinued, her oral intake is marginal    HTN;  Home meds lopressor d/ced,  She is discharged on max dose of coreg, continue cozaar, she is started on imdur snf MD continue titrate bp meds to achieve goal  of normotensive in 1-2 weeks  noninsulin dependent dm2,  a1c 6.2 home meds glipizide and metformin held in the hospital, resumed at discharge,  HLD: continue zetia (h/o statin  allergy)  Anemia of chronic disease: hgb stable at 10  CKD III Cr close to baseline.  H/o breast cancer s/p right mastectomy  She is on chronic iron and b12 supplement  Code Status: full  Family Communication: patient , son at bedside  Disposition Plan: SNF placement  on 7/29 She does not have 24/7 care at home   Consultants:  Case discussed with neurology Dr Aroor  Case discussed with interventional radiology Dr Clinton Sawyer  Procedures:  EEG  Cerebral angiogram  Antibiotics:  none   Discharge Exam: BP (!) 156/73 (BP Location: Left Arm)   Pulse 75   Temp 98.2 F (36.8 C) (Oral)   Resp 18   Ht 5\' 2"  (1.575 m)   Wt 66.5 kg (146 lb 9.7 oz)   SpO2 97%   BMI 26.81 kg/m    General:  NAD, aaox3, retrograde amnesia is improving  Cardiovascular: RRR  Respiratory: CTABL  Abdomen: Soft/ND/NT, positive BS  Musculoskeletal: No Edema  Neuro: alert, aaox3, speech is clear   Discharge Instructions You were cared for by a hospitalist during your hospital stay. If you have any questions about your discharge medications or the care you received while you were in the hospital after you are discharged, you can call the unit and asked to speak with the hospitalist on call if the hospitalist that took care of you is not available. Once you are discharged, your primary care physician will handle any further medical issues. Please note that NO REFILLS for any discharge medications will be authorized once you are discharged, as it is imperative that you return to your primary care physician (or establish a relationship with a primary care physician if you do not have one) for your aftercare needs so that they can reassess your need for medications and monitor your lab values.  Discharge Instructions    Ambulatory referral to Neurology   Complete by:  As directed    An appointment is requested in approximately: 4 weeks For impaired memory, neuropsychology eval    Diet - low sodium heart healthy   Complete by:  As directed    Increase activity slowly   Complete by:  As directed      Allergies as of 06/16/2018      Reactions   Ace Inhibitors Cough   Codeine Nausea Only   Evista [raloxifene Hydrochloride] Other (See Comments)   cramps   Zocor [simvastatin - High Dose] Other (See Comments)   Cramps       Medication List    STOP taking these medications   losartan-hydrochlorothiazide 100-25 MG tablet Commonly known as:  HYZAAR   metoprolol succinate 100 MG 24 hr tablet Commonly known as:  TOPROL-XL     TAKE these medications   aspirin EC 81 MG tablet Take 1 tablet (81 mg total) by mouth daily.   CALCIUM 600 + D 600-200 MG-UNIT Tabs Generic drug:  Calcium Carb-Cholecalciferol Take 1 tablet by mouth every morning.   carvedilol 25 MG tablet Commonly known as:  COREG Take 1 tablet (25 mg total) by mouth 2 (two) times daily with a meal.   cyanocobalamin 1000 MCG/ML injection Commonly known as:  (VITAMIN B-12) Inject 1,000 mcg into the muscle every 3 (three) months.   ferrous sulfate 325 (65 FE) MG tablet Take 1 tablet (  325 mg total) by mouth daily with breakfast. Start taking on:  06/17/2018 What changed:  when to take this   GLIPIZIDE XL 5 MG 24 hr tablet Generic drug:  glipiZIDE Take 5 mg by mouth daily before breakfast.   isosorbide mononitrate 30 MG 24 hr tablet Commonly known as:  IMDUR Take 1 tablet (30 mg total) by mouth daily. Start taking on:  06/17/2018   levothyroxine 112 MCG tablet Commonly known as:  SYNTHROID, LEVOTHROID Take 112 mcg by mouth daily before breakfast.   losartan 100 MG tablet Commonly known as:  COZAAR Take 1 tablet (100 mg total) by mouth daily. Start taking on:  06/17/2018   metFORMIN 850 MG tablet Commonly known as:  GLUCOPHAGE Take 1 tablet (850 mg total) by mouth 2 (two) times daily with a meal. What changed:  when to take this   multivitamin with minerals Tabs tablet Take 1 tablet by  mouth every morning.   senna-docusate 8.6-50 MG tablet Commonly known as:  Senokot-S Take 1 tablet by mouth at bedtime.   thiamine 100 MG tablet Take 1 tablet (100 mg total) by mouth daily. Start taking on:  06/17/2018   UNABLE TO FIND 174.9 Right Mastectomy   L8000- Post Surgical Bras-6  K7425- Silicone Breast Prosthesis-1   ZETIA 10 MG tablet Generic drug:  ezetimibe Take 10 mg by mouth daily before breakfast.      Allergies  Allergen Reactions  . Ace Inhibitors Cough  . Codeine Nausea Only  . Evista [Raloxifene Hydrochloride] Other (See Comments)    cramps   . Zocor [Simvastatin - High Dose] Other (See Comments)    Cramps     Contact information for follow-up providers    Kelton Pillar, MD Follow up in 1 week(s).   Specialty:  Family Medicine Why:  hospital discharge follow up Contact information: [address] [place] [address] [phone]        [facility] Follow up in 3 week(s).   Why:  for confusion, impaired memory Contact information: [address]     [address] [place] [address] [phone]           Contact information for after-discharge care    Destination    [facility] Preferred SNF .   Service:  Skilled Nursing Contact information: [place] [phone]                   The results of significant diagnostics from this hospitalization (including imaging, microbiology, ancillary and laboratory) are listed below for reference.    Significant Diagnostic Studies: Dg Chest 2 View  Result Date: 06/10/2018 CLINICAL DATA:  Altered mental status. EXAM: CHEST - 2 VIEW COMPARISON:  05/11/2014 FINDINGS: Slightly decreased lung volumes. Vascular crowding in the hilar regions. No focal airspace disease or pulmonary edema. Heart size is within normal limits. No large pleural effusions. IMPRESSION: Low lung  volumes without focal chest disease. Electronically Signed   By: Markus Daft M.D.   On: 06/10/2018 21:14   Ct Head Wo Contrast  Result Date: 06/10/2018 CLINICAL DATA:  Altered LOC EXAM: CT HEAD WITHOUT CONTRAST TECHNIQUE: Contiguous axial images were obtained from the base of the skull through the vertex without intravenous contrast. COMPARISON:  None. FINDINGS: Brain: No acute territorial infarction, hemorrhage or intracranial mass. Moderate atrophy. Mild to moderate small vessel ischemic changes of the white matter. Nonenlarged ventricles. Old appearing lacunar infarcts in the basal  ganglia. Vascular: No hyperdense vessels. Vertebral artery and carotid vascular calcification Skull: Normal. Negative for fracture or focal lesion. Sinuses/Orbits: No acute finding. Other: None IMPRESSION: 1. No CT evidence for acute intracranial abnormality. 2. Atrophy and small vessel ischemic changes of the white matter. Electronically Signed   By: Donavan Foil M.D.   On: 06/10/2018 01:28   Mr Jodene Nam Head Wo Contrast  Result Date: 06/10/2018 CLINICAL DATA:  Initial evaluation for acute stroke.  Dysarthria. EXAM: MRI HEAD WITHOUT CONTRAST MRA HEAD WITHOUT CONTRAST TECHNIQUE: Multiplanar, multiecho pulse sequences of the brain and surrounding structures were obtained without intravenous contrast. Angiographic images of the head were obtained using MRA technique without contrast. COMPARISON:  Prior CT from earlier the same day. FINDINGS: MRI HEAD FINDINGS Brain: Examination moderately degraded by motion artifact. Generalized cerebral atrophy. Advanced chronic microvascular ischemic disease seen involving the supratentorial cerebral white matter. Prominent perivascular spaces present within the bilateral basal ganglia extending into the midbrain/pons. Few small remote lacunar infarct present at the right thalamus/right cerebral peduncle. No abnormal foci of restricted diffusion to suggest acute or subacute ischemia. Gray-white  matter differentiation maintained. No evidence for acute intracranial hemorrhage. Few small chronic micro hemorrhages noted within the right thalamus. No mass lesion, midline shift or mass effect. No hydrocephalus. No extra-axial fluid collection. Pituitary gland grossly unremarkable. Vascular: Right vertebral artery likely hypoplastic and not well visualized. Major intracranial vascular flow voids otherwise maintained. Intracranial circulation somewhat dolichoectatic in appearance. Skull and upper cervical spine: Craniocervical junction unremarkable. Bone marrow signal intensity within normal limits. No scalp soft tissue abnormality. Sinuses/Orbits: Globes and orbital soft tissues demonstrate no acute finding. Patient status post ocular lens replacement bilaterally. Paranasal sinuses are largely clear. Trace opacity right mastoid air cells, of doubtful significance. Other: None. MRA HEAD FINDINGS ANTERIOR CIRCULATION: Examination markedly degraded by motion artifact. Distal cervical segments of the internal carotid arteries are patent with antegrade flow. There is an apparent short-segment severe stenosis at the right cervical petrous junction (series 10, image 51). Petrous segments patent bilaterally. Cavernous and supraclinoid segments patent without appreciable stenosis. Origin of the ophthalmic arteries patent. ICA termini widely patent. A1 segments patent bilaterally. Left A1 appears to be partially duplicated. Grossly normal anterior communicating artery. Anterior cerebral arteries patent to their distal aspects without obvious stenosis. M1 segments patent without stenosis. Grossly normal MCA bifurcations. Probable short-segment mild to moderate proximal left M2 stenosis, superior division (series 1034, image 1). Distal MCA branches perfused and fairly symmetric POSTERIOR CIRCULATION: Dominant left vertebral artery patent to the vertebrobasilar junction without stenosis. Partially visualized left PICA patent.  Hypoplastic right vertebral artery visualized within the distal neck, but not visualized at the level of the foramen magnum, and may be occluded. Minimal flow within the distal right V4 segment at the vertebrobasilar junction, which may in part be due to collateralization. Right PICA faintly visualized. Basilar patent to its distal aspect without stenosis. Superior cerebral arteries patent bilaterally. Right PCA supplied via the basilar. Left PCA supplied via the basilar as well as a prominent posterior communicating artery. PCAs patent to their distal aspects without appreciable flow-limiting stenosis. Evaluation for aneurysm somewhat limited on this motion degraded exam. No obvious aneurysm identified. Irregular blush of contrast seen adjacent to the cavernous ICAs favored to be artifactual due to motion (series 10, image 98). IMPRESSION: MRI HEAD IMPRESSION: 1. Moderately motion degraded examination. 2. No acute intracranial infarct or other abnormality identified. 3. Age-related cerebral atrophy with moderate chronic small vessel ischemic disease. MRA HEAD  IMPRESSION: 1. Moderately motion degraded examination. 2. Short-segment severe stenosis at the right cervical-petrous junction. Otherwise widely patent anterior circulation. 3. Nonvisualization of the hypoplastic right vertebral artery at the skull base, which may be occluded. Dominant left vertebral artery widely patent as is the remainder of the vertebrobasilar circulation. Electronically Signed   By: Jeannine Boga M.D.   On: 06/10/2018 07:31   Mr Brain Wo Contrast  Result Date: 06/10/2018 CLINICAL DATA:  Initial evaluation for acute stroke.  Dysarthria. EXAM: MRI HEAD WITHOUT CONTRAST MRA HEAD WITHOUT CONTRAST TECHNIQUE: Multiplanar, multiecho pulse sequences of the brain and surrounding structures were obtained without intravenous contrast. Angiographic images of the head were obtained using MRA technique without contrast. COMPARISON:  Prior CT  from earlier the same day. FINDINGS: MRI HEAD FINDINGS Brain: Examination moderately degraded by motion artifact. Generalized cerebral atrophy. Advanced chronic microvascular ischemic disease seen involving the supratentorial cerebral white matter. Prominent perivascular spaces present within the bilateral basal ganglia extending into the midbrain/pons. Few small remote lacunar infarct present at the right thalamus/right cerebral peduncle. No abnormal foci of restricted diffusion to suggest acute or subacute ischemia. Gray-white matter differentiation maintained. No evidence for acute intracranial hemorrhage. Few small chronic micro hemorrhages noted within the right thalamus. No mass lesion, midline shift or mass effect. No hydrocephalus. No extra-axial fluid collection. Pituitary gland grossly unremarkable. Vascular: Right vertebral artery likely hypoplastic and not well visualized. Major intracranial vascular flow voids otherwise maintained. Intracranial circulation somewhat dolichoectatic in appearance. Skull and upper cervical spine: Craniocervical junction unremarkable. Bone marrow signal intensity within normal limits. No scalp soft tissue abnormality. Sinuses/Orbits: Globes and orbital soft tissues demonstrate no acute finding. Patient status post ocular lens replacement bilaterally. Paranasal sinuses are largely clear. Trace opacity right mastoid air cells, of doubtful significance. Other: None. MRA HEAD FINDINGS ANTERIOR CIRCULATION: Examination markedly degraded by motion artifact. Distal cervical segments of the internal carotid arteries are patent with antegrade flow. There is an apparent short-segment severe stenosis at the right cervical petrous junction (series 10, image 51). Petrous segments patent bilaterally. Cavernous and supraclinoid segments patent without appreciable stenosis. Origin of the ophthalmic arteries patent. ICA termini widely patent. A1 segments patent bilaterally. Left A1 appears  to be partially duplicated. Grossly normal anterior communicating artery. Anterior cerebral arteries patent to their distal aspects without obvious stenosis. M1 segments patent without stenosis. Grossly normal MCA bifurcations. Probable short-segment mild to moderate proximal left M2 stenosis, superior division (series 1034, image 1). Distal MCA branches perfused and fairly symmetric POSTERIOR CIRCULATION: Dominant left vertebral artery patent to the vertebrobasilar junction without stenosis. Partially visualized left PICA patent. Hypoplastic right vertebral artery visualized within the distal neck, but not visualized at the level of the foramen magnum, and may be occluded. Minimal flow within the distal right V4 segment at the vertebrobasilar junction, which may in part be due to collateralization. Right PICA faintly visualized. Basilar patent to its distal aspect without stenosis. Superior cerebral arteries patent bilaterally. Right PCA supplied via the basilar. Left PCA supplied via the basilar as well as a prominent posterior communicating artery. PCAs patent to their distal aspects without appreciable flow-limiting stenosis. Evaluation for aneurysm somewhat limited on this motion degraded exam. No obvious aneurysm identified. Irregular blush of contrast seen adjacent to the cavernous ICAs favored to be artifactual due to motion (series 10, image 98). IMPRESSION: MRI HEAD IMPRESSION: 1. Moderately motion degraded examination. 2. No acute intracranial infarct or other abnormality identified. 3. Age-related cerebral atrophy with moderate chronic small vessel  ischemic disease. MRA HEAD IMPRESSION: 1. Moderately motion degraded examination. 2. Short-segment severe stenosis at the right cervical-petrous junction. Otherwise widely patent anterior circulation. 3. Nonvisualization of the hypoplastic right vertebral artery at the skull base, which may be occluded. Dominant left vertebral artery widely patent as is the  remainder of the vertebrobasilar circulation. Electronically Signed   By: Jeannine Boga M.D.   On: 06/10/2018 07:31   Ir Angio Extracran Sel Com Carotid Innominate Uni Bilat Mod Sed  Result Date: 06/16/2018 CLINICAL DATA:  Sudden confusional state. Right carotid severe stenosis on MRA examination. EXAM: IR ANGIO EXTRACRAN SELECT COM CAROTID INNOMINATE BILAT MOD SED; IR ANGIO VERTEBRAL SEL VERTEBRAL UNI LEFT MOD SED; IR ANGIO VERTEBRAL SEL SUBCLAVIAN INNOMINATE UNI RIGHT MOD SED COMPARISON:  MRI MRA of the brain of 06/10/2018. MEDICATIONS: Heparin 1000 units IV; no antibiotic was administered within 1 hour of the procedure. ANESTHESIA/SEDATION: Versed 0 mg IV; Fentanyl 25 mcg IV Moderate Sedation Time:  21 minutes The patient was continuously monitored during the procedure by the interventional radiology nurse under my direct supervision. CONTRAST:  Isovue 300 approximately 50 cc. FLUOROSCOPY TIME:  Fluoroscopy Time: 5 minutes 48 seconds (520 mGy). COMPLICATIONS: None immediate. TECHNIQUE: Informed written consent was obtained from the patient after a thorough discussion of the procedural risks, benefits and alternatives. All questions were addressed. Maximal Sterile Barrier Technique was utilized including caps, mask, sterile gowns, sterile gloves, sterile drape, hand hygiene and skin antiseptic. A timeout was performed prior to the initiation of the procedure. The right groin was prepped and draped in the usual sterile fashion. Thereafter using modified Seldinger technique, transfemoral access into the right common femoral artery was obtained without difficulty. Over a 0.035 inch guidewire, a 5 French Pinnacle sheath was inserted. Through this, and also over 0.035 inch guidewire, a 5 [place] JB 1 catheter was advanced to the aortic arch region and selectively positioned in the innominate artery, the left common carotid artery and the left vertebral artery. FINDINGS: The innominate artery arteriogram  demonstrates the right subclavian artery and the right common carotid artery to be widely patent. A hypoplastic right vertebral artery is seen to meander in a tortuous fashion to the cranial skull base. The tortuosity appears to be worse in the upper cervical levels at C2-C3, C3-C4. Flow is noted in the right vertebrobasilar junction. The right common carotid arteriogram demonstrates the right external carotid artery and its major branches to be widely patent. The right internal carotid artery at the bulb to the cranial skull base demonstrates wide patency without evidence of severe stenosis in the upper cervical 1/3 of the right internal carotid artery. The petrous, the cavernous and the supraclinoid segments are widely patent. There is mild circumferential narrowing of the right internal carotid artery in the supraclinoid segment distal to the origin of the ophthalmic artery. The right middle cerebral artery and the right anterior cerebral artery opacify into the capillary and venous phases. There is mild focal stenosis of the mid to distal 1/3 of the M1 segment of the right middle cerebral artery. The left common carotid arteriogram demonstrates the left external carotid artery and its major branches to be widely patent. A left internal carotid artery at the bulb to the cranial skull base opacifies widely. The petrous, cavernous, and the supraclinoid left ICA are widely patent. A left posterior communicating artery is seen opacifying the left posterior cerebral artery distribution. The left middle cerebral artery and the left anterior cerebral artery opacify into the capillary and  venous phases. Again demonstrated are mild focal areas of narrowing involving the middle 1/3, the distal 1/3 of the left middle cerebral artery, and also of the left anterior cerebral artery distal M1 segment. Focal areas of mild caliber irregularity are also seen involving the pericallosal branches of the left anterior cerebral artery.  The left vertebral artery origin appears to be mildly narrowed. Just distal to this there moderate tortuosity without evidence of kinking. More distally, the left vertebral artery demonstrates a moderately-severe tortuous course with mild fusiform dilatation in its entirety to the cranial skull base. The left vertebrobasilar junction and the left posterior-inferior cerebellar artery demonstrate wide patency with mild focal areas of caliber irregularity involving the proximal 1/3 of the left posterior-inferior cerebellar artery. The basilar artery, the opacified portion of the posterior cerebral arteries, the superior cerebellar arteries and the anterior-inferior cerebellar arteries demonstrate wide patency, with progression into the capillary and venous phases. IMPRESSION: No angio evidence of high-grade stenosis of the right internal carotid artery in its distal 1/3. Focal areas of caliber irregularity involving the middle cerebral arteries, the left anterior cerebral artery pericallosal regions, and the left posterior-inferior cerebellar artery in its proximal 1/3. These findings are nonspecific. Differential considerations are arteriosclerotic intracranial disease, versus spasm versus vasculitis. PLAN: As per referring physician. Electronically Signed   By: Luanne Bras M.D.   On: 06/13/2018 12:34   Ir Angio Vertebral Sel Subclavian Innominate Uni R Mod Sed  Result Date: 06/16/2018 CLINICAL DATA:  Sudden confusional state. Right carotid severe stenosis on MRA examination. EXAM: IR ANGIO EXTRACRAN SELECT COM CAROTID INNOMINATE BILAT MOD SED; IR ANGIO VERTEBRAL SEL VERTEBRAL UNI LEFT MOD SED; IR ANGIO VERTEBRAL SEL SUBCLAVIAN INNOMINATE UNI RIGHT MOD SED COMPARISON:  MRI MRA of the brain of 06/10/2018. MEDICATIONS: Heparin 1000 units IV; no antibiotic was administered within 1 hour of the procedure. ANESTHESIA/SEDATION: Versed 0 mg IV; Fentanyl 25 mcg IV Moderate Sedation Time:  21 minutes The patient was  continuously monitored during the procedure by the interventional radiology nurse under my direct supervision. CONTRAST:  Isovue 300 approximately 50 cc. FLUOROSCOPY TIME:  Fluoroscopy Time: 5 minutes 48 seconds (520 mGy). COMPLICATIONS: None immediate. TECHNIQUE: Informed written consent was obtained from the patient after a thorough discussion of the procedural risks, benefits and alternatives. All questions were addressed. Maximal Sterile Barrier Technique was utilized including caps, mask, sterile gowns, sterile gloves, sterile drape, hand hygiene and skin antiseptic. A timeout was performed prior to the initiation of the procedure. The right groin was prepped and draped in the usual sterile fashion. Thereafter using modified Seldinger technique, transfemoral access into the right common femoral artery was obtained without difficulty. Over a 0.035 inch guidewire, a 5 French Pinnacle sheath was inserted. Through this, and also over 0.035 inch guidewire, a 5 [place] JB 1 catheter was advanced to the aortic arch region and selectively positioned in the innominate artery, the left common carotid artery and the left vertebral artery. FINDINGS: The innominate artery arteriogram demonstrates the right subclavian artery and the right common carotid artery to be widely patent. A hypoplastic right vertebral artery is seen to meander in a tortuous fashion to the cranial skull base. The tortuosity appears to be worse in the upper cervical levels at C2-C3, C3-C4. Flow is noted in the right vertebrobasilar junction. The right common carotid arteriogram demonstrates the right external carotid artery and its major branches to be widely patent. The right internal carotid artery at the bulb to the cranial skull base demonstrates  wide patency without evidence of severe stenosis in the upper cervical 1/3 of the right internal carotid artery. The petrous, the cavernous and the supraclinoid segments are widely patent. There is mild  circumferential narrowing of the right internal carotid artery in the supraclinoid segment distal to the origin of the ophthalmic artery. The right middle cerebral artery and the right anterior cerebral artery opacify into the capillary and venous phases. There is mild focal stenosis of the mid to distal 1/3 of the M1 segment of the right middle cerebral artery. The left common carotid arteriogram demonstrates the left external carotid artery and its major branches to be widely patent. A left internal carotid artery at the bulb to the cranial skull base opacifies widely. The petrous, cavernous, and the supraclinoid left ICA are widely patent. A left posterior communicating artery is seen opacifying the left posterior cerebral artery distribution. The left middle cerebral artery and the left anterior cerebral artery opacify into the capillary and venous phases. Again demonstrated are mild focal areas of narrowing involving the middle 1/3, the distal 1/3 of the left middle cerebral artery, and also of the left anterior cerebral artery distal M1 segment. Focal areas of mild caliber irregularity are also seen involving the pericallosal branches of the left anterior cerebral artery. The left vertebral artery origin appears to be mildly narrowed. Just distal to this there moderate tortuosity without evidence of kinking. More distally, the left vertebral artery demonstrates a moderately-severe tortuous course with mild fusiform dilatation in its entirety to the cranial skull base. The left vertebrobasilar junction and the left posterior-inferior cerebellar artery demonstrate wide patency with mild focal areas of caliber irregularity involving the proximal 1/3 of the left posterior-inferior cerebellar artery. The basilar artery, the opacified portion of the posterior cerebral arteries, the superior cerebellar arteries and the anterior-inferior cerebellar arteries demonstrate wide patency, with progression into the capillary  and venous phases. IMPRESSION: No angio evidence of high-grade stenosis of the right internal carotid artery in its distal 1/3. Focal areas of caliber irregularity involving the middle cerebral arteries, the left anterior cerebral artery pericallosal regions, and the left posterior-inferior cerebellar artery in its proximal 1/3. These findings are nonspecific. Differential considerations are arteriosclerotic intracranial disease, versus spasm versus vasculitis. PLAN: As per referring physician. Electronically Signed   By: Luanne Bras M.D.   On: 06/13/2018 12:34   Ir Angio Vertebral Sel Vertebral Uni L Mod Sed  Result Date: 06/16/2018 CLINICAL DATA:  Sudden confusional state. Right carotid severe stenosis on MRA examination. EXAM: IR ANGIO EXTRACRAN SELECT COM CAROTID INNOMINATE BILAT MOD SED; IR ANGIO VERTEBRAL SEL VERTEBRAL UNI LEFT MOD SED; IR ANGIO VERTEBRAL SEL SUBCLAVIAN INNOMINATE UNI RIGHT MOD SED COMPARISON:  MRI MRA of the brain of 06/10/2018. MEDICATIONS: Heparin 1000 units IV; no antibiotic was administered within 1 hour of the procedure. ANESTHESIA/SEDATION: Versed 0 mg IV; Fentanyl 25 mcg IV Moderate Sedation Time:  21 minutes The patient was continuously monitored during the procedure by the interventional radiology nurse under my direct supervision. CONTRAST:  Isovue 300 approximately 50 cc. FLUOROSCOPY TIME:  Fluoroscopy Time: 5 minutes 48 seconds (520 mGy). COMPLICATIONS: None immediate. TECHNIQUE: Informed written consent was obtained from the patient after a thorough discussion of the procedural risks, benefits and alternatives. All questions were addressed. Maximal Sterile Barrier Technique was utilized including caps, mask, sterile gowns, sterile gloves, sterile drape, hand hygiene and skin antiseptic. A timeout was performed prior to the initiation of the procedure. The right groin was prepped and draped  in the usual sterile fashion. Thereafter using modified Seldinger technique,  transfemoral access into the right common femoral artery was obtained without difficulty. Over a 0.035 inch guidewire, a 5 French Pinnacle sheath was inserted. Through this, and also over 0.035 inch guidewire, a 5 [place] JB 1 catheter was advanced to the aortic arch region and selectively positioned in the innominate artery, the left common carotid artery and the left vertebral artery. FINDINGS: The innominate artery arteriogram demonstrates the right subclavian artery and the right common carotid artery to be widely patent. A hypoplastic right vertebral artery is seen to meander in a tortuous fashion to the cranial skull base. The tortuosity appears to be worse in the upper cervical levels at C2-C3, C3-C4. Flow is noted in the right vertebrobasilar junction. The right common carotid arteriogram demonstrates the right external carotid artery and its major branches to be widely patent. The right internal carotid artery at the bulb to the cranial skull base demonstrates wide patency without evidence of severe stenosis in the upper cervical 1/3 of the right internal carotid artery. The petrous, the cavernous and the supraclinoid segments are widely patent. There is mild circumferential narrowing of the right internal carotid artery in the supraclinoid segment distal to the origin of the ophthalmic artery. The right middle cerebral artery and the right anterior cerebral artery opacify into the capillary and venous phases. There is mild focal stenosis of the mid to distal 1/3 of the M1 segment of the right middle cerebral artery. The left common carotid arteriogram demonstrates the left external carotid artery and its major branches to be widely patent. A left internal carotid artery at the bulb to the cranial skull base opacifies widely. The petrous, cavernous, and the supraclinoid left ICA are widely patent. A left posterior communicating artery is seen opacifying the left posterior cerebral artery distribution. The  left middle cerebral artery and the left anterior cerebral artery opacify into the capillary and venous phases. Again demonstrated are mild focal areas of narrowing involving the middle 1/3, the distal 1/3 of the left middle cerebral artery, and also of the left anterior cerebral artery distal M1 segment. Focal areas of mild caliber irregularity are also seen involving the pericallosal branches of the left anterior cerebral artery. The left vertebral artery origin appears to be mildly narrowed. Just distal to this there moderate tortuosity without evidence of kinking. More distally, the left vertebral artery demonstrates a moderately-severe tortuous course with mild fusiform dilatation in its entirety to the cranial skull base. The left vertebrobasilar junction and the left posterior-inferior cerebellar artery demonstrate wide patency with mild focal areas of caliber irregularity involving the proximal 1/3 of the left posterior-inferior cerebellar artery. The basilar artery, the opacified portion of the posterior cerebral arteries, the superior cerebellar arteries and the anterior-inferior cerebellar arteries demonstrate wide patency, with progression into the capillary and venous phases. IMPRESSION: No angio evidence of high-grade stenosis of the right internal carotid artery in its distal 1/3. Focal areas of caliber irregularity involving the middle cerebral arteries, the left anterior cerebral artery pericallosal regions, and the left posterior-inferior cerebellar artery in its proximal 1/3. These findings are nonspecific. Differential considerations are arteriosclerotic intracranial disease, versus spasm versus vasculitis. PLAN: As per referring physician. Electronically Signed   By: Luanne Bras M.D.   On: 06/13/2018 12:34    Microbiology: No results found for this or any previous visit (from the past 240 hour(s)).   Labs: Basic Metabolic Panel: Recent Labs  Lab 06/12/18 0508 06/13/18 1411  06/14/18 0339 06/15/18 0714 06/16/18 0608  NA 135 132* 133* 135 136  K 3.6 3.4* 4.0 4.1 4.1  CL 101 100 100 101 100  CO2 26 23 22 26 27   GLUCOSE 91 113* 102* 116* 108*  BUN 12 13 13 17 20   CREATININE 1.27* 1.10* 1.16* 1.36* 1.48*  CALCIUM 8.3* 8.2* 8.3* 8.7* 8.5*  MG 1.5* 1.7 2.3  --   --    Liver Function Tests: Recent Labs  Lab 06/10/18 0026 06/10/18 2204  AST 17 16  ALT 16 13  ALKPHOS 40 31*  BILITOT 0.6 0.8  PROT 6.8 4.9*  ALBUMIN 3.8 2.7*   No results for input(s): LIPASE, AMYLASE in the last 168 hours. Recent Labs  Lab 06/10/18 2133  AMMONIA 12   CBC: Recent Labs  Lab 06/10/18 0026  06/10/18 1551 06/10/18 1658 06/11/18 0617 06/12/18 0508 06/13/18 1411  WBC 8.6  --  8.9  --  8.2 8.0 10.4  NEUTROABS 6.2  --  7.1  --   --   --   --   HGB 11.2*   < > 11.4* 11.6* 10.4* 10.3* 10.7*  HCT 35.6*   < > 36.3 34.0* 32.2* 32.2* 33.2*  MCV 87.7  --  87.3  --  87.3 87.0 85.8  PLT 238  --  258  --  208 200 237   < > = values in this interval not displayed.   Cardiac Enzymes: No results for input(s): CKTOTAL, CKMB, CKMBINDEX, TROPONINI in the last 168 hours. BNP: BNP (last 3 results) No results for input(s): BNP in the last 8760 hours.  ProBNP (last 3 results) No results for input(s): PROBNP in the last 8760 hours.  CBG: Recent Labs  Lab 06/13/18 0746 06/13/18 1647 06/14/18 0748 06/15/18 0801 06/16/18 0753  GLUCAP 123* 120* 107* 115* 103*       Signed:  Florencia Reasons MD, PhD  [facility] 06/16/2018, 10:54 AM

## 2018-06-16 NOTE — Progress Notes (Signed)
OT Note Addendum for Charges    06/16/18 1124  OT Visit Information  Last OT Received On 06/16/18  OT General Charges  $OT Visit 1 Visit  OT Treatments  $Self Care/Home Management  8-22 mins   Mel Almond A. Ulice Brilliant, M.S., OTR/L Acute Rehab Department: [phone]

## 2018-06-16 NOTE — Clinical Social Work Placement (Signed)
Nurse to call report to [phone], Room 205    CLINICAL SOCIAL WORK PLACEMENT  NOTE  Date:  06/16/2018  Patient Details  Name: Alisha [name] MRN: [id number] Date of Birth: 1932-06-28  Clinical Social Work is seeking post-discharge placement for this patient at the [place] level of care (*CSW will initial, date and re-position this form in  chart as items are completed):  Yes   Patient/family provided with [place] Work Department's list of facilities offering this level of care within the geographic area requested by the patient (or if unable, by the patient's family).  Yes   Patient/family informed of their freedom to choose among providers that offer the needed level of care, that participate in Medicare, Medicaid or managed care program needed by the patient, have an available bed and are willing to accept the patient.  Yes   Patient/family informed of [facility]'s ownership interest in [facility] and [facility], as well as of the fact that they are under no obligation to receive care at these facilities.  PASRR submitted to EDS on       PASRR number received on       Existing PASRR number confirmed on 06/13/18     FL2 transmitted to all facilities in geographic area requested by pt/family on 06/13/18     FL2 transmitted to all facilities within larger geographic area on       Patient informed that his/her managed care company has contracts with or will negotiate with certain facilities, including the following:        Yes   Patient/family informed of bed offers received.  Patient chooses bed at [place], [place]     Physician recommends and patient chooses bed at      Patient to be transferred to [place], [place] on 06/16/18.  Patient to be transferred to facility by Family     Patient family notified on 06/16/18 of transfer.  Name of family member notified:  Aaron Edelman     PHYSICIAN Please prepare priority  discharge summary, including medications     Additional Comment:    _______________________________________________ Geralynn Ochs, LCSW 06/16/2018, 10:46 AM

## 2018-06-17 DIAGNOSIS — I10 Essential (primary) hypertension: Secondary | ICD-10-CM | POA: Diagnosis not present

## 2018-06-17 DIAGNOSIS — E786 Lipoprotein deficiency: Secondary | ICD-10-CM | POA: Diagnosis not present

## 2018-06-17 DIAGNOSIS — R42 Dizziness and giddiness: Secondary | ICD-10-CM | POA: Diagnosis not present

## 2018-06-17 DIAGNOSIS — E119 Type 2 diabetes mellitus without complications: Secondary | ICD-10-CM | POA: Diagnosis not present

## 2018-06-17 DIAGNOSIS — G9341 Metabolic encephalopathy: Secondary | ICD-10-CM | POA: Diagnosis not present

## 2018-06-17 DIAGNOSIS — E538 Deficiency of other specified B group vitamins: Secondary | ICD-10-CM | POA: Diagnosis not present

## 2018-06-17 NOTE — Consult Note (Signed)
            [facility] CM Primary Care Navigator  06/17/2018  Alisha [name] December 11, 1931 [id number]   Went to see patientat the bedsideto identify possible discharge needs butshe wasalreadydischarge per staff.  Patientwas discharged to skilled nursing facility per therapy recommendation ([facility]).  Per chart review,patientpresented with confusion and being referred for admission due to encephalopathy of unclear etiology (acute metabolic encephalopathy, hypertension)  Primary care provider's office is listed as providing transition of care (TOC) follow-up.  Patient has discharge instruction to follow-up withprimary care provider in 1 week and neurology follow-up in 3 weeks.   For additional questions please contact:  Edwena Felty A. Agam Tuohy, BSN, RN-BC [facility] PRIMARY CARE Navigator Cell: [phone]

## 2018-06-26 ENCOUNTER — Encounter (HOSPITAL_COMMUNITY): Payer: Medicare Other

## 2018-07-01 ENCOUNTER — Encounter: Payer: Self-pay | Admitting: *Deleted

## 2018-07-02 ENCOUNTER — Encounter: Payer: Self-pay | Admitting: Neurology

## 2018-07-02 ENCOUNTER — Ambulatory Visit (INDEPENDENT_AMBULATORY_CARE_PROVIDER_SITE_OTHER): Payer: Medicare Other | Admitting: Neurology

## 2018-07-02 VITALS — BP 136/79 | HR 67 | Ht 62.0 in | Wt 153.0 lb

## 2018-07-02 DIAGNOSIS — G9341 Metabolic encephalopathy: Secondary | ICD-10-CM | POA: Diagnosis not present

## 2018-07-02 DIAGNOSIS — I10 Essential (primary) hypertension: Secondary | ICD-10-CM | POA: Diagnosis not present

## 2018-07-02 DIAGNOSIS — R410 Disorientation, unspecified: Secondary | ICD-10-CM | POA: Diagnosis not present

## 2018-07-02 DIAGNOSIS — I679 Cerebrovascular disease, unspecified: Secondary | ICD-10-CM

## 2018-07-02 DIAGNOSIS — E538 Deficiency of other specified B group vitamins: Secondary | ICD-10-CM | POA: Diagnosis not present

## 2018-07-02 DIAGNOSIS — E611 Iron deficiency: Secondary | ICD-10-CM | POA: Diagnosis not present

## 2018-07-02 DIAGNOSIS — E119 Type 2 diabetes mellitus without complications: Secondary | ICD-10-CM | POA: Diagnosis not present

## 2018-07-02 NOTE — Progress Notes (Signed)
PATIENT: Alisha [name] DOB: 1932/03/19  Chief Complaint  Patient presents with  . Transient Ischemic Attack    She is here with her son, Alisha [name].  She presented to ED, on 06/10/18, with acute confusion and slurred speech.  She underwent CT head, MRI brain and MRA head. They would like to review her scans in more detail.  Her symptoms were present for approximately one week before she returned to baseline.  She was started on aspirin 81mg  daily.    Marland Kitchen PCP    Kelton Pillar, MD     HISTORICAL  Alisha [name] is a 84 years old female, seen in request by her primary care physician Dr. Laurann Montana, Margaretha Sheffield for evaluation of TIA, initial evaluation was on July 02, 2018,  She had past medical history of hypertension, diabetes since 2002, osteoporosis, history of right breast cancer, iron deficiency anemia, required iron infusion in the past, hypothyroidism, history of vitamin B12 deficiency, is receiving intramuscular B12 supplement.  She lives by herself, independent, has gait abnormality due to previous history of left hip fracture, rely on her cane, but driving, and her personal needs, her son Alisha [name] call her on a daily basis,  On June 09 2018, when Alisha [name] called on her, noticed she has mild slurred speech, confusion, also complains of headaches, patient remembers she had quite stressful experience at church on Sunday, June 08, 2018, woke up Monday morning July 22 complains of vertex area pressure headache, as day goes by, she had increased headaches, she denies a previous history of headaches, one brain check, later that evening, she was noted to have increased confusion, slurred speech, also noted to have elevated blood pressure, after overnight sleep woke up on June 10, 2018, she was noted to have even worsening symptoms, was taken to the emergency room, blood pressure documented 180/100,  She had extensive evaluations, we personally reviewed the data,  MRI of the brain showed generalized atrophy  supratentorium small vessel disease no acute abnormality,  Initial MRI agrees the concern of short segment severe stenosis at right cervical petrous junction, hypoplasia of right vertebral artery,  This has led to four-vessel angiogram on June 16, 2018,no evidence of high-grade stenosis at the right internal carotid artery in its distal one third, intracranial atherosclerotic disease,  Laboratory evaluation showed LDL of 86, A1c of 6.3, normal TSH 0.47, creatinine of 1.48, B12 was more than 1500, no evidence of urinary tract or pulmonary infection,  The conclusion of her hospital stay is acute metabolic encephalopathy likely due to hypertension, She was put on aspirin 81 mg daily  She was discharged to inpatient rehabilitation, made slow recovery, now back to home, almost recovered to baseline, she denies history of memory loss, family history of memory loss, today's Mini-Mental status examination is 30 out of 30,  Echocardiogram in May 2018, ejection fraction 55 to 60%, wall motion was normal  EEG on June 11, 2018 was normal  Laboratory evaluation showed   REVIEW OF SYSTEMS: Full 14 system review of systems performed and notable only for weakness  ALLERGIES: Allergies  Allergen Reactions  . Ace Inhibitors Cough  . Codeine Nausea Only  . Evista [Raloxifene Hydrochloride] Other (See Comments)    cramps   . Zocor [Simvastatin - High Dose] Other (See Comments)    Cramps     HOME MEDICATIONS: Current Outpatient Medications  Medication Sig Dispense Refill  . aspirin EC 81 MG tablet Take 1 tablet (81 mg total) by mouth daily. [place]  tablet 3  . Calcium Carb-Cholecalciferol (CALCIUM 600 + D) 600-200 MG-UNIT TABS Take 1 tablet by mouth every morning.     . carvedilol (COREG) 25 MG tablet Take 1 tablet (25 mg total) by mouth 2 (two) times daily with a meal. 60 tablet 0  . cyanocobalamin (,VITAMIN B-12,) 1000 MCG/ML injection Inject 1,000 mcg into the muscle every 3 (three) months.    .  ferrous sulfate 325 (65 FE) MG tablet Take 1 tablet (325 mg total) by mouth daily with breakfast. 30 tablet 0  . GLIPIZIDE XL 5 MG 24 hr tablet Take 5 mg by mouth daily before breakfast.     . isosorbide mononitrate (IMDUR) 30 MG 24 hr tablet Take 1 tablet (30 mg total) by mouth daily. 30 tablet 0  . levothyroxine (SYNTHROID, LEVOTHROID) 112 MCG tablet Take 112 mcg by mouth daily before breakfast.     . losartan (COZAAR) 100 MG tablet Take 1 tablet (100 mg total) by mouth daily. 30 tablet 0  . metFORMIN (GLUCOPHAGE) 850 MG tablet Take 1 tablet (850 mg total) by mouth 2 (two) times daily with a meal. 60 tablet 0  . Multiple Vitamin (MULTIVITAMIN WITH MINERALS) TABS tablet Take 1 tablet by mouth every morning.    . senna-docusate (SENOKOT-S) 8.6-50 MG tablet Take 1 tablet by mouth at bedtime. 30 tablet 0  . thiamine 100 MG tablet Take 1 tablet (100 mg total) by mouth daily. 30 tablet 0  . UNABLE TO FIND 174.9 Right Mastectomy   L8000- Post Surgical Bras-6  V7616- Silicone Breast Prosthesis-1 1 each 0  . ZETIA 10 MG tablet Take 10 mg by mouth daily before breakfast.      No current facility-administered medications for this visit.     PAST MEDICAL HISTORY: Past Medical History:  Diagnosis Date  . Anemia    takes Ferrous Sulfate daily  . Arthritis   . B12 deficiency    takes Vit B12 Q3 months  . Blood transfusion   . Colon polyps   . Diabetes mellitus    takes Metformin and Glipizide daily  . Hemorrhoids   . History of blood transfusion    no abnormal reaction noted  . Hyperlipidemia    takes Zetia daily  . Hypertension    takes Diovan and Metoprolol daily  . Joint pain   . Multiple thyroid nodules    takes Synthroid daily  . Osteoporosis    takes Fosamax every Wed  . Peripheral vascular disease ([place])    DVT  2008 with ? anticoagulation  . PONV (postoperative nausea and vomiting)     PAST SURGICAL HISTORY: Past Surgical History:  Procedure Laterality Date  . COLONOSCOPY  WITH PROPOFOL N/A 12/06/2014   Procedure: COLONOSCOPY WITH PROPOFOL;  Surgeon: Garlan Fair, MD;  Location: [facility];  Service: Endoscopy;  Laterality: N/A;  . ESOPHAGOGASTRODUODENOSCOPY (EGD) WITH PROPOFOL N/A 12/06/2014   Procedure: ESOPHAGOGASTRODUODENOSCOPY (EGD) WITH PROPOFOL;  Surgeon: Garlan Fair, MD;  Location: [facility];  Service: Endoscopy;  Laterality: N/A;  . FEMUR SURGERY Left 07/08/11   plates and screws  . FRACTURE SURGERY     8/12 femur fx with rod and bolt placed  . HEMORRHOIDECTOMY WITH HEMORRHOID BANDING    . HERNIA REPAIR  1945  . IR ANGIO EXTRACRAN SEL COM CAROTID INNOMINATE UNI BILAT MOD SED  06/13/2018  . IR ANGIO VERTEBRAL SEL SUBCLAVIAN INNOMINATE UNI R MOD SED  06/13/2018  . IR ANGIO VERTEBRAL SEL VERTEBRAL UNI L MOD SED  06/13/2018  . MASTECTOMY W/ SENTINEL NODE BIOPSY Right 05/13/2014   Procedure: MASTECTOMY WITH SENTINEL LYMPH NODE BIOPSY;  Surgeon: Stark Klein, MD;  Location: [facility];  Service: General;  Laterality: Right;  . THYROIDECTOMY  12/04/2011   Procedure: THYROIDECTOMY;  Surgeon: Earnstine Regal, MD;  Location: [facility];  Service: General;  Laterality: N/A;  total thyroidectomy    FAMILY HISTORY: Family History  Problem Relation Age of Onset  . Hypertension Mother   . Hypertension Father     SOCIAL HISTORY: Social History   Socioeconomic History  . Marital status: Widowed    Spouse name: Not on file  . Number of children: 1  . Years of education: 31  . Highest education level: High school graduate  Occupational History  . Occupation: Retired  Scientific laboratory technician  . Financial resource strain: Not on file  . Food insecurity:    Worry: Not on file    Inability: Not on file  . Transportation needs:    Medical: Not on file    Non-medical: Not on file  Tobacco Use  . Smoking status: Never Smoker  . Smokeless tobacco: Never Used  Substance and Sexual Activity  . Alcohol use: No  . Drug use: No  . Sexual activity: Never    Birth  control/protection: Post-menopausal  Lifestyle  . Physical activity:    Days per week: Not on file    Minutes per session: Not on file  . Stress: Not on file  Relationships  . Social connections:    Talks on phone: Not on file    Gets together: Not on file    Attends religious service: Not on file    Active member of club or organization: Not on file    Attends meetings of clubs or organizations: Not on file    Relationship status: Not on file  . Intimate partner violence:    Fear of current or ex partner: Not on file    Emotionally abused: Not on file    Physically abused: Not on file    Forced sexual activity: Not on file  Other Topics Concern  . Not on file  Social History Narrative   Lives at home alone.   Right-handed.   2 cups caffeine per day, occasional soda.     PHYSICAL EXAM   Vitals:   07/02/18 1012  BP: 136/79  Pulse: 67  Weight: 153 lb (69.4 kg)  Height: 5\' 2"  (1.575 m)    Not recorded      Body mass index is 27.98 kg/m.  PHYSICAL EXAMNIATION:  Gen: NAD, conversant, well nourised, obese, well groomed                     Cardiovascular: Regular rate rhythm, no peripheral edema, warm, nontender. Eyes: Conjunctivae clear without exudates or hemorrhage Neck: Supple, no carotid bruits. Pulmonary: Clear to auscultation bilaterally   NEUROLOGICAL EXAM:  MENTAL STATUS: Speech:    Speech is normal; fluent and spontaneous with normal comprehension.  Cognition:     Orientation to time, place and person     Normal recent and remote memory     Normal Attention span and concentration     Normal Language, naming, repeating,spontaneous speech     Fund of knowledge   CRANIAL NERVES: CN II: Visual fields are full to confrontation. Fundoscopic exam is normal with sharp discs and no vascular changes. Pupils are round equal and briskly reactive to light. CN III, IV, VI: extraocular  movement are normal. No ptosis. CN V: Facial sensation is intact to pinprick  in all 3 divisions bilaterally. Corneal responses are intact.  CN VII: Face is symmetric with normal eye closure and smile. CN VIII: Hearing is normal to rubbing fingers CN IX, X: Palate elevates symmetrically. Phonation is normal. CN XI: Head turning and shoulder shrug are intact CN XII: Tongue is midline with normal movements and no atrophy.  MOTOR: There is no pronator drift of out-stretched arms. Muscle bulk and tone are normal. Muscle strength is normal.  REFLEXES: Reflexes are 2+ and symmetric at the biceps, triceps, knees, and ankles. Plantar responses are flexor.  SENSORY: Intact to light touch, pinprick, positional sensation and vibratory sensation are intact in fingers and toes.  COORDINATION: Rapid alternating movements and fine finger movements are intact. There is no dysmetria on finger-to-nose and heel-knee-shin.    GAIT/STANCE: She needs pushed up to get up from seated position, cautious, limp due to left hip pathology   DIAGNOSTIC DATA (LABS, IMAGING, TESTING) - I reviewed patient records, labs, notes, testing and imaging myself where available.   ASSESSMENT AND PLAN  BOBETTA KORF is a 84 y.o. female   Confusion, slurred speech with associated elevated blood pressure on June 10, 2018  Most consistent with hypertensive encephalopathy, in the setting of emotional stress, generalized brain atrophy, mild to moderate supratentorium small vessel disease.  Continue to address vascular risk factor  Aspirin 81 mg daily  Increase water intake,  Check C-reactive protein and ANA to rule out inflammatory process  Only return to clinic for new issues,  Marcial Pacas, M.D. Ph.D.  [facility] [address] [place], [place] [address] Ph: [phone] Fax: [phone]  CC:  Kelton Pillar, MD

## 2018-07-03 LAB — C-REACTIVE PROTEIN: CRP: 2 mg/L (ref 0–10)

## 2018-07-03 LAB — ANA W/REFLEX IF POSITIVE: ANA: NEGATIVE

## 2018-07-07 DIAGNOSIS — Z9011 Acquired absence of right breast and nipple: Secondary | ICD-10-CM | POA: Diagnosis not present

## 2018-07-07 DIAGNOSIS — G9341 Metabolic encephalopathy: Secondary | ICD-10-CM | POA: Diagnosis not present

## 2018-07-07 DIAGNOSIS — N183 Chronic kidney disease, stage 3 (moderate): Secondary | ICD-10-CM | POA: Diagnosis not present

## 2018-07-07 DIAGNOSIS — Z7984 Long term (current) use of oral hypoglycemic drugs: Secondary | ICD-10-CM | POA: Diagnosis not present

## 2018-07-07 DIAGNOSIS — M810 Age-related osteoporosis without current pathological fracture: Secondary | ICD-10-CM | POA: Diagnosis not present

## 2018-07-07 DIAGNOSIS — E1122 Type 2 diabetes mellitus with diabetic chronic kidney disease: Secondary | ICD-10-CM | POA: Diagnosis not present

## 2018-07-07 DIAGNOSIS — E1151 Type 2 diabetes mellitus with diabetic peripheral angiopathy without gangrene: Secondary | ICD-10-CM | POA: Diagnosis not present

## 2018-07-07 DIAGNOSIS — I129 Hypertensive chronic kidney disease with stage 1 through stage 4 chronic kidney disease, or unspecified chronic kidney disease: Secondary | ICD-10-CM | POA: Diagnosis not present

## 2018-07-07 DIAGNOSIS — Z7982 Long term (current) use of aspirin: Secondary | ICD-10-CM | POA: Diagnosis not present

## 2018-07-07 DIAGNOSIS — Z853 Personal history of malignant neoplasm of breast: Secondary | ICD-10-CM | POA: Diagnosis not present

## 2018-07-08 DIAGNOSIS — N183 Chronic kidney disease, stage 3 (moderate): Secondary | ICD-10-CM | POA: Diagnosis not present

## 2018-07-08 DIAGNOSIS — G9341 Metabolic encephalopathy: Secondary | ICD-10-CM | POA: Diagnosis not present

## 2018-07-08 DIAGNOSIS — E7800 Pure hypercholesterolemia, unspecified: Secondary | ICD-10-CM | POA: Diagnosis not present

## 2018-07-08 DIAGNOSIS — M810 Age-related osteoporosis without current pathological fracture: Secondary | ICD-10-CM | POA: Diagnosis not present

## 2018-07-08 DIAGNOSIS — E042 Nontoxic multinodular goiter: Secondary | ICD-10-CM | POA: Diagnosis not present

## 2018-07-08 DIAGNOSIS — E1121 Type 2 diabetes mellitus with diabetic nephropathy: Secondary | ICD-10-CM | POA: Diagnosis not present

## 2018-07-08 DIAGNOSIS — I129 Hypertensive chronic kidney disease with stage 1 through stage 4 chronic kidney disease, or unspecified chronic kidney disease: Secondary | ICD-10-CM | POA: Diagnosis not present

## 2018-07-08 DIAGNOSIS — D510 Vitamin B12 deficiency anemia due to intrinsic factor deficiency: Secondary | ICD-10-CM | POA: Diagnosis not present

## 2018-07-08 DIAGNOSIS — E1151 Type 2 diabetes mellitus with diabetic peripheral angiopathy without gangrene: Secondary | ICD-10-CM | POA: Diagnosis not present

## 2018-07-08 DIAGNOSIS — E039 Hypothyroidism, unspecified: Secondary | ICD-10-CM | POA: Diagnosis not present

## 2018-07-08 DIAGNOSIS — E1122 Type 2 diabetes mellitus with diabetic chronic kidney disease: Secondary | ICD-10-CM | POA: Diagnosis not present

## 2018-07-10 DIAGNOSIS — G9341 Metabolic encephalopathy: Secondary | ICD-10-CM | POA: Diagnosis not present

## 2018-07-10 DIAGNOSIS — E1122 Type 2 diabetes mellitus with diabetic chronic kidney disease: Secondary | ICD-10-CM | POA: Diagnosis not present

## 2018-07-10 DIAGNOSIS — I129 Hypertensive chronic kidney disease with stage 1 through stage 4 chronic kidney disease, or unspecified chronic kidney disease: Secondary | ICD-10-CM | POA: Diagnosis not present

## 2018-07-10 DIAGNOSIS — E1151 Type 2 diabetes mellitus with diabetic peripheral angiopathy without gangrene: Secondary | ICD-10-CM | POA: Diagnosis not present

## 2018-07-10 DIAGNOSIS — N183 Chronic kidney disease, stage 3 (moderate): Secondary | ICD-10-CM | POA: Diagnosis not present

## 2018-07-10 DIAGNOSIS — M810 Age-related osteoporosis without current pathological fracture: Secondary | ICD-10-CM | POA: Diagnosis not present

## 2018-07-11 DIAGNOSIS — G9341 Metabolic encephalopathy: Secondary | ICD-10-CM | POA: Diagnosis not present

## 2018-07-11 DIAGNOSIS — E1122 Type 2 diabetes mellitus with diabetic chronic kidney disease: Secondary | ICD-10-CM | POA: Diagnosis not present

## 2018-07-11 DIAGNOSIS — N183 Chronic kidney disease, stage 3 (moderate): Secondary | ICD-10-CM | POA: Diagnosis not present

## 2018-07-11 DIAGNOSIS — E1151 Type 2 diabetes mellitus with diabetic peripheral angiopathy without gangrene: Secondary | ICD-10-CM | POA: Diagnosis not present

## 2018-07-11 DIAGNOSIS — I129 Hypertensive chronic kidney disease with stage 1 through stage 4 chronic kidney disease, or unspecified chronic kidney disease: Secondary | ICD-10-CM | POA: Diagnosis not present

## 2018-07-11 DIAGNOSIS — M810 Age-related osteoporosis without current pathological fracture: Secondary | ICD-10-CM | POA: Diagnosis not present

## 2018-07-14 DIAGNOSIS — I129 Hypertensive chronic kidney disease with stage 1 through stage 4 chronic kidney disease, or unspecified chronic kidney disease: Secondary | ICD-10-CM | POA: Diagnosis not present

## 2018-07-14 DIAGNOSIS — M810 Age-related osteoporosis without current pathological fracture: Secondary | ICD-10-CM | POA: Diagnosis not present

## 2018-07-14 DIAGNOSIS — N183 Chronic kidney disease, stage 3 (moderate): Secondary | ICD-10-CM | POA: Diagnosis not present

## 2018-07-14 DIAGNOSIS — E1122 Type 2 diabetes mellitus with diabetic chronic kidney disease: Secondary | ICD-10-CM | POA: Diagnosis not present

## 2018-07-14 DIAGNOSIS — E1151 Type 2 diabetes mellitus with diabetic peripheral angiopathy without gangrene: Secondary | ICD-10-CM | POA: Diagnosis not present

## 2018-07-14 DIAGNOSIS — G9341 Metabolic encephalopathy: Secondary | ICD-10-CM | POA: Diagnosis not present

## 2018-07-15 DIAGNOSIS — G9341 Metabolic encephalopathy: Secondary | ICD-10-CM | POA: Diagnosis not present

## 2018-07-15 DIAGNOSIS — N183 Chronic kidney disease, stage 3 (moderate): Secondary | ICD-10-CM | POA: Diagnosis not present

## 2018-07-15 DIAGNOSIS — E1122 Type 2 diabetes mellitus with diabetic chronic kidney disease: Secondary | ICD-10-CM | POA: Diagnosis not present

## 2018-07-15 DIAGNOSIS — M810 Age-related osteoporosis without current pathological fracture: Secondary | ICD-10-CM | POA: Diagnosis not present

## 2018-07-15 DIAGNOSIS — I129 Hypertensive chronic kidney disease with stage 1 through stage 4 chronic kidney disease, or unspecified chronic kidney disease: Secondary | ICD-10-CM | POA: Diagnosis not present

## 2018-07-15 DIAGNOSIS — E1151 Type 2 diabetes mellitus with diabetic peripheral angiopathy without gangrene: Secondary | ICD-10-CM | POA: Diagnosis not present

## 2018-07-17 DIAGNOSIS — M810 Age-related osteoporosis without current pathological fracture: Secondary | ICD-10-CM | POA: Diagnosis not present

## 2018-07-17 DIAGNOSIS — N183 Chronic kidney disease, stage 3 (moderate): Secondary | ICD-10-CM | POA: Diagnosis not present

## 2018-07-17 DIAGNOSIS — E1122 Type 2 diabetes mellitus with diabetic chronic kidney disease: Secondary | ICD-10-CM | POA: Diagnosis not present

## 2018-07-17 DIAGNOSIS — I129 Hypertensive chronic kidney disease with stage 1 through stage 4 chronic kidney disease, or unspecified chronic kidney disease: Secondary | ICD-10-CM | POA: Diagnosis not present

## 2018-07-17 DIAGNOSIS — E1151 Type 2 diabetes mellitus with diabetic peripheral angiopathy without gangrene: Secondary | ICD-10-CM | POA: Diagnosis not present

## 2018-07-17 DIAGNOSIS — G9341 Metabolic encephalopathy: Secondary | ICD-10-CM | POA: Diagnosis not present

## 2018-07-22 DIAGNOSIS — M810 Age-related osteoporosis without current pathological fracture: Secondary | ICD-10-CM | POA: Diagnosis not present

## 2018-07-22 DIAGNOSIS — E1122 Type 2 diabetes mellitus with diabetic chronic kidney disease: Secondary | ICD-10-CM | POA: Diagnosis not present

## 2018-07-22 DIAGNOSIS — N183 Chronic kidney disease, stage 3 (moderate): Secondary | ICD-10-CM | POA: Diagnosis not present

## 2018-07-22 DIAGNOSIS — I129 Hypertensive chronic kidney disease with stage 1 through stage 4 chronic kidney disease, or unspecified chronic kidney disease: Secondary | ICD-10-CM | POA: Diagnosis not present

## 2018-07-22 DIAGNOSIS — G9341 Metabolic encephalopathy: Secondary | ICD-10-CM | POA: Diagnosis not present

## 2018-07-22 DIAGNOSIS — E1151 Type 2 diabetes mellitus with diabetic peripheral angiopathy without gangrene: Secondary | ICD-10-CM | POA: Diagnosis not present

## 2018-07-23 DIAGNOSIS — I129 Hypertensive chronic kidney disease with stage 1 through stage 4 chronic kidney disease, or unspecified chronic kidney disease: Secondary | ICD-10-CM | POA: Diagnosis not present

## 2018-07-23 DIAGNOSIS — E1151 Type 2 diabetes mellitus with diabetic peripheral angiopathy without gangrene: Secondary | ICD-10-CM | POA: Diagnosis not present

## 2018-07-23 DIAGNOSIS — G9341 Metabolic encephalopathy: Secondary | ICD-10-CM | POA: Diagnosis not present

## 2018-07-23 DIAGNOSIS — N183 Chronic kidney disease, stage 3 (moderate): Secondary | ICD-10-CM | POA: Diagnosis not present

## 2018-07-23 DIAGNOSIS — M810 Age-related osteoporosis without current pathological fracture: Secondary | ICD-10-CM | POA: Diagnosis not present

## 2018-07-23 DIAGNOSIS — E1122 Type 2 diabetes mellitus with diabetic chronic kidney disease: Secondary | ICD-10-CM | POA: Diagnosis not present

## 2018-07-28 DIAGNOSIS — G9341 Metabolic encephalopathy: Secondary | ICD-10-CM | POA: Diagnosis not present

## 2018-07-28 DIAGNOSIS — M810 Age-related osteoporosis without current pathological fracture: Secondary | ICD-10-CM | POA: Diagnosis not present

## 2018-07-28 DIAGNOSIS — N183 Chronic kidney disease, stage 3 (moderate): Secondary | ICD-10-CM | POA: Diagnosis not present

## 2018-07-28 DIAGNOSIS — E1151 Type 2 diabetes mellitus with diabetic peripheral angiopathy without gangrene: Secondary | ICD-10-CM | POA: Diagnosis not present

## 2018-07-28 DIAGNOSIS — E1122 Type 2 diabetes mellitus with diabetic chronic kidney disease: Secondary | ICD-10-CM | POA: Diagnosis not present

## 2018-07-28 DIAGNOSIS — I129 Hypertensive chronic kidney disease with stage 1 through stage 4 chronic kidney disease, or unspecified chronic kidney disease: Secondary | ICD-10-CM | POA: Diagnosis not present

## 2018-07-31 DIAGNOSIS — Z853 Personal history of malignant neoplasm of breast: Secondary | ICD-10-CM | POA: Diagnosis not present

## 2018-07-31 DIAGNOSIS — R922 Inconclusive mammogram: Secondary | ICD-10-CM | POA: Diagnosis not present

## 2018-08-05 DIAGNOSIS — G9341 Metabolic encephalopathy: Secondary | ICD-10-CM | POA: Diagnosis not present

## 2018-08-05 DIAGNOSIS — E1151 Type 2 diabetes mellitus with diabetic peripheral angiopathy without gangrene: Secondary | ICD-10-CM | POA: Diagnosis not present

## 2018-08-05 DIAGNOSIS — N183 Chronic kidney disease, stage 3 (moderate): Secondary | ICD-10-CM | POA: Diagnosis not present

## 2018-08-05 DIAGNOSIS — E1122 Type 2 diabetes mellitus with diabetic chronic kidney disease: Secondary | ICD-10-CM | POA: Diagnosis not present

## 2018-08-05 DIAGNOSIS — I129 Hypertensive chronic kidney disease with stage 1 through stage 4 chronic kidney disease, or unspecified chronic kidney disease: Secondary | ICD-10-CM | POA: Diagnosis not present

## 2018-08-05 DIAGNOSIS — M810 Age-related osteoporosis without current pathological fracture: Secondary | ICD-10-CM | POA: Diagnosis not present

## 2018-08-06 DIAGNOSIS — N183 Chronic kidney disease, stage 3 (moderate): Secondary | ICD-10-CM | POA: Diagnosis not present

## 2018-08-06 DIAGNOSIS — M810 Age-related osteoporosis without current pathological fracture: Secondary | ICD-10-CM | POA: Diagnosis not present

## 2018-08-06 DIAGNOSIS — G9341 Metabolic encephalopathy: Secondary | ICD-10-CM | POA: Diagnosis not present

## 2018-08-06 DIAGNOSIS — E1151 Type 2 diabetes mellitus with diabetic peripheral angiopathy without gangrene: Secondary | ICD-10-CM | POA: Diagnosis not present

## 2018-08-06 DIAGNOSIS — I129 Hypertensive chronic kidney disease with stage 1 through stage 4 chronic kidney disease, or unspecified chronic kidney disease: Secondary | ICD-10-CM | POA: Diagnosis not present

## 2018-08-06 DIAGNOSIS — E1122 Type 2 diabetes mellitus with diabetic chronic kidney disease: Secondary | ICD-10-CM | POA: Diagnosis not present

## 2018-08-07 DIAGNOSIS — D510 Vitamin B12 deficiency anemia due to intrinsic factor deficiency: Secondary | ICD-10-CM | POA: Diagnosis not present

## 2018-08-13 DIAGNOSIS — D509 Iron deficiency anemia, unspecified: Secondary | ICD-10-CM | POA: Diagnosis not present

## 2018-08-20 ENCOUNTER — Other Ambulatory Visit (HOSPITAL_COMMUNITY): Payer: Self-pay

## 2018-08-21 ENCOUNTER — Ambulatory Visit (HOSPITAL_COMMUNITY)
Admission: RE | Admit: 2018-08-21 | Discharge: 2018-08-21 | Disposition: A | Discharge status: Home / Self Care | Payer: Medicare Other | Source: Ambulatory Visit | Attending: Gastroenterology | Admitting: Gastroenterology

## 2018-08-21 DIAGNOSIS — D500 Iron deficiency anemia secondary to blood loss (chronic): Secondary | ICD-10-CM | POA: Insufficient documentation

## 2018-08-21 LAB — CBC
HEMATOCRIT: 28.3 % — AB (ref 36.0–46.0)
HEMOGLOBIN: 8.7 g/dL — AB (ref 12.0–15.0)
MCH: 27.4 pg (ref 26.0–34.0)
MCHC: 30.7 g/dL (ref 30.0–36.0)
MCV: 89.3 fL (ref 78.0–100.0)
Platelets: 307 10*3/uL (ref 150–400)
RBC: 3.17 MIL/uL — AB (ref 3.87–5.11)
RDW: 13 % (ref 11.5–15.5)
WBC: 6.3 10*3/uL (ref 4.0–10.5)

## 2018-08-21 LAB — IRON AND TIBC
IRON: 29 ug/dL (ref 28–170)
SATURATION RATIOS: 9 % — AB (ref 10.4–31.8)
TIBC: 336 ug/dL (ref 250–450)
UIBC: 307 ug/dL

## 2018-08-21 LAB — FERRITIN: Ferritin: 6 ng/mL — ABNORMAL LOW (ref 11–307)

## 2018-08-21 MED ORDER — SODIUM CHLORIDE 0.9 % IV SOLN
510.0000 mg | Freq: Once | INTRAVENOUS | Status: DC
  Administered 2018-08-21: 510 mg via INTRAVENOUS
  Filled 2018-08-21: qty 17

## 2018-08-21 NOTE — Discharge Instructions (Signed)

## 2018-09-08 DIAGNOSIS — D510 Vitamin B12 deficiency anemia due to intrinsic factor deficiency: Secondary | ICD-10-CM | POA: Diagnosis not present

## 2018-09-08 DIAGNOSIS — Z23 Encounter for immunization: Secondary | ICD-10-CM | POA: Diagnosis not present

## 2018-09-18 ENCOUNTER — Ambulatory Visit (HOSPITAL_COMMUNITY)
Admission: RE | Admit: 2018-09-18 | Discharge: 2018-09-18 | Disposition: A | Discharge status: Home / Self Care | Payer: Medicare Other | Source: Ambulatory Visit | Attending: Gastroenterology | Admitting: Gastroenterology

## 2018-09-18 DIAGNOSIS — D500 Iron deficiency anemia secondary to blood loss (chronic): Secondary | ICD-10-CM | POA: Diagnosis not present

## 2018-09-18 LAB — IRON AND TIBC
Iron: 51 ug/dL (ref 28–170)
Saturation Ratios: 17 % (ref 10.4–31.8)
TIBC: 297 ug/dL (ref 250–450)
UIBC: 246 ug/dL

## 2018-09-18 LAB — CBC
HEMATOCRIT: 30.4 % — AB (ref 36.0–46.0)
HEMOGLOBIN: 9 g/dL — AB (ref 12.0–15.0)
MCH: 27.1 pg (ref 26.0–34.0)
MCHC: 29.6 g/dL — AB (ref 30.0–36.0)
MCV: 91.6 fL (ref 80.0–100.0)
Platelets: 281 10*3/uL (ref 150–400)
RBC: 3.32 MIL/uL — ABNORMAL LOW (ref 3.87–5.11)
RDW: 15.4 % (ref 11.5–15.5)
WBC: 6.3 10*3/uL (ref 4.0–10.5)
nRBC: 0 % (ref 0.0–0.2)

## 2018-09-18 LAB — FERRITIN: Ferritin: 29 ng/mL (ref 11–307)

## 2018-09-18 MED ORDER — SODIUM CHLORIDE 0.9 % IV SOLN
510.0000 mg | Freq: Once | INTRAVENOUS | Status: AC
  Administered 2018-09-18: 510 mg via INTRAVENOUS
  Filled 2018-09-18: qty 17

## 2018-10-06 DIAGNOSIS — D510 Vitamin B12 deficiency anemia due to intrinsic factor deficiency: Secondary | ICD-10-CM | POA: Diagnosis not present

## 2018-10-20 DIAGNOSIS — D509 Iron deficiency anemia, unspecified: Secondary | ICD-10-CM | POA: Diagnosis not present

## 2018-10-24 ENCOUNTER — Other Ambulatory Visit (HOSPITAL_COMMUNITY): Payer: Self-pay | Admitting: *Deleted

## 2018-10-27 ENCOUNTER — Ambulatory Visit (HOSPITAL_COMMUNITY)
Admission: RE | Admit: 2018-10-27 | Discharge: 2018-10-27 | Disposition: A | Discharge status: Home / Self Care | Payer: Medicare Other | Source: Ambulatory Visit | Attending: Gastroenterology | Admitting: Gastroenterology

## 2018-10-27 DIAGNOSIS — D500 Iron deficiency anemia secondary to blood loss (chronic): Secondary | ICD-10-CM | POA: Diagnosis not present

## 2018-10-27 LAB — IRON AND TIBC
Iron: 42 ug/dL (ref 28–170)
Saturation Ratios: 14 % (ref 10.4–31.8)
TIBC: 301 ug/dL (ref 250–450)
UIBC: 259 ug/dL

## 2018-10-27 LAB — CBC
HCT: 32 % — ABNORMAL LOW (ref 36.0–46.0)
HEMOGLOBIN: 9.7 g/dL — AB (ref 12.0–15.0)
MCH: 27.7 pg (ref 26.0–34.0)
MCHC: 30.3 g/dL (ref 30.0–36.0)
MCV: 91.4 fL (ref 80.0–100.0)
Platelets: 259 10*3/uL (ref 150–400)
RBC: 3.5 MIL/uL — ABNORMAL LOW (ref 3.87–5.11)
RDW: 15.4 % (ref 11.5–15.5)
WBC: 5.8 10*3/uL (ref 4.0–10.5)
nRBC: 0 % (ref 0.0–0.2)

## 2018-10-27 LAB — FERRITIN: FERRITIN: 24 ng/mL (ref 11–307)

## 2018-10-27 MED ORDER — SODIUM CHLORIDE 0.9 % IV SOLN
510.0000 mg | INTRAVENOUS | Status: DC
  Administered 2018-10-27: 510 mg via INTRAVENOUS
  Filled 2018-10-27: qty 17

## 2018-11-03 DIAGNOSIS — D510 Vitamin B12 deficiency anemia due to intrinsic factor deficiency: Secondary | ICD-10-CM | POA: Diagnosis not present

## 2018-11-24 ENCOUNTER — Ambulatory Visit (HOSPITAL_COMMUNITY)
Admission: RE | Admit: 2018-11-24 | Discharge: 2018-11-24 | Disposition: A | Discharge status: Home / Self Care | Payer: Medicare Other | Source: Ambulatory Visit | Attending: Gastroenterology | Admitting: Gastroenterology

## 2018-11-24 DIAGNOSIS — D500 Iron deficiency anemia secondary to blood loss (chronic): Secondary | ICD-10-CM | POA: Insufficient documentation

## 2018-11-24 LAB — IRON AND TIBC
IRON: 46 ug/dL (ref 28–170)
Saturation Ratios: 16 % (ref 10.4–31.8)
TIBC: 291 ug/dL (ref 250–450)
UIBC: 245 ug/dL

## 2018-11-24 LAB — CBC
HCT: 32.2 % — ABNORMAL LOW (ref 36.0–46.0)
Hemoglobin: 9.9 g/dL — ABNORMAL LOW (ref 12.0–15.0)
MCH: 28.7 pg (ref 26.0–34.0)
MCHC: 30.7 g/dL (ref 30.0–36.0)
MCV: 93.3 fL (ref 80.0–100.0)
PLATELETS: 259 10*3/uL (ref 150–400)
RBC: 3.45 MIL/uL — ABNORMAL LOW (ref 3.87–5.11)
RDW: 15.1 % (ref 11.5–15.5)
WBC: 5.7 10*3/uL (ref 4.0–10.5)
nRBC: 0 % (ref 0.0–0.2)

## 2018-11-24 LAB — FERRITIN: FERRITIN: 45 ng/mL (ref 11–307)

## 2018-11-24 MED ORDER — SODIUM CHLORIDE 0.9 % IV SOLN
510.0000 mg | INTRAVENOUS | Status: DC
  Administered 2018-11-24: 510 mg via INTRAVENOUS
  Filled 2018-11-24: qty 17

## 2018-12-01 DIAGNOSIS — J069 Acute upper respiratory infection, unspecified: Secondary | ICD-10-CM | POA: Diagnosis not present

## 2018-12-01 DIAGNOSIS — Z7984 Long term (current) use of oral hypoglycemic drugs: Secondary | ICD-10-CM | POA: Diagnosis not present

## 2018-12-01 DIAGNOSIS — E7800 Pure hypercholesterolemia, unspecified: Secondary | ICD-10-CM | POA: Diagnosis not present

## 2018-12-01 DIAGNOSIS — D509 Iron deficiency anemia, unspecified: Secondary | ICD-10-CM | POA: Diagnosis not present

## 2018-12-01 DIAGNOSIS — D510 Vitamin B12 deficiency anemia due to intrinsic factor deficiency: Secondary | ICD-10-CM | POA: Diagnosis not present

## 2018-12-01 DIAGNOSIS — Z889 Allergy status to unspecified drugs, medicaments and biological substances status: Secondary | ICD-10-CM | POA: Diagnosis not present

## 2018-12-01 DIAGNOSIS — N183 Chronic kidney disease, stage 3 (moderate): Secondary | ICD-10-CM | POA: Diagnosis not present

## 2018-12-01 DIAGNOSIS — E1121 Type 2 diabetes mellitus with diabetic nephropathy: Secondary | ICD-10-CM | POA: Diagnosis not present

## 2018-12-01 DIAGNOSIS — I129 Hypertensive chronic kidney disease with stage 1 through stage 4 chronic kidney disease, or unspecified chronic kidney disease: Secondary | ICD-10-CM | POA: Diagnosis not present

## 2018-12-01 DIAGNOSIS — E039 Hypothyroidism, unspecified: Secondary | ICD-10-CM | POA: Diagnosis not present

## 2018-12-26 ENCOUNTER — Other Ambulatory Visit (HOSPITAL_COMMUNITY): Payer: Self-pay

## 2018-12-29 ENCOUNTER — Ambulatory Visit (HOSPITAL_COMMUNITY)
Admission: RE | Admit: 2018-12-29 | Discharge: 2018-12-29 | Disposition: A | Discharge status: Home / Self Care | Payer: Medicare Other | Source: Ambulatory Visit | Attending: Gastroenterology | Admitting: Gastroenterology

## 2018-12-29 DIAGNOSIS — D500 Iron deficiency anemia secondary to blood loss (chronic): Secondary | ICD-10-CM | POA: Insufficient documentation

## 2018-12-29 LAB — IRON AND TIBC
IRON: 56 ug/dL (ref 28–170)
SATURATION RATIOS: 20 % (ref 10.4–31.8)
TIBC: 274 ug/dL (ref 250–450)
UIBC: 218 ug/dL

## 2018-12-29 LAB — CBC
HEMATOCRIT: 33.7 % — AB (ref 36.0–46.0)
HEMOGLOBIN: 10.7 g/dL — AB (ref 12.0–15.0)
MCH: 29.4 pg (ref 26.0–34.0)
MCHC: 31.8 g/dL (ref 30.0–36.0)
MCV: 92.6 fL (ref 80.0–100.0)
NRBC: 0 % (ref 0.0–0.2)
Platelets: 302 10*3/uL (ref 150–400)
RBC: 3.64 MIL/uL — AB (ref 3.87–5.11)
RDW: 14 % (ref 11.5–15.5)
WBC: 6.6 10*3/uL (ref 4.0–10.5)

## 2018-12-29 LAB — FERRITIN: Ferritin: 65 ng/mL (ref 11–307)

## 2018-12-29 MED ORDER — SODIUM CHLORIDE 0.9 % IV SOLN
510.0000 mg | Freq: Once | INTRAVENOUS | Status: AC
  Administered 2018-12-29: 510 mg via INTRAVENOUS
  Filled 2018-12-29: qty 510

## 2018-12-30 DIAGNOSIS — D510 Vitamin B12 deficiency anemia due to intrinsic factor deficiency: Secondary | ICD-10-CM | POA: Diagnosis not present

## 2019-01-26 ENCOUNTER — Other Ambulatory Visit: Payer: Self-pay

## 2019-01-26 ENCOUNTER — Ambulatory Visit (HOSPITAL_COMMUNITY)
Admission: RE | Admit: 2019-01-26 | Discharge: 2019-01-26 | Disposition: A | Discharge status: Home / Self Care | Payer: Medicare Other | Source: Ambulatory Visit | Attending: Gastroenterology | Admitting: Gastroenterology

## 2019-01-26 DIAGNOSIS — D500 Iron deficiency anemia secondary to blood loss (chronic): Secondary | ICD-10-CM | POA: Insufficient documentation

## 2019-01-26 LAB — FERRITIN: Ferritin: 118 ng/mL (ref 11–307)

## 2019-01-26 LAB — CBC
HCT: 35.6 % — ABNORMAL LOW (ref 36.0–46.0)
HEMOGLOBIN: 10.9 g/dL — AB (ref 12.0–15.0)
MCH: 28.8 pg (ref 26.0–34.0)
MCHC: 30.6 g/dL (ref 30.0–36.0)
MCV: 94.2 fL (ref 80.0–100.0)
Platelets: 231 10*3/uL (ref 150–400)
RBC: 3.78 MIL/uL — AB (ref 3.87–5.11)
RDW: 13.5 % (ref 11.5–15.5)
WBC: 5.8 10*3/uL (ref 4.0–10.5)
nRBC: 0 % (ref 0.0–0.2)

## 2019-01-26 LAB — IRON AND TIBC
Iron: 71 ug/dL (ref 28–170)
SATURATION RATIOS: 26 % (ref 10.4–31.8)
TIBC: 273 ug/dL (ref 250–450)
UIBC: 202 ug/dL

## 2019-01-26 MED ORDER — SODIUM CHLORIDE 0.9 % IV SOLN
510.0000 mg | Freq: Once | INTRAVENOUS | Status: AC
  Administered 2019-01-26: 510 mg via INTRAVENOUS
  Filled 2019-01-26: qty 510

## 2019-02-02 DIAGNOSIS — D510 Vitamin B12 deficiency anemia due to intrinsic factor deficiency: Secondary | ICD-10-CM | POA: Diagnosis not present

## 2019-02-23 ENCOUNTER — Other Ambulatory Visit: Payer: Self-pay

## 2019-02-23 ENCOUNTER — Ambulatory Visit (HOSPITAL_COMMUNITY)
Admission: RE | Admit: 2019-02-23 | Discharge: 2019-02-23 | Disposition: A | Discharge status: Home / Self Care | Payer: Medicare Other | Source: Ambulatory Visit | Attending: Gastroenterology | Admitting: Gastroenterology

## 2019-02-23 DIAGNOSIS — D500 Iron deficiency anemia secondary to blood loss (chronic): Secondary | ICD-10-CM | POA: Insufficient documentation

## 2019-02-23 LAB — CBC
HCT: 36 % (ref 36.0–46.0)
Hemoglobin: 11.1 g/dL — ABNORMAL LOW (ref 12.0–15.0)
MCH: 29 pg (ref 26.0–34.0)
MCHC: 30.8 g/dL (ref 30.0–36.0)
MCV: 94 fL (ref 80.0–100.0)
Platelets: 239 10*3/uL (ref 150–400)
RBC: 3.83 MIL/uL — ABNORMAL LOW (ref 3.87–5.11)
RDW: 13.4 % (ref 11.5–15.5)
WBC: 7.6 10*3/uL (ref 4.0–10.5)
nRBC: 0 % (ref 0.0–0.2)

## 2019-02-23 LAB — IRON AND TIBC
Iron: 81 ug/dL (ref 28–170)
Saturation Ratios: 32 % — ABNORMAL HIGH (ref 10.4–31.8)
TIBC: 255 ug/dL (ref 250–450)
UIBC: 174 ug/dL

## 2019-02-23 LAB — FERRITIN: Ferritin: 206 ng/mL (ref 11–307)

## 2019-02-23 MED ORDER — SODIUM CHLORIDE 0.9 % IV SOLN
510.0000 mg | Freq: Once | INTRAVENOUS | Status: DC
  Administered 2019-02-23: 510 mg via INTRAVENOUS
  Filled 2019-02-23: qty 510

## 2019-03-23 ENCOUNTER — Other Ambulatory Visit: Payer: Self-pay

## 2019-03-23 ENCOUNTER — Ambulatory Visit (HOSPITAL_COMMUNITY)
Admission: RE | Admit: 2019-03-23 | Discharge: 2019-03-23 | Disposition: A | Discharge status: Home / Self Care | Payer: Medicare Other | Source: Ambulatory Visit | Attending: Gastroenterology | Admitting: Gastroenterology

## 2019-03-23 DIAGNOSIS — D500 Iron deficiency anemia secondary to blood loss (chronic): Secondary | ICD-10-CM | POA: Insufficient documentation

## 2019-03-23 LAB — IRON AND TIBC
Iron: 68 ug/dL (ref 28–170)
Saturation Ratios: 27 % (ref 10.4–31.8)
TIBC: 252 ug/dL (ref 250–450)
UIBC: 184 ug/dL

## 2019-03-23 LAB — CBC
HCT: 32.9 % — ABNORMAL LOW (ref 36.0–46.0)
Hemoglobin: 10.4 g/dL — ABNORMAL LOW (ref 12.0–15.0)
MCH: 29.8 pg (ref 26.0–34.0)
MCHC: 31.6 g/dL (ref 30.0–36.0)
MCV: 94.3 fL (ref 80.0–100.0)
Platelets: 232 10*3/uL (ref 150–400)
RBC: 3.49 MIL/uL — ABNORMAL LOW (ref 3.87–5.11)
RDW: 13.2 % (ref 11.5–15.5)
WBC: 7 10*3/uL (ref 4.0–10.5)
nRBC: 0 % (ref 0.0–0.2)

## 2019-03-23 LAB — FERRITIN: Ferritin: 276 ng/mL (ref 11–307)

## 2019-03-23 MED ORDER — SODIUM CHLORIDE 0.9 % IV SOLN
510.0000 mg | Freq: Once | INTRAVENOUS | Status: DC
  Administered 2019-03-23: 510 mg via INTRAVENOUS
  Filled 2019-03-23: qty 510

## 2019-03-30 DIAGNOSIS — D510 Vitamin B12 deficiency anemia due to intrinsic factor deficiency: Secondary | ICD-10-CM | POA: Diagnosis not present

## 2019-03-30 IMAGING — DX DG CHEST 2V
2 series · 2 of 2 positions shown · non-contrast
Comparison: 05/11/2014

CLINICAL DATA: Altered mental status.

EXAM:
CHEST - 2 VIEW

[x chest ap]
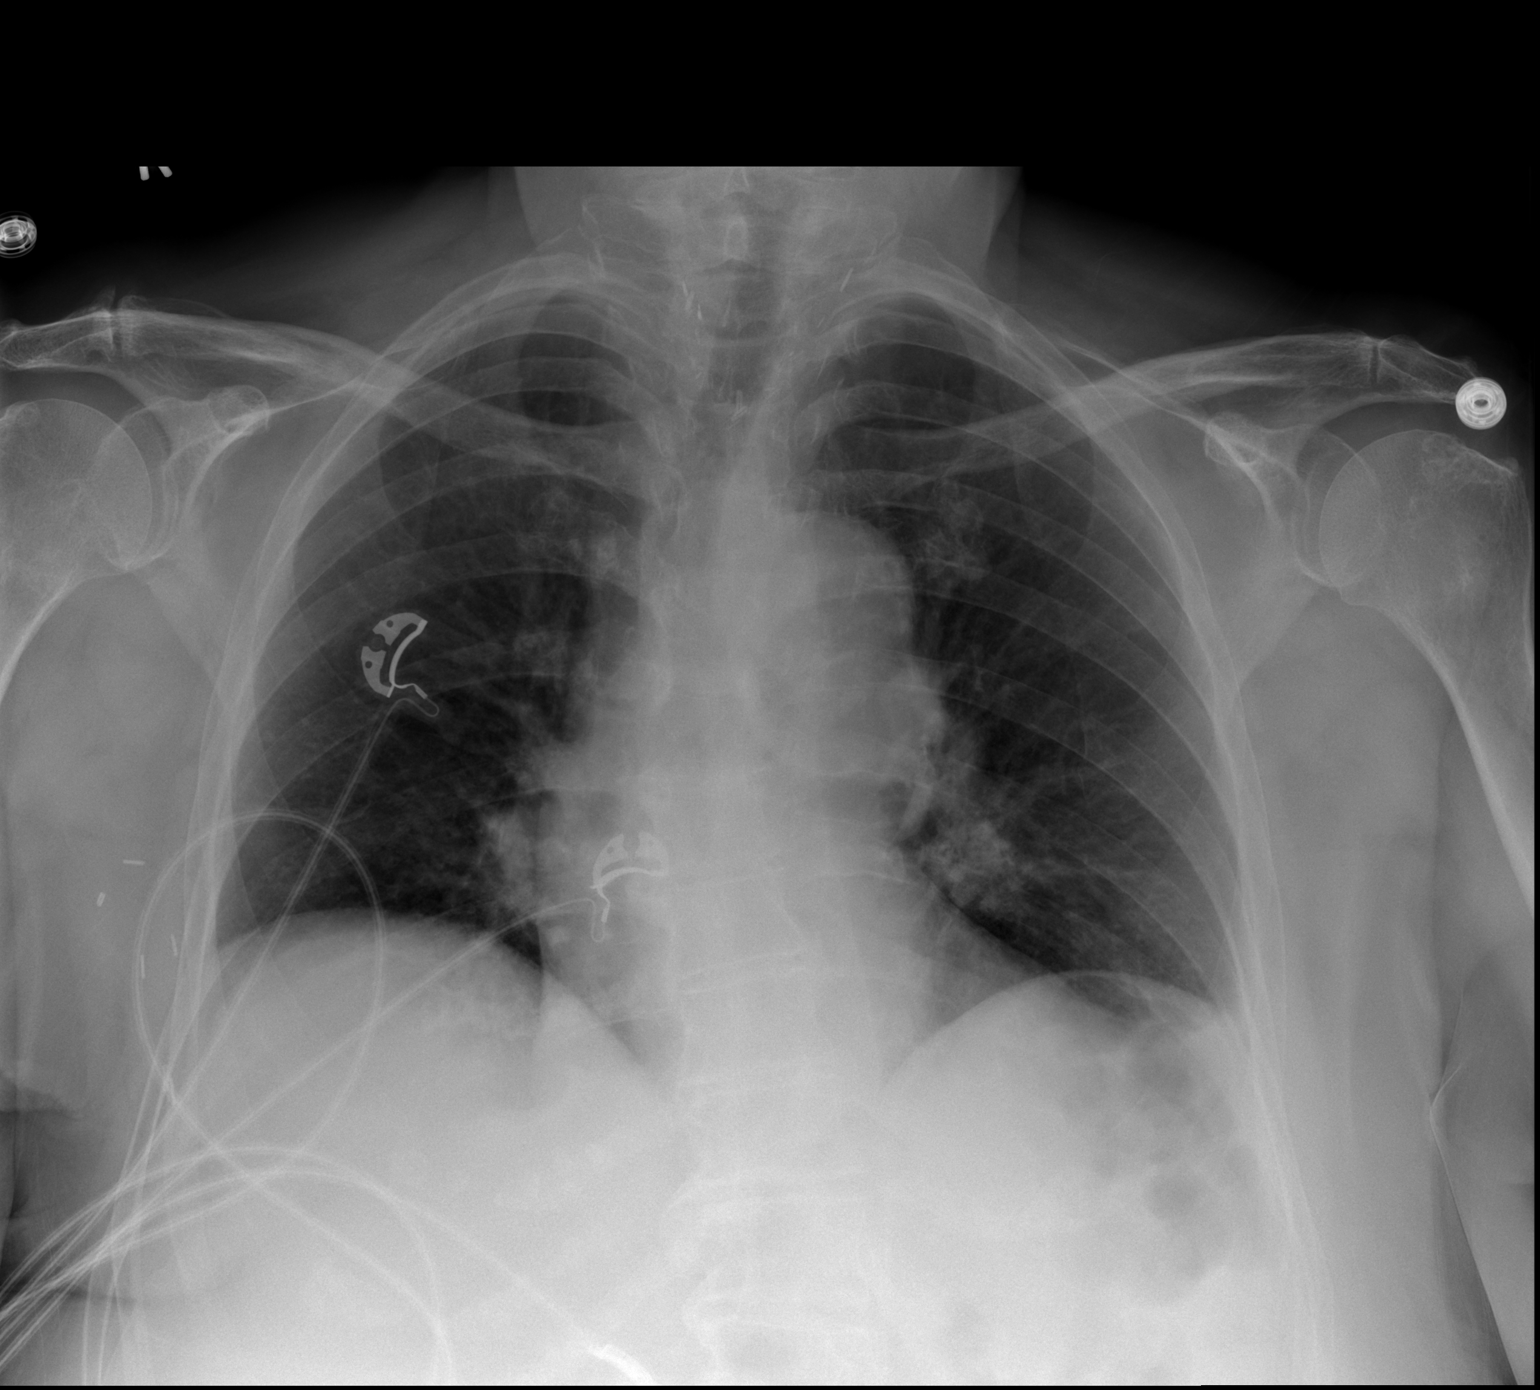

[w chest lat]
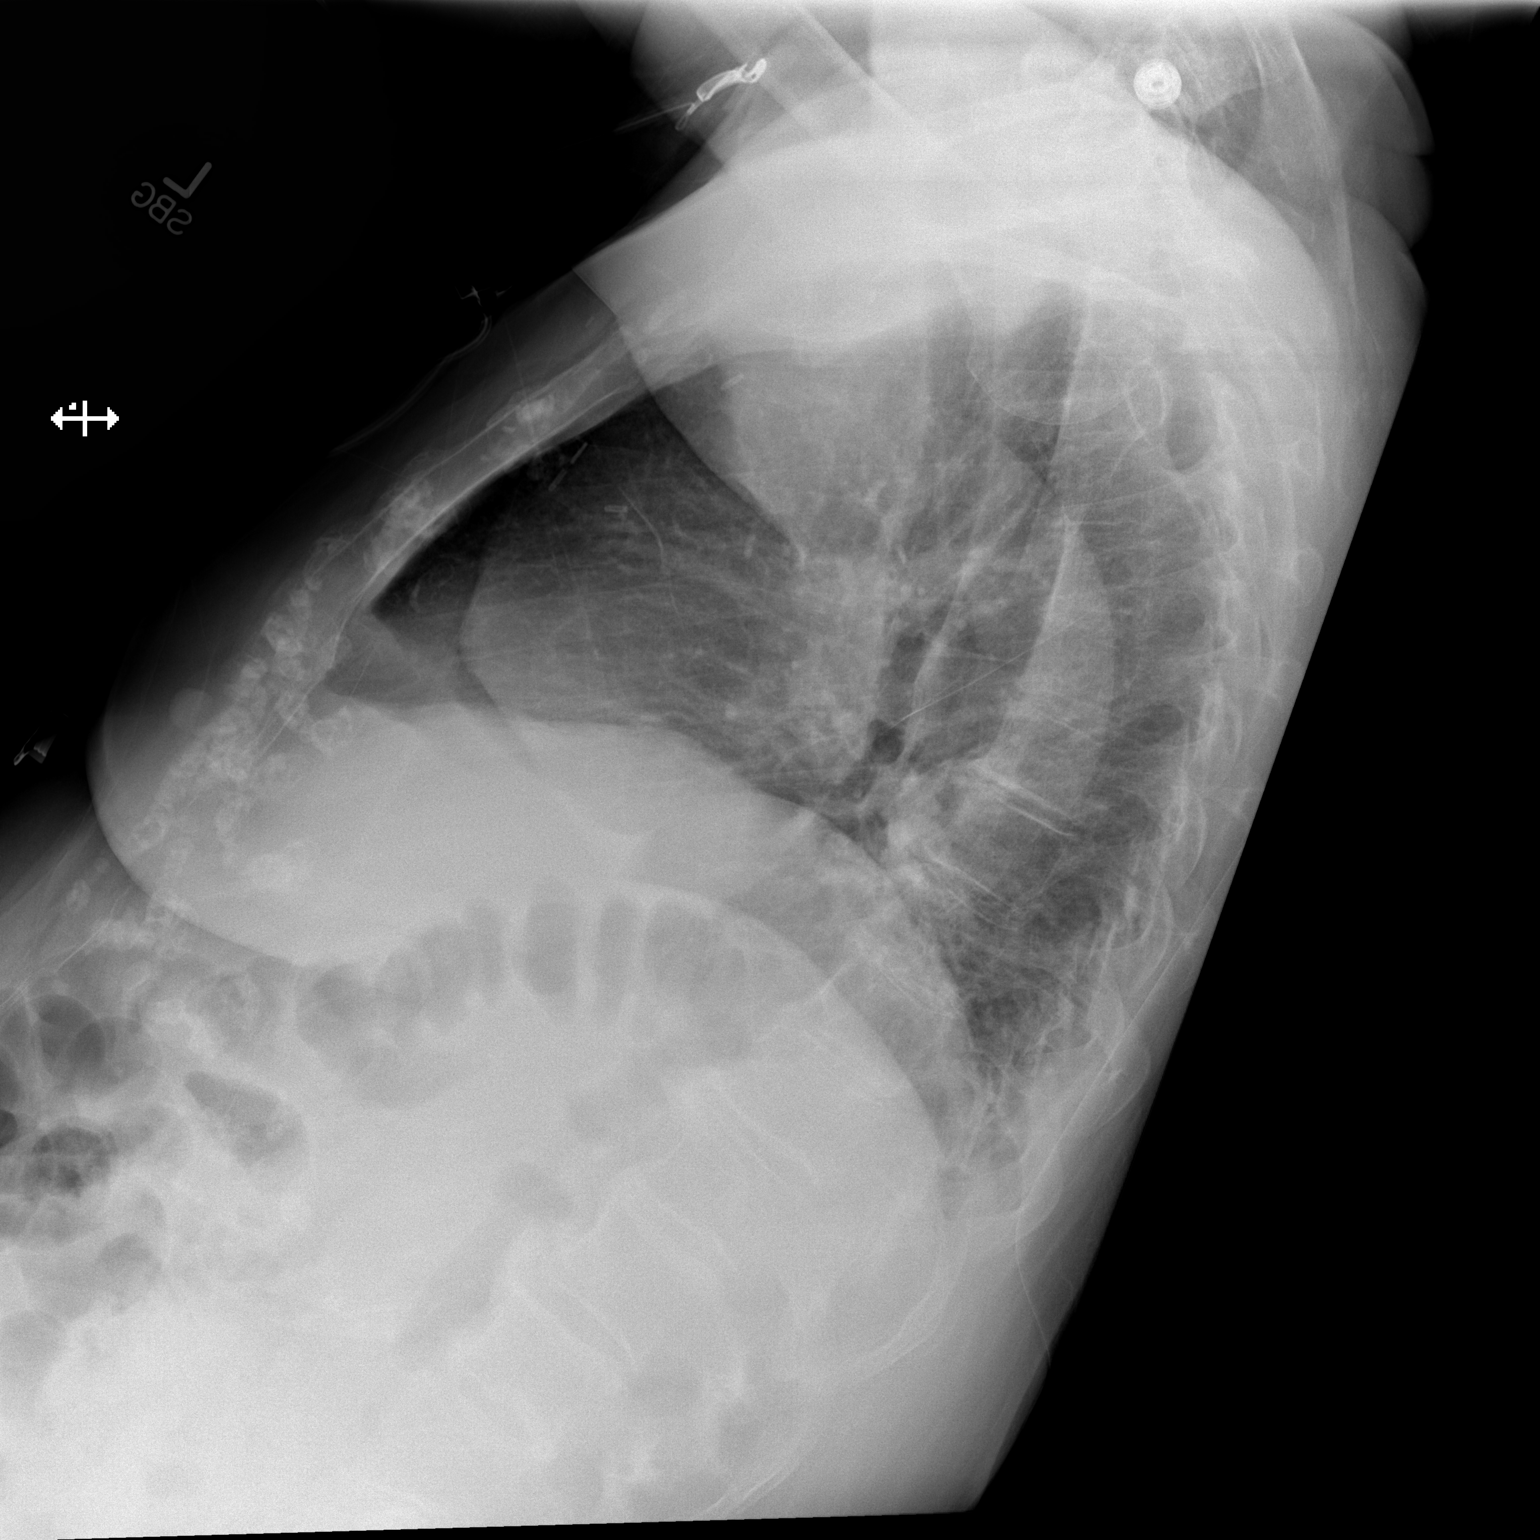

[2 of 2 positions shown; findings below may reference images not displayed]

FINDINGS: Slightly decreased lung volumes. Vascular crowding in the hilar
regions. No focal airspace disease or pulmonary edema. Heart size is
within normal limits. No large pleural effusions.
IMPRESSION: Low lung volumes without focal chest disease.

## 2019-04-02 IMAGING — XA IR VERTEBRAL  SELECTIVE  UNILAT LEFT (MS)
1 series · 12 of 24 positions shown · IV contrast (IODINE)
Comparison: MRI MRA of the brain of 06/10/2018.

CLINICAL DATA: Sudden confusional state. Right carotid severe
stenosis on MRA examination.

EXAM:
IR ANGIO EXTRACRAN SELECT COM CAROTID INNOMINATE BILAT MOD SED; IR
ANGIO VERTEBRAL SEL VERTEBRAL UNI LEFT MOD SED; IR ANGIO VERTEBRAL
SEL SUBCLAVIAN INNOMINATE UNI RIGHT MOD SED
TECHNIQUE: Informed written consent was obtained from the patient after a
thorough discussion of the procedural risks, benefits and
alternatives. All questions were addressed. Maximal Sterile Barrier
Technique was utilized including caps, mask, sterile gowns, sterile
gloves, sterile drape, hand hygiene and skin antiseptic. A timeout
was performed prior to the initiation of the procedure.

[Series 300: ir angio extracran sel com carotid innom · 12 of 139 slices shown]
[im 7/139]
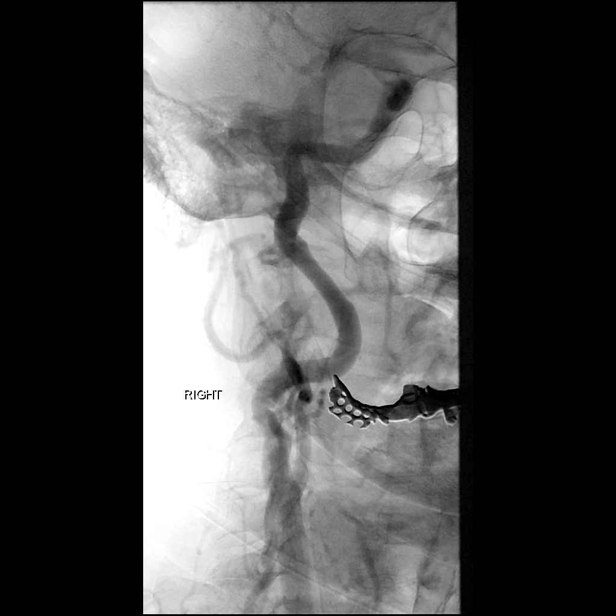
[im 19/139]
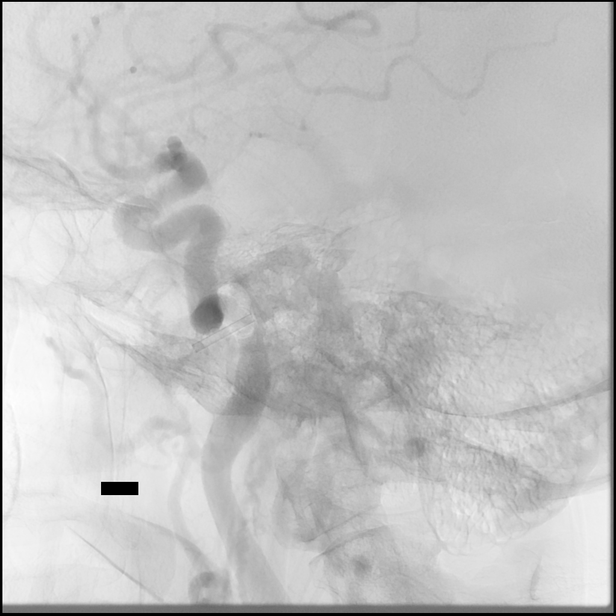
[im 31/139]
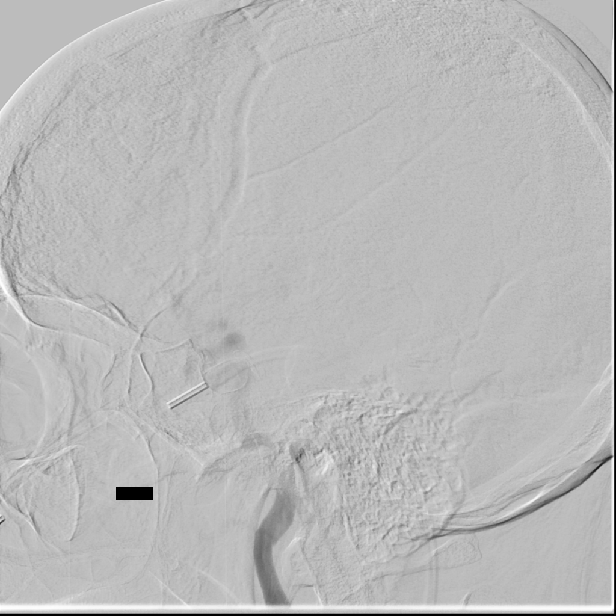
[im 43/139]
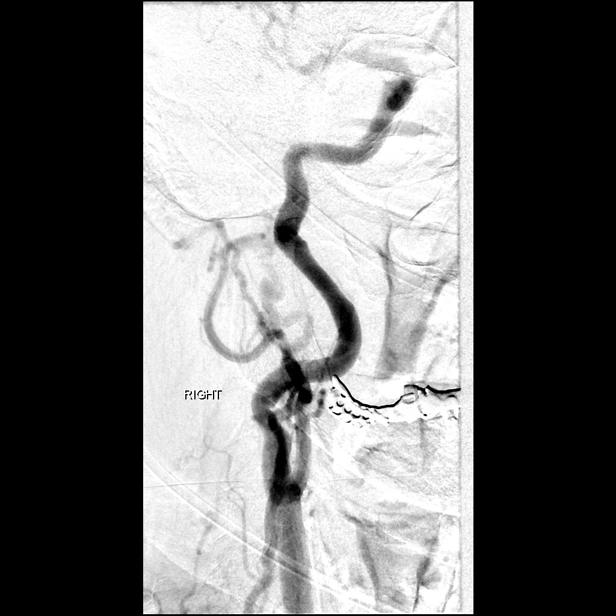
[im 55/139]
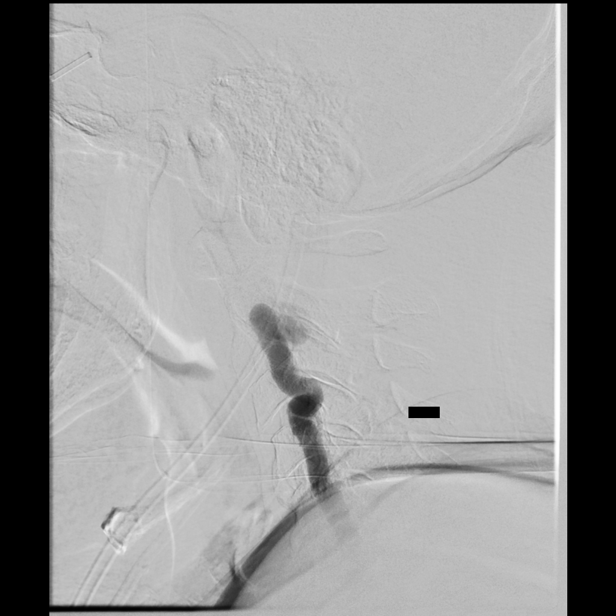
[im 67/139]
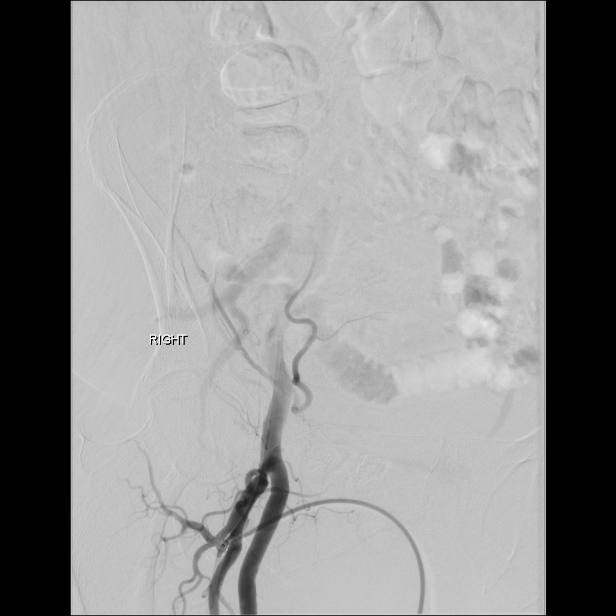
[im 79/139]
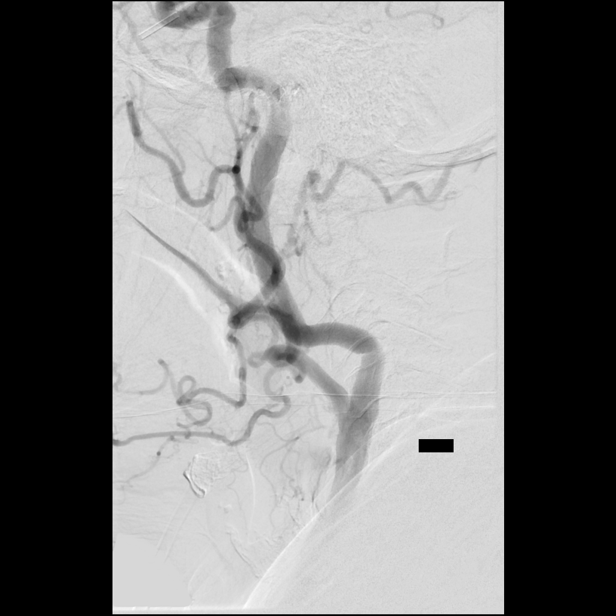
[im 91/139]
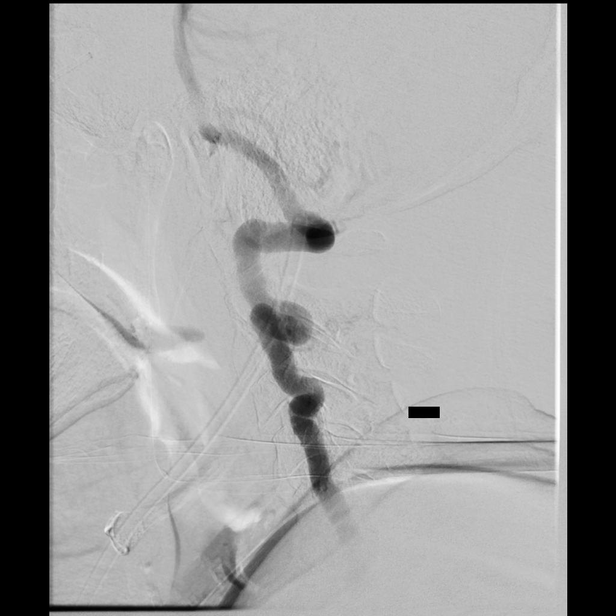
[im 103/139]
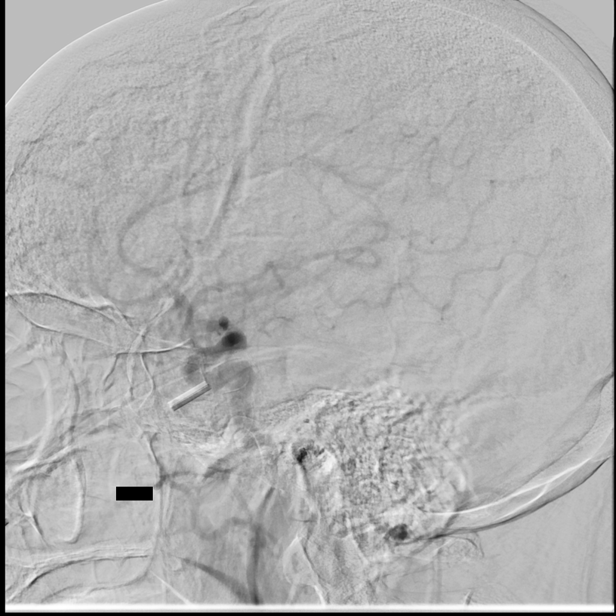
[im 115/139]
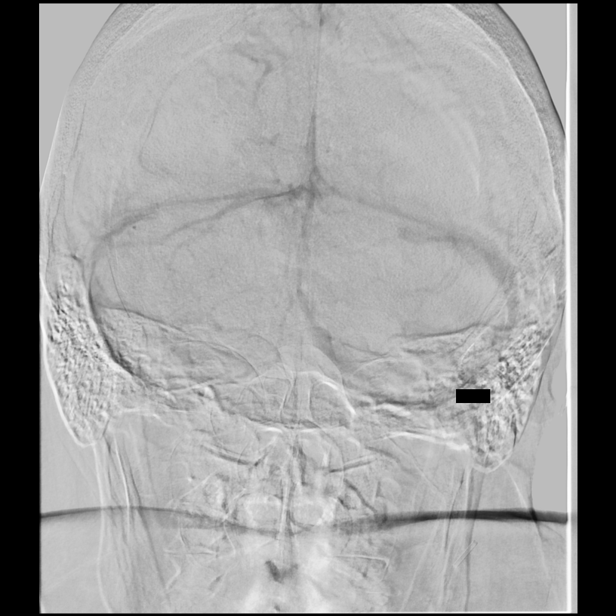
[im 127/139]
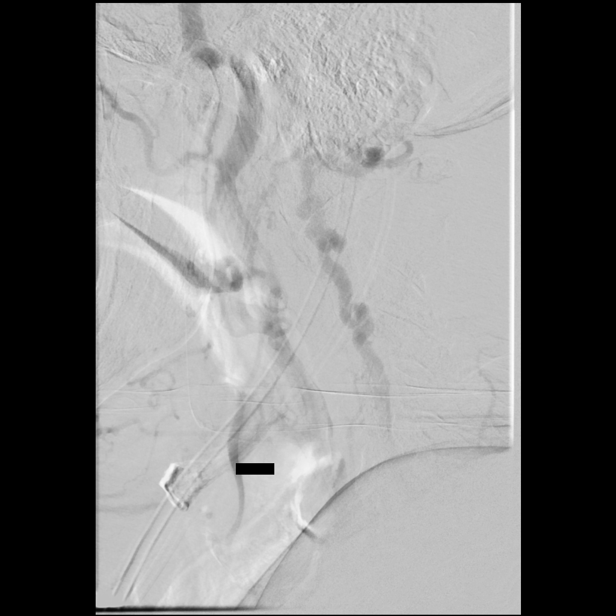
[im 139/139]
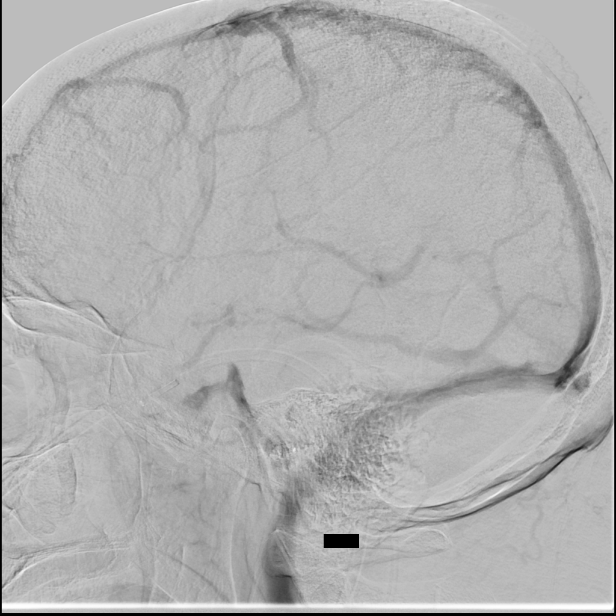

[12 of 24 positions shown; findings below may reference images not displayed]

MEDICATIONS:
Heparin 0555 units IV; no antibiotic was administered within 1 hour
of the procedure.

ANESTHESIA/SEDATION:
Versed 0 mg IV; Fentanyl 25 mcg IV

Moderate Sedation Time:  21 minutes

The patient was continuously monitored during the procedure by the
interventional radiology nurse under my direct supervision.

CONTRAST:  Isovue 300 approximately 50 cc.

FLUOROSCOPY TIME:  Fluoroscopy Time: 5 minutes 48 seconds (520 mGy).

COMPLICATIONS:
None immediate.
The right groin was prepped and draped in the usual sterile fashion.
Thereafter using modified Seldinger technique, transfemoral access
into the right common femoral artery was obtained without
difficulty. Over a 0.035 inch guidewire, a 5 French Pinnacle sheath
was inserted. Through this, and also over 0.035 inch guidewire, a 5
French JB 1 catheter was advanced to the aortic arch region and
selectively positioned in the innominate artery, the left common
carotid artery and the left vertebral artery.
FINDINGS: The innominate artery arteriogram demonstrates the right subclavian
artery and the right common carotid artery to be widely patent.

A hypoplastic right vertebral artery is seen to meander in a
tortuous fashion to the cranial skull base. The tortuosity appears
to be worse in the upper cervical levels at C2-C3, C3-C4. Flow is
noted in the right vertebrobasilar junction.

The right common carotid arteriogram demonstrates the right external
carotid artery and its major branches to be widely patent.

The right internal carotid artery at the bulb to the cranial skull
base demonstrates wide patency without evidence of severe stenosis
in the upper cervical [DATE] of the right internal carotid artery.

The petrous, the cavernous and the supraclinoid segments are widely
patent.

There is mild circumferential narrowing of the right internal
carotid artery in the supraclinoid segment distal to the origin of
the ophthalmic artery.

The right middle cerebral artery and the right anterior cerebral
artery opacify into the capillary and venous phases.

There is mild focal stenosis of the mid to distal [DATE] of the M1
segment of the right middle cerebral artery.

The left common carotid arteriogram demonstrates the left external
carotid artery and its major branches to be widely patent.

A left internal carotid artery at the bulb to the cranial skull base
opacifies widely.

The petrous, cavernous, and the supraclinoid left ICA are widely
patent.

A left posterior communicating artery is seen opacifying the left
posterior cerebral artery distribution.

The left middle cerebral artery and the left anterior cerebral
artery opacify into the capillary and venous phases.

Again demonstrated are mild focal areas of narrowing involving the
middle [DATE], the distal [DATE] of the left middle cerebral artery, and
also of the left anterior cerebral artery distal M1 segment. Focal
areas of mild caliber irregularity are also seen involving the
pericallosal branches of the left anterior cerebral artery.

The left vertebral artery origin appears to be mildly narrowed. Just
distal to this there moderate tortuosity without evidence of
kinking.

More distally, the left vertebral artery demonstrates a
moderately-severe tortuous course with mild fusiform dilatation in
its entirety to the cranial skull base.

The left vertebrobasilar junction and the left posterior-inferior
cerebellar artery demonstrate wide patency with mild focal areas of

caliber irregularity involving the proximal [DATE] of the left
posterior-inferior cerebellar artery.

The basilar artery, the opacified portion of the posterior cerebral
arteries, the superior cerebellar arteries and the anterior-inferior
cerebellar arteries demonstrate wide patency, with progression into
the capillary and venous phases.
IMPRESSION: No angio evidence of high-grade stenosis of the right internal
carotid artery in its distal [DATE].

Focal areas of caliber irregularity involving the middle cerebral
arteries, the left anterior cerebral artery pericallosal regions,
and the left posterior-inferior cerebellar artery in its proximal
[DATE]. These findings are nonspecific.

Differential considerations are arteriosclerotic intracranial
disease, versus spasm versus vasculitis.

PLAN:
As per referring physician.

## 2019-04-20 ENCOUNTER — Other Ambulatory Visit: Payer: Self-pay

## 2019-04-20 ENCOUNTER — Encounter (HOSPITAL_COMMUNITY)
Admission: RE | Admit: 2019-04-20 | Discharge: 2019-04-20 | Disposition: A | Discharge status: Home / Self Care | Payer: Medicare Other | Source: Ambulatory Visit | Attending: Gastroenterology | Admitting: Gastroenterology

## 2019-04-20 DIAGNOSIS — D500 Iron deficiency anemia secondary to blood loss (chronic): Secondary | ICD-10-CM | POA: Diagnosis not present

## 2019-04-20 LAB — FERRITIN: Ferritin: 304 ng/mL (ref 11–307)

## 2019-04-20 LAB — IRON AND TIBC
Iron: 86 ug/dL (ref 28–170)
Saturation Ratios: 34 % — ABNORMAL HIGH (ref 10.4–31.8)
TIBC: 255 ug/dL (ref 250–450)
UIBC: 169 ug/dL

## 2019-04-20 LAB — CBC
HCT: 34.6 % — ABNORMAL LOW (ref 36.0–46.0)
Hemoglobin: 10.9 g/dL — ABNORMAL LOW (ref 12.0–15.0)
MCH: 30.2 pg (ref 26.0–34.0)
MCHC: 31.5 g/dL (ref 30.0–36.0)
MCV: 95.8 fL (ref 80.0–100.0)
Platelets: 231 10*3/uL (ref 150–400)
RBC: 3.61 MIL/uL — ABNORMAL LOW (ref 3.87–5.11)
RDW: 12.8 % (ref 11.5–15.5)
WBC: 5.9 10*3/uL (ref 4.0–10.5)
nRBC: 0 % (ref 0.0–0.2)

## 2019-04-20 MED ORDER — SODIUM CHLORIDE 0.9 % IV SOLN
510.0000 mg | Freq: Once | INTRAVENOUS | Status: AC
  Administered 2019-04-20: 510 mg via INTRAVENOUS
  Filled 2019-04-20: qty 510

## 2019-04-27 DIAGNOSIS — D510 Vitamin B12 deficiency anemia due to intrinsic factor deficiency: Secondary | ICD-10-CM | POA: Diagnosis not present

## 2019-05-04 DIAGNOSIS — Z961 Presence of intraocular lens: Secondary | ICD-10-CM | POA: Diagnosis not present

## 2019-05-04 DIAGNOSIS — H524 Presbyopia: Secondary | ICD-10-CM | POA: Diagnosis not present

## 2019-05-04 DIAGNOSIS — Z7984 Long term (current) use of oral hypoglycemic drugs: Secondary | ICD-10-CM | POA: Diagnosis not present

## 2019-05-04 DIAGNOSIS — E119 Type 2 diabetes mellitus without complications: Secondary | ICD-10-CM | POA: Diagnosis not present

## 2019-05-04 DIAGNOSIS — H52203 Unspecified astigmatism, bilateral: Secondary | ICD-10-CM | POA: Diagnosis not present

## 2019-05-14 DIAGNOSIS — C50411 Malignant neoplasm of upper-outer quadrant of right female breast: Secondary | ICD-10-CM | POA: Diagnosis not present

## 2019-05-14 DIAGNOSIS — Z0000 Encounter for general adult medical examination without abnormal findings: Secondary | ICD-10-CM | POA: Diagnosis not present

## 2019-05-14 DIAGNOSIS — I129 Hypertensive chronic kidney disease with stage 1 through stage 4 chronic kidney disease, or unspecified chronic kidney disease: Secondary | ICD-10-CM | POA: Diagnosis not present

## 2019-05-14 DIAGNOSIS — Z889 Allergy status to unspecified drugs, medicaments and biological substances status: Secondary | ICD-10-CM | POA: Diagnosis not present

## 2019-05-14 DIAGNOSIS — E039 Hypothyroidism, unspecified: Secondary | ICD-10-CM | POA: Diagnosis not present

## 2019-05-14 DIAGNOSIS — D510 Vitamin B12 deficiency anemia due to intrinsic factor deficiency: Secondary | ICD-10-CM | POA: Diagnosis not present

## 2019-05-14 DIAGNOSIS — Z1389 Encounter for screening for other disorder: Secondary | ICD-10-CM | POA: Diagnosis not present

## 2019-05-14 DIAGNOSIS — E1121 Type 2 diabetes mellitus with diabetic nephropathy: Secondary | ICD-10-CM | POA: Diagnosis not present

## 2019-05-14 DIAGNOSIS — N183 Chronic kidney disease, stage 3 (moderate): Secondary | ICD-10-CM | POA: Diagnosis not present

## 2019-05-14 DIAGNOSIS — I5030 Unspecified diastolic (congestive) heart failure: Secondary | ICD-10-CM | POA: Diagnosis not present

## 2019-05-14 DIAGNOSIS — D500 Iron deficiency anemia secondary to blood loss (chronic): Secondary | ICD-10-CM | POA: Diagnosis not present

## 2019-05-14 DIAGNOSIS — E7800 Pure hypercholesterolemia, unspecified: Secondary | ICD-10-CM | POA: Diagnosis not present

## 2019-05-18 ENCOUNTER — Encounter (HOSPITAL_COMMUNITY): Payer: Medicare Other

## 2019-05-25 ENCOUNTER — Encounter (HOSPITAL_COMMUNITY): Payer: Medicare Other

## 2019-05-27 DIAGNOSIS — D509 Iron deficiency anemia, unspecified: Secondary | ICD-10-CM | POA: Diagnosis not present

## 2019-06-01 DIAGNOSIS — D510 Vitamin B12 deficiency anemia due to intrinsic factor deficiency: Secondary | ICD-10-CM | POA: Diagnosis not present

## 2019-06-01 DIAGNOSIS — E1121 Type 2 diabetes mellitus with diabetic nephropathy: Secondary | ICD-10-CM | POA: Diagnosis not present

## 2019-06-01 DIAGNOSIS — I129 Hypertensive chronic kidney disease with stage 1 through stage 4 chronic kidney disease, or unspecified chronic kidney disease: Secondary | ICD-10-CM | POA: Diagnosis not present

## 2019-06-01 DIAGNOSIS — E038 Other specified hypothyroidism: Secondary | ICD-10-CM | POA: Diagnosis not present

## 2019-07-06 DIAGNOSIS — D509 Iron deficiency anemia, unspecified: Secondary | ICD-10-CM | POA: Diagnosis not present

## 2019-07-06 DIAGNOSIS — D510 Vitamin B12 deficiency anemia due to intrinsic factor deficiency: Secondary | ICD-10-CM | POA: Diagnosis not present

## 2019-08-10 DIAGNOSIS — E039 Hypothyroidism, unspecified: Secondary | ICD-10-CM | POA: Diagnosis not present

## 2019-08-10 DIAGNOSIS — E538 Deficiency of other specified B group vitamins: Secondary | ICD-10-CM | POA: Diagnosis not present

## 2019-08-12 DIAGNOSIS — Z1231 Encounter for screening mammogram for malignant neoplasm of breast: Secondary | ICD-10-CM | POA: Diagnosis not present

## 2019-08-12 DIAGNOSIS — Z853 Personal history of malignant neoplasm of breast: Secondary | ICD-10-CM | POA: Diagnosis not present

## 2019-09-07 DIAGNOSIS — D509 Iron deficiency anemia, unspecified: Secondary | ICD-10-CM | POA: Diagnosis not present

## 2019-09-07 DIAGNOSIS — D510 Vitamin B12 deficiency anemia due to intrinsic factor deficiency: Secondary | ICD-10-CM | POA: Diagnosis not present

## 2019-09-07 DIAGNOSIS — Z23 Encounter for immunization: Secondary | ICD-10-CM | POA: Diagnosis not present

## 2019-10-05 DIAGNOSIS — D510 Vitamin B12 deficiency anemia due to intrinsic factor deficiency: Secondary | ICD-10-CM | POA: Diagnosis not present

## 2019-10-27 DIAGNOSIS — D509 Iron deficiency anemia, unspecified: Secondary | ICD-10-CM | POA: Diagnosis not present

## 2019-11-19 ENCOUNTER — Emergency Department (HOSPITAL_COMMUNITY): Payer: Medicare Other

## 2019-11-19 ENCOUNTER — Observation Stay (HOSPITAL_COMMUNITY)
Admission: EM | Admit: 2019-11-19 | Discharge: 2019-11-20 | Disposition: A | Discharge status: Home / Self Care | Payer: Medicare Other | Attending: Internal Medicine | Admitting: Internal Medicine

## 2019-11-19 ENCOUNTER — Other Ambulatory Visit: Payer: Self-pay

## 2019-11-19 ENCOUNTER — Encounter (HOSPITAL_COMMUNITY): Payer: Self-pay | Admitting: Emergency Medicine

## 2019-11-19 DIAGNOSIS — I129 Hypertensive chronic kidney disease with stage 1 through stage 4 chronic kidney disease, or unspecified chronic kidney disease: Secondary | ICD-10-CM | POA: Insufficient documentation

## 2019-11-19 DIAGNOSIS — E1151 Type 2 diabetes mellitus with diabetic peripheral angiopathy without gangrene: Secondary | ICD-10-CM | POA: Diagnosis not present

## 2019-11-19 DIAGNOSIS — Z7982 Long term (current) use of aspirin: Secondary | ICD-10-CM | POA: Insufficient documentation

## 2019-11-19 DIAGNOSIS — S32592A Other specified fracture of left pubis, initial encounter for closed fracture: Secondary | ICD-10-CM | POA: Insufficient documentation

## 2019-11-19 DIAGNOSIS — R2681 Unsteadiness on feet: Secondary | ICD-10-CM | POA: Diagnosis not present

## 2019-11-19 DIAGNOSIS — Z20828 Contact with and (suspected) exposure to other viral communicable diseases: Secondary | ICD-10-CM | POA: Insufficient documentation

## 2019-11-19 DIAGNOSIS — I6782 Cerebral ischemia: Secondary | ICD-10-CM | POA: Diagnosis not present

## 2019-11-19 DIAGNOSIS — N179 Acute kidney failure, unspecified: Secondary | ICD-10-CM | POA: Diagnosis not present

## 2019-11-19 DIAGNOSIS — Z86010 Personal history of colonic polyps: Secondary | ICD-10-CM | POA: Diagnosis not present

## 2019-11-19 DIAGNOSIS — E890 Postprocedural hypothyroidism: Secondary | ICD-10-CM | POA: Diagnosis not present

## 2019-11-19 DIAGNOSIS — Z888 Allergy status to other drugs, medicaments and biological substances status: Secondary | ICD-10-CM | POA: Insufficient documentation

## 2019-11-19 DIAGNOSIS — Y92009 Unspecified place in unspecified non-institutional (private) residence as the place of occurrence of the external cause: Secondary | ICD-10-CM

## 2019-11-19 DIAGNOSIS — W109XXA Fall (on) (from) unspecified stairs and steps, initial encounter: Secondary | ICD-10-CM | POA: Diagnosis not present

## 2019-11-19 DIAGNOSIS — Z885 Allergy status to narcotic agent status: Secondary | ICD-10-CM | POA: Insufficient documentation

## 2019-11-19 DIAGNOSIS — S32512A Fracture of superior rim of left pubis, initial encounter for closed fracture: Secondary | ICD-10-CM | POA: Diagnosis not present

## 2019-11-19 DIAGNOSIS — E1122 Type 2 diabetes mellitus with diabetic chronic kidney disease: Secondary | ICD-10-CM | POA: Insufficient documentation

## 2019-11-19 DIAGNOSIS — Z853 Personal history of malignant neoplasm of breast: Secondary | ICD-10-CM | POA: Diagnosis not present

## 2019-11-19 DIAGNOSIS — N183 Chronic kidney disease, stage 3 unspecified: Secondary | ICD-10-CM | POA: Diagnosis not present

## 2019-11-19 DIAGNOSIS — Z79899 Other long term (current) drug therapy: Secondary | ICD-10-CM | POA: Insufficient documentation

## 2019-11-19 DIAGNOSIS — E785 Hyperlipidemia, unspecified: Secondary | ICD-10-CM | POA: Diagnosis not present

## 2019-11-19 DIAGNOSIS — S32599A Other specified fracture of unspecified pubis, initial encounter for closed fracture: Secondary | ICD-10-CM | POA: Diagnosis present

## 2019-11-19 DIAGNOSIS — E538 Deficiency of other specified B group vitamins: Secondary | ICD-10-CM | POA: Diagnosis not present

## 2019-11-19 DIAGNOSIS — M1990 Unspecified osteoarthritis, unspecified site: Secondary | ICD-10-CM | POA: Insufficient documentation

## 2019-11-19 DIAGNOSIS — Z8249 Family history of ischemic heart disease and other diseases of the circulatory system: Secondary | ICD-10-CM | POA: Insufficient documentation

## 2019-11-19 DIAGNOSIS — Z794 Long term (current) use of insulin: Secondary | ICD-10-CM | POA: Insufficient documentation

## 2019-11-19 DIAGNOSIS — D72829 Elevated white blood cell count, unspecified: Secondary | ICD-10-CM | POA: Diagnosis not present

## 2019-11-19 DIAGNOSIS — W19XXXA Unspecified fall, initial encounter: Secondary | ICD-10-CM | POA: Diagnosis not present

## 2019-11-19 DIAGNOSIS — I10 Essential (primary) hypertension: Secondary | ICD-10-CM | POA: Diagnosis present

## 2019-11-19 LAB — CBC WITH DIFFERENTIAL/PLATELET
Abs Immature Granulocytes: 0.05 10*3/uL (ref 0.00–0.07)
Basophils Absolute: 0.1 10*3/uL (ref 0.0–0.1)
Basophils Relative: 0 %
Eosinophils Absolute: 0.2 10*3/uL (ref 0.0–0.5)
Eosinophils Relative: 1 %
HCT: 33.4 % — ABNORMAL LOW (ref 36.0–46.0)
Hemoglobin: 10.2 g/dL — ABNORMAL LOW (ref 12.0–15.0)
Immature Granulocytes: 0 %
Lymphocytes Relative: 10 %
Lymphs Abs: 1.2 10*3/uL (ref 0.7–4.0)
MCH: 28.7 pg (ref 26.0–34.0)
MCHC: 30.5 g/dL (ref 30.0–36.0)
MCV: 93.8 fL (ref 80.0–100.0)
Monocytes Absolute: 0.8 10*3/uL (ref 0.1–1.0)
Monocytes Relative: 6 %
Neutro Abs: 10.3 10*3/uL — ABNORMAL HIGH (ref 1.7–7.7)
Neutrophils Relative %: 83 %
Platelets: 239 10*3/uL (ref 150–400)
RBC: 3.56 MIL/uL — ABNORMAL LOW (ref 3.87–5.11)
RDW: 13.2 % (ref 11.5–15.5)
WBC: 12.6 10*3/uL — ABNORMAL HIGH (ref 4.0–10.5)
nRBC: 0 % (ref 0.0–0.2)

## 2019-11-19 LAB — URINALYSIS, ROUTINE W REFLEX MICROSCOPIC
Bilirubin Urine: NEGATIVE
Glucose, UA: NEGATIVE mg/dL
Hgb urine dipstick: NEGATIVE
Ketones, ur: NEGATIVE mg/dL
Nitrite: POSITIVE — AB
Protein, ur: 30 mg/dL — AB
Specific Gravity, Urine: 1.016 (ref 1.005–1.030)
pH: 7 (ref 5.0–8.0)

## 2019-11-19 LAB — GLUCOSE, CAPILLARY: Glucose-Capillary: 109 mg/dL — ABNORMAL HIGH (ref 70–99)

## 2019-11-19 LAB — BASIC METABOLIC PANEL
Anion gap: 11 (ref 5–15)
BUN: 30 mg/dL — ABNORMAL HIGH (ref 8–23)
CO2: 28 mmol/L (ref 22–32)
Calcium: 8.7 mg/dL — ABNORMAL LOW (ref 8.9–10.3)
Chloride: 103 mmol/L (ref 98–111)
Creatinine, Ser: 1.81 mg/dL — ABNORMAL HIGH (ref 0.44–1.00)
GFR calc Af Amer: 29 mL/min — ABNORMAL LOW (ref >60)
GFR calc non Af Amer: 25 mL/min — ABNORMAL LOW (ref >60)
Glucose, Bld: 107 mg/dL — ABNORMAL HIGH (ref 70–99)
Potassium: 4.6 mmol/L (ref 3.5–5.1)
Sodium: 142 mmol/L (ref 135–145)

## 2019-11-19 MED ORDER — ADULT MULTIVITAMIN W/MINERALS CH
1.0000 | ORAL_TABLET | Freq: Every morning | ORAL | Status: DC
  Administered 2019-11-20: 1 via ORAL
  Filled 2019-11-19: qty 1

## 2019-11-19 MED ORDER — EZETIMIBE 10 MG PO TABS
10.0000 mg | ORAL_TABLET | Freq: Every day | ORAL | Status: DC
  Administered 2019-11-20: 10 mg via ORAL
  Filled 2019-11-19: qty 1

## 2019-11-19 MED ORDER — SENNOSIDES-DOCUSATE SODIUM 8.6-50 MG PO TABS: 1.0000 | ORAL_TABLET | Freq: Every evening | ORAL | Status: DC | PRN

## 2019-11-19 MED ORDER — SODIUM CHLORIDE 0.9 % IV SOLN: INTRAVENOUS | Status: AC

## 2019-11-19 MED ORDER — METHOCARBAMOL 1000 MG/10ML IJ SOLN
500.0000 mg | Freq: Four times a day (QID) | INTRAVENOUS | Status: DC | PRN
  Filled 2019-11-19: qty 5

## 2019-11-19 MED ORDER — OXYCODONE HCL 5 MG PO TABS
5.0000 mg | ORAL_TABLET | Freq: Once | ORAL | Status: AC
  Administered 2019-11-19: 5 mg via ORAL
  Filled 2019-11-19: qty 1

## 2019-11-19 MED ORDER — INSULIN ASPART 100 UNIT/ML ~~LOC~~ SOLN
0.0000 [IU] | Freq: Every day | SUBCUTANEOUS | Status: DC
  Filled 2019-11-19: qty 0.05

## 2019-11-19 MED ORDER — MORPHINE SULFATE (PF) 2 MG/ML IV SOLN: 0.5000 mg | INTRAVENOUS | Status: DC | PRN

## 2019-11-19 MED ORDER — ISOSORBIDE MONONITRATE ER 30 MG PO TB24
30.0000 mg | ORAL_TABLET | Freq: Every day | ORAL | Status: DC
  Administered 2019-11-20: 30 mg via ORAL
  Filled 2019-11-19: qty 1

## 2019-11-19 MED ORDER — MORPHINE SULFATE (PF) 4 MG/ML IV SOLN
4.0000 mg | Freq: Once | INTRAVENOUS | Status: AC
  Administered 2019-11-19: 19:00:00 4 mg via INTRAVENOUS
  Filled 2019-11-19: qty 1

## 2019-11-19 MED ORDER — CARVEDILOL 25 MG PO TABS
25.0000 mg | ORAL_TABLET | Freq: Two times a day (BID) | ORAL | Status: DC
  Administered 2019-11-20 (×2): 25 mg via ORAL
  Filled 2019-11-19 (×2): qty 1

## 2019-11-19 MED ORDER — METHOCARBAMOL 500 MG PO TABS
500.0000 mg | ORAL_TABLET | Freq: Four times a day (QID) | ORAL | Status: DC | PRN
  Administered 2019-11-19 – 2019-11-20 (×3): 500 mg via ORAL
  Filled 2019-11-19 (×3): qty 1

## 2019-11-19 MED ORDER — INSULIN ASPART 100 UNIT/ML ~~LOC~~ SOLN
0.0000 [IU] | Freq: Three times a day (TID) | SUBCUTANEOUS | Status: DC
  Administered 2019-11-20: 1 [IU] via SUBCUTANEOUS
  Filled 2019-11-19: qty 0.09

## 2019-11-19 MED ORDER — HYDROCODONE-ACETAMINOPHEN 5-325 MG PO TABS: 1.0000 | ORAL_TABLET | Freq: Four times a day (QID) | ORAL | Status: DC | PRN

## 2019-11-19 MED ORDER — ONDANSETRON HCL 4 MG/2ML IJ SOLN
4.0000 mg | Freq: Once | INTRAMUSCULAR | Status: AC
  Administered 2019-11-19: 19:00:00 4 mg via INTRAVENOUS
  Filled 2019-11-19: qty 2

## 2019-11-19 MED ORDER — ACETAMINOPHEN 325 MG PO TABS
650.0000 mg | ORAL_TABLET | Freq: Four times a day (QID) | ORAL | Status: DC | PRN
  Administered 2019-11-19 – 2019-11-20 (×3): 650 mg via ORAL
  Filled 2019-11-19 (×3): qty 2

## 2019-11-19 MED ORDER — PROMETHAZINE HCL 25 MG/ML IJ SOLN: 12.5000 mg | Freq: Four times a day (QID) | INTRAMUSCULAR | Status: DC | PRN

## 2019-11-19 MED ORDER — ACETAMINOPHEN 500 MG PO TABS
1000.0000 mg | ORAL_TABLET | Freq: Once | ORAL | Status: AC
  Administered 2019-11-19: 1000 mg via ORAL
  Filled 2019-11-19: qty 2

## 2019-11-19 MED ORDER — LEVOTHYROXINE SODIUM 100 MCG PO TABS
100.0000 ug | ORAL_TABLET | Freq: Every day | ORAL | Status: DC
  Administered 2019-11-20: 07:00:00 100 ug via ORAL
  Filled 2019-11-19: qty 1

## 2019-11-19 NOTE — ED Provider Notes (Signed)
[facility] DEPT Provider Note   CSN: [id number] Arrival date & time: 11/19/19  1611     History Chief Complaint  Patient presents with  . Fall  . Hip Pain    Alisha [name] is a 85 y.o. female.  85 yo F with a chief complaint of left hip pain.  The patient states that she missed a step and fell onto her left side.  Has a history of a left hip replacement.  She denies head injury denies loss consciousness denies neck pain back pain chest pain abdominal pain.  Had pain with movement and palpation.  The history is provided by the patient.  Fall This is a new problem. The current episode started less than 1 hour ago. The problem occurs constantly. The problem has not changed since onset.Pertinent negatives include no chest pain, no abdominal pain, no headaches and no shortness of breath. The symptoms are aggravated by walking, bending and twisting. She has tried nothing for the symptoms. The treatment provided no relief.  Hip Pain Pertinent negatives include no chest pain, no abdominal pain, no headaches and no shortness of breath.       Past Medical History:  Diagnosis Date  . Anemia    takes Ferrous Sulfate daily  . Arthritis   . B12 deficiency    takes Vit B12 Q3 months  . Blood transfusion   . Colon polyps   . Diabetes mellitus    takes Metformin and Glipizide daily  . Hemorrhoids   . History of blood transfusion    no abnormal reaction noted  . Hyperlipidemia    takes Zetia daily  . Hypertension    takes Diovan and Metoprolol daily  . Joint pain   . Multiple thyroid nodules    takes Synthroid daily  . Osteoporosis    takes Fosamax every Wed  . Peripheral vascular disease ([place])    DVT  2008 with ? anticoagulation  . PONV (postoperative nausea and vomiting)     Patient Active Problem List   Diagnosis Date Noted  . Pubic ramus fracture ([place]) 11/19/2019  . Fall at home, initial encounter 11/19/2019  . AKI (acute kidney injury)  ([place]) 11/19/2019  . Leukocytosis 11/19/2019  . Small vessel disease, cerebrovascular 07/02/2018  . Acute metabolic encephalopathy [id number]  . Confusion 06/10/2018  . Hypertension   . B12 deficiency   . Breast cancer ([place]) 05/12/2014  . Breast cancer of upper-outer quadrant of right female breast ([place]) 04/26/2014  . Hypothyroidism, postsurgical 02/04/2012  . Multinodular goiter (nontoxic), atypia on cytopathology 10/18/2011    Past Surgical History:  Procedure Laterality Date  . COLONOSCOPY WITH PROPOFOL N/A 12/06/2014   Procedure: COLONOSCOPY WITH PROPOFOL;  Surgeon: Garlan Fair, MD;  Location: [facility];  Service: Endoscopy;  Laterality: N/A;  . ESOPHAGOGASTRODUODENOSCOPY (EGD) WITH PROPOFOL N/A 12/06/2014   Procedure: ESOPHAGOGASTRODUODENOSCOPY (EGD) WITH PROPOFOL;  Surgeon: Garlan Fair, MD;  Location: [facility];  Service: Endoscopy;  Laterality: N/A;  . FEMUR SURGERY Left 07/08/11   plates and screws  . FRACTURE SURGERY     8/12 femur fx with rod and bolt placed  . HEMORRHOIDECTOMY WITH HEMORRHOID BANDING    . HERNIA REPAIR  1945  . IR ANGIO EXTRACRAN SEL COM CAROTID INNOMINATE UNI BILAT MOD SED  06/13/2018  . IR ANGIO VERTEBRAL SEL SUBCLAVIAN INNOMINATE UNI R MOD SED  06/13/2018  . IR ANGIO VERTEBRAL SEL VERTEBRAL UNI L MOD SED  06/13/2018  . MASTECTOMY W/ SENTINEL  NODE BIOPSY Right 05/13/2014   Procedure: MASTECTOMY WITH SENTINEL LYMPH NODE BIOPSY;  Surgeon: Stark Klein, MD;  Location: [facility];  Service: General;  Laterality: Right;  . THYROIDECTOMY  12/04/2011   Procedure: THYROIDECTOMY;  Surgeon: Earnstine Regal, MD;  Location: [facility];  Service: General;  Laterality: N/A;  total thyroidectomy     OB History   No obstetric history on file.     Family History  Problem Relation Age of Onset  . Hypertension Mother   . Hypertension Father     Social History   Tobacco Use  . Smoking status: Never Smoker  . Smokeless tobacco: Never Used  Substance Use Topics    . Alcohol use: No  . Drug use: No    Home Medications Prior to Admission medications   Medication Sig Start Date End Date Taking? Authorizing Provider  aspirin EC 81 MG tablet Take 1 tablet (81 mg total) by mouth daily. 06/10/18  Yes Jola Schmidt, MD  carvedilol (COREG) 25 MG tablet Take 1 tablet (25 mg total) by mouth 2 (two) times daily with a meal. 06/16/18  Yes Florencia Reasons, MD  cyanocobalamin (,VITAMIN B-12,) 1000 MCG/ML injection Inject 1,000 mcg into the muscle every 3 (three) months.   Yes [provider]  isosorbide mononitrate (IMDUR) 30 MG 24 hr tablet Take 1 tablet (30 mg total) by mouth daily. 06/17/18  Yes Florencia Reasons, MD  levothyroxine (SYNTHROID) 100 MCG tablet Take 100 mcg by mouth every morning. 11/02/19  Yes [provider]  losartan (COZAAR) 100 MG tablet Take 1 tablet (100 mg total) by mouth daily. 06/17/18  Yes Florencia Reasons, MD  metFORMIN (GLUCOPHAGE) 850 MG tablet Take 1 tablet (850 mg total) by mouth 2 (two) times daily with a meal. 06/16/18  Yes Florencia Reasons, MD  Multiple Vitamin (MULTIVITAMIN WITH MINERALS) TABS tablet Take 1 tablet by mouth every morning.   Yes [provider]  senna-docusate (SENOKOT-S) 8.6-50 MG tablet Take 1 tablet by mouth at bedtime. Patient taking differently: Take 1 tablet by mouth at bedtime as needed for mild constipation.  06/16/18  Yes Florencia Reasons, MD  ZETIA 10 MG tablet Take 10 mg by mouth daily before breakfast.  09/28/11  Yes [provider]  ferrous sulfate 325 (65 FE) MG tablet Take 1 tablet (325 mg total) by mouth daily with breakfast. Patient not taking: Reported on 11/19/2019 06/17/18   Florencia Reasons, MD  UNABLE TO FIND 174.9 Right Mastectomy   L8000- Post Surgical Bras-6  [id number]- Silicone Breast Prosthesis-1 07/21/14   Stark Klein, MD    Allergies    Ace inhibitors, Codeine, Evista [raloxifene hydrochloride], and Zocor [simvastatin - high dose]  Review of Systems   Review of Systems  Constitutional: Negative for  chills and fever.  HENT: Negative for congestion and rhinorrhea.   Eyes: Negative for redness and visual disturbance.  Respiratory: Negative for shortness of breath and wheezing.   Cardiovascular: Negative for chest pain and palpitations.  Gastrointestinal: Negative for abdominal pain, nausea and vomiting.  Genitourinary: Negative for dysuria and urgency.  Musculoskeletal: Positive for arthralgias, gait problem and myalgias.  Skin: Negative for pallor and wound.  Neurological: Negative for dizziness and headaches.    Physical Exam Updated Vital Signs BP (!) 166/92 (BP Location: Left Arm)   Pulse 73   Temp 98 F (36.7 C) (Oral)   Resp 18   SpO2 100%   Physical Exam Vitals and nursing note reviewed.  Constitutional:  General: She is not in acute distress.    Appearance: She is well-developed. She is not diaphoretic.  HENT:     Head: Normocephalic and atraumatic.  Eyes:     Pupils: Pupils are equal, round, and reactive to light.  Cardiovascular:     Rate and Rhythm: Normal rate and regular rhythm.     Heart sounds: No murmur. No friction rub. No gallop.   Pulmonary:     Effort: Pulmonary effort is normal.     Breath sounds: No wheezing or rales.  Abdominal:     General: There is no distension.     Palpations: Abdomen is soft.     Tenderness: There is no abdominal tenderness.  Musculoskeletal:        General: Tenderness present.     Cervical back: Normal range of motion and neck supple.     Comments: Some pain with internal and external rotation of the left lower extremity.  No pain with palpation of the pelvis.  Pulse motor and sensation are intact distally.  She has full range of motion and no bony tenderness to the left elbow.  Skin:    General: Skin is warm and dry.  Neurological:     Mental Status: She is alert and oriented to person, place, and time.  Psychiatric:        Behavior: Behavior normal.     ED Results / Procedures / Treatments   Labs (all labs  ordered are listed, but only abnormal results are displayed) Labs Reviewed  CBC WITH DIFFERENTIAL/PLATELET - Abnormal; Notable for the following components:      Result Value   WBC 12.6 (*)    RBC 3.56 (*)    Hemoglobin 10.2 (*)    HCT 33.4 (*)    Neutro Abs 10.3 (*)    All other components within normal limits  BASIC METABOLIC PANEL - Abnormal; Notable for the following components:   Glucose, Bld 107 (*)    BUN 30 (*)    Creatinine, Ser 1.81 (*)    Calcium 8.7 (*)    GFR calc non Af Amer 25 (*)    GFR calc Af Amer 29 (*)    All other components within normal limits  URINALYSIS, ROUTINE W REFLEX MICROSCOPIC - Abnormal; Notable for the following components:   Protein, ur 30 (*)    Nitrite POSITIVE (*)    Leukocytes,Ua SMALL (*)    Bacteria, UA MANY (*)    All other components within normal limits  SARS CORONAVIRUS 2 (TAT 6-24 HRS)  HEMOGLOBIN [id number]  BASIC METABOLIC PANEL  CBC    EKG None  Radiology CT PELVIS WO CONTRAST  Result Date: 11/19/2019 CLINICAL DATA:  Pelvic pain after fall EXAM: CT PELVIS WITHOUT CONTRAST TECHNIQUE: Multidetector CT imaging of the pelvis was performed following the standard protocol without intravenous contrast. COMPARISON:  X-ray 11/19/2019 FINDINGS: Acute minimally displaced fractures of the left superior and inferior pubic rami (series 6, image 37; series 2, image 113). Fracture does not appear to involve the pubic symphysis. Symphysis is intact without widening. The bilateral SI joints are intact without diastasis. Patient is status post prior ORIF of the proximal left femur with chronic posttraumatic deformity. No acute fracture or dislocation of the left hip. There is thin, sub-threshold perihardware lucency about the cephalomedullary screw within the left femoral head. There is fullness in the left pelvic sidewall suggestive of a small sidewall hematoma. Otherwise, no acute findings within the pelvis. No free fluid within  the pelvis. There is left  gluteal muscular atrophy, likely postsurgical. IMPRESSION: 1. Acute minimally displaced fractures of the left superior and inferior pubic rami. 2. Small left pelvic sidewall hematoma. Electronically Signed   By: Davina Poke D.O.   On: 11/19/2019 18:58   DG Hip Unilat W or Wo Pelvis 2-3 Views Left  Result Date: 11/19/2019 CLINICAL DATA:  Left hip pain after fall today. EXAM: DG HIP (WITH OR WITHOUT PELVIS) 2-3V LEFT COMPARISON:  August 03, 2011. FINDINGS: Status post surgical internal fixation of old healed proximal left femoral fracture. No acute fracture or dislocation is noted. Right hip is unremarkable. IMPRESSION: Status post surgical internal fixation of old healed proximal left femoral fracture. No acute abnormality is noted. Electronically Signed   By: Marijo Conception M.D.   On: 11/19/2019 17:25    Procedures Procedures (including critical care time)  Medications Ordered in ED Medications  carvedilol (COREG) tablet 25 mg (has no administration in time range)  isosorbide mononitrate (IMDUR) 24 hr tablet 30 mg (has no administration in time range)  ezetimibe (ZETIA) tablet 10 mg (has no administration in time range)  levothyroxine (SYNTHROID) tablet 100 mcg (has no administration in time range)  senna-docusate (Senokot-S) tablet 1 tablet (has no administration in time range)  multivitamin with minerals tablet 1 tablet (has no administration in time range)  morphine 2 MG/ML injection 0.5 mg (has no administration in time range)  promethazine (PHENERGAN) injection 12.5 mg (has no administration in time range)  methocarbamol (ROBAXIN) tablet 500 mg (has no administration in time range)    Or  methocarbamol (ROBAXIN) 500 mg in dextrose 5 % 50 mL IVPB (has no administration in time range)  insulin aspart (novoLOG) injection 0-9 Units (has no administration in time range)  insulin aspart (novoLOG) injection 0-5 Units (has no administration in time range)  0.9 %  sodium chloride  infusion (has no administration in time range)  acetaminophen (TYLENOL) tablet 650 mg (has no administration in time range)  acetaminophen (TYLENOL) tablet 1,000 mg (1,000 mg Oral Given 11/19/19 1721)  oxyCODONE (Oxy IR/ROXICODONE) immediate release tablet 5 mg (5 mg Oral Given 11/19/19 1721)  morphine 4 MG/ML injection 4 mg (4 mg Intravenous Given 11/19/19 1913)  ondansetron (ZOFRAN) injection 4 mg (4 mg Intravenous Given 11/19/19 1912)    ED Course  I have reviewed the triage vital signs and the nursing notes.  Pertinent labs & imaging results that were available during my care of the patient were reviewed by me and considered in my medical decision making (see chart for details).    MDM Rules/Calculators/A&P                      85 yo F with a chief complaint of a fall from standing.  Nonsyncopal in nature.  Complaining of left hip pain.  Had a hip repair done to that hip about 8 years ago. Obtain plain film, pain meds, reassess.   Plain film viewed by me with the superior and inferior pubic ramus fracture on the left.  Not seen on prior x-ray.  Radiology read without obvious fracture.  Will CT.  Patient is unable to ambulate.  Likely will need admission for pain control physical therapy.  Discussed case with Dr. Berenice Primas, orthopedics.  Recommended weightbearing as tolerated.  He will come and evaluate the patient tomorrow.  The patients results and plan were reviewed and discussed.   Any x-rays performed were independently reviewed by myself.  Differential diagnosis were considered with the presenting HPI.  Medications  carvedilol (COREG) tablet 25 mg (has no administration in time range)  isosorbide mononitrate (IMDUR) 24 hr tablet 30 mg (has no administration in time range)  ezetimibe (ZETIA) tablet 10 mg (has no administration in time range)  levothyroxine (SYNTHROID) tablet 100 mcg (has no administration in time range)  senna-docusate (Senokot-S) tablet 1 tablet (has no  administration in time range)  multivitamin with minerals tablet 1 tablet (has no administration in time range)  morphine 2 MG/ML injection 0.5 mg (has no administration in time range)  promethazine (PHENERGAN) injection 12.5 mg (has no administration in time range)  methocarbamol (ROBAXIN) tablet 500 mg (has no administration in time range)    Or  methocarbamol (ROBAXIN) 500 mg in dextrose 5 % 50 mL IVPB (has no administration in time range)  insulin aspart (novoLOG) injection 0-9 Units (has no administration in time range)  insulin aspart (novoLOG) injection 0-5 Units (has no administration in time range)  0.9 %  sodium chloride infusion (has no administration in time range)  acetaminophen (TYLENOL) tablet 650 mg (has no administration in time range)  acetaminophen (TYLENOL) tablet 1,000 mg (1,000 mg Oral Given 11/19/19 1721)  oxyCODONE (Oxy IR/ROXICODONE) immediate release tablet 5 mg (5 mg Oral Given 11/19/19 1721)  morphine 4 MG/ML injection 4 mg (4 mg Intravenous Given 11/19/19 1913)  ondansetron (ZOFRAN) injection 4 mg (4 mg Intravenous Given 11/19/19 1912)    Vitals:   11/19/19 1629 11/19/19 1915 11/19/19 2100 11/19/19 2222  BP: (!) 174/88 (!) 155/86 (!) 159/92 (!) 166/92  Pulse: 74 77 78 73  Resp: 16 16 15 18   Temp: 98.7 F (37.1 C)   98 F (36.7 C)  TempSrc: Oral   Oral  SpO2: 97% 95% 91% 100%    Final diagnoses:  Pubic ramus fracture, left, closed, initial encounter [facility])    Admission/ observation were discussed with the admitting physician, patient and/or family and they are comfortable with the plan.   Final Clinical Impression(s) / ED Diagnoses Final diagnoses:  Pubic ramus fracture, left, closed, initial encounter [facility])    Rx / [place] Orders ED Discharge Orders    None       Deno Etienne, DO 11/19/19 2225

## 2019-11-19 NOTE — H&P (Addendum)
History and Physical    Alisha [name] [id number] DOB: February 12, 1932 DOA: 11/19/2019  PCP: Kelton Pillar, MD  Chief Complaint: Left hip pain/pelvic pain, fall  HPI: Alisha [name] is a 85 y.o. female with medical history significant of anemia, arthritis, B12 deficiency, non-insulin-dependent type 2 diabetes, hyperlipidemia, hypertension, hypothyroidism, PVD presenting to the ED via EMS for evaluation of left hip/pelvic pain after a fall.  Patient states she was walking back from the nail salon and while stepping off the curb somehow missed a step and fell on her left hip.  Since then she is having pain in this area.  Denies any head injury from the fall.  Denies headaches, neck pain, or back pain.  Denies loss of consciousness.  Patient states she was in her usual state of health and has not been ill recently.  She was not having any lightheadedness/dizziness, chest pain, shortness of breath, or palpitations at the time of the fall.  No cough, nausea, vomiting, abdominal pain, or diarrhea.  Triage note mentions left elbow pain but patient denies any pain in this area and states she has no problem moving her elbow joint.  ED Course: Afebrile.  WBC count 12.6.  Hemoglobin 10.2, no significant change from baseline.  Blood glucose 107.  BUN 30, creatinine 1.8.  Creatinine was ranging between 1.9-1.4 in July 2019.  SARS-CoV-2 PCR test pending.  CT showing acute minimally displaced fractures of the left superior and inferior pubic rami.  Small left pelvic sidewall hematoma.  Review of Systems:  All systems reviewed and apart from history of presenting illness, are negative.  Past Medical History:  Diagnosis Date  . Anemia    takes Ferrous Sulfate daily  . Arthritis   . B12 deficiency    takes Vit B12 Q3 months  . Blood transfusion   . Colon polyps   . Diabetes mellitus    takes Metformin and Glipizide daily  . Hemorrhoids   . History of blood transfusion    no abnormal reaction noted  .  Hyperlipidemia    takes Zetia daily  . Hypertension    takes Diovan and Metoprolol daily  . Joint pain   . Multiple thyroid nodules    takes Synthroid daily  . Osteoporosis    takes Fosamax every Wed  . Peripheral vascular disease ([place])    DVT  2008 with ? anticoagulation  . PONV (postoperative nausea and vomiting)     Past Surgical History:  Procedure Laterality Date  . COLONOSCOPY WITH PROPOFOL N/A 12/06/2014   Procedure: COLONOSCOPY WITH PROPOFOL;  Surgeon: Garlan Fair, MD;  Location: [facility];  Service: Endoscopy;  Laterality: N/A;  . ESOPHAGOGASTRODUODENOSCOPY (EGD) WITH PROPOFOL N/A 12/06/2014   Procedure: ESOPHAGOGASTRODUODENOSCOPY (EGD) WITH PROPOFOL;  Surgeon: Garlan Fair, MD;  Location: [facility];  Service: Endoscopy;  Laterality: N/A;  . FEMUR SURGERY Left 07/08/11   plates and screws  . FRACTURE SURGERY     8/12 femur fx with rod and bolt placed  . HEMORRHOIDECTOMY WITH HEMORRHOID BANDING    . HERNIA REPAIR  1945  . IR ANGIO EXTRACRAN SEL COM CAROTID INNOMINATE UNI BILAT MOD SED  06/13/2018  . IR ANGIO VERTEBRAL SEL SUBCLAVIAN INNOMINATE UNI R MOD SED  06/13/2018  . IR ANGIO VERTEBRAL SEL VERTEBRAL UNI L MOD SED  06/13/2018  . MASTECTOMY W/ SENTINEL NODE BIOPSY Right 05/13/2014   Procedure: MASTECTOMY WITH SENTINEL LYMPH NODE BIOPSY;  Surgeon: Stark Klein, MD;  Location: [place];  Service: General;  Laterality: Right;  . THYROIDECTOMY  12/04/2011   Procedure: THYROIDECTOMY;  Surgeon: Earnstine Regal, MD;  Location: [facility];  Service: General;  Laterality: N/A;  total thyroidectomy     reports that she has never smoked. She has never used smokeless tobacco. She reports that she does not drink alcohol or use drugs.  Allergies  Allergen Reactions  . Ace Inhibitors Cough  . Codeine Nausea Only  . Evista [Raloxifene Hydrochloride] Other (See Comments)    cramps   . Zocor [Simvastatin - High Dose] Other (See Comments)    Cramps     Family History  Problem  Relation Age of Onset  . Hypertension Mother   . Hypertension Father     Prior to Admission medications   Medication Sig Start Date End Date Taking? Authorizing Provider  aspirin EC 81 MG tablet Take 1 tablet (81 mg total) by mouth daily. 06/10/18  Yes Jola Schmidt, MD  carvedilol (COREG) 25 MG tablet Take 1 tablet (25 mg total) by mouth 2 (two) times daily with a meal. 06/16/18  Yes Florencia Reasons, MD  cyanocobalamin (,VITAMIN B-12,) 1000 MCG/ML injection Inject 1,000 mcg into the muscle every 3 (three) months.   Yes [provider]  isosorbide mononitrate (IMDUR) 30 MG 24 hr tablet Take 1 tablet (30 mg total) by mouth daily. 06/17/18  Yes Florencia Reasons, MD  levothyroxine (SYNTHROID) 100 MCG tablet Take 100 mcg by mouth every morning. 11/02/19  Yes [provider]  losartan (COZAAR) 100 MG tablet Take 1 tablet (100 mg total) by mouth daily. 06/17/18  Yes Florencia Reasons, MD  metFORMIN (GLUCOPHAGE) 850 MG tablet Take 1 tablet (850 mg total) by mouth 2 (two) times daily with a meal. 06/16/18  Yes Florencia Reasons, MD  Multiple Vitamin (MULTIVITAMIN WITH MINERALS) TABS tablet Take 1 tablet by mouth every morning.   Yes [provider]  senna-docusate (SENOKOT-S) 8.6-50 MG tablet Take 1 tablet by mouth at bedtime. Patient taking differently: Take 1 tablet by mouth at bedtime as needed for mild constipation.  06/16/18  Yes Florencia Reasons, MD  ZETIA 10 MG tablet Take 10 mg by mouth daily before breakfast.  09/28/11  Yes [provider]  ferrous sulfate 325 (65 FE) MG tablet Take 1 tablet (325 mg total) by mouth daily with breakfast. Patient not taking: Reported on 11/19/2019 06/17/18   Florencia Reasons, MD  UNABLE TO FIND 174.9 Right Mastectomy   L8000- Post Surgical Bras-6  [id number]- Silicone Breast Prosthesis-1 07/21/14   Stark Klein, MD    Physical Exam: Vitals:   11/19/19 1629 11/19/19 1915  BP: (!) 174/88 (!) 155/86  Pulse: 74 77  Resp: 16 16  Temp: 98.7 F (37.1 C)   TempSrc: Oral   SpO2: 97%  95%    Physical Exam  Constitutional: She is oriented to person, place, and time. She appears well-developed and well-nourished. No distress.  HENT:  Head: Normocephalic.  Mouth/Throat: Oropharynx is clear and moist.  Eyes: Right eye exhibits no discharge. Left eye exhibits no discharge.  Cardiovascular: Normal rate, regular rhythm and intact distal pulses.  Pulmonary/Chest: Effort normal and breath sounds normal. No respiratory distress. She has no wheezes. She has no rales.  Abdominal: Soft. Bowel sounds are normal. She exhibits no distension. There is no abdominal tenderness. There is no guarding.  Musculoskeletal:     Cervical back: Neck supple.     Comments: +1 pitting edema of bilateral lower extremities (chronic per patient) Left elbow: No obvious deformity,  normal range of motion, nontender to palpation  Neurological: She is alert and oriented to person, place, and time.  Skin: Skin is warm and dry. She is not diaphoretic.     Labs on Admission: I have personally reviewed following labs and imaging studies  CBC: Recent Labs  Lab 11/19/19 1925  WBC 12.6*  NEUTROABS 10.3*  HGB 10.2*  HCT 33.4*  MCV 93.8  PLT [id number]   Basic Metabolic Panel: Recent Labs  Lab 11/19/19 1925  NA 142  K 4.6  CL 103  CO2 28  GLUCOSE 107*  BUN 30*  CREATININE 1.81*  CALCIUM 8.7*   GFR: CrCl cannot be calculated (Unknown ideal weight.). Liver Function Tests: No results for input(s): AST, ALT, ALKPHOS, BILITOT, PROT, ALBUMIN in the last 168 hours. No results for input(s): LIPASE, AMYLASE in the last 168 hours. No results for input(s): AMMONIA in the last 168 hours. Coagulation Profile: No results for input(s): INR, PROTIME in the last 168 hours. Cardiac Enzymes: No results for input(s): CKTOTAL, CKMB, CKMBINDEX, TROPONINI in the last 168 hours. BNP (last 3 results) No results for input(s): PROBNP in the last 8760 hours. HbA1C: No results for input(s): HGBA1C in the last 72  hours. CBG: No results for input(s): GLUCAP in the last 168 hours. Lipid Profile: No results for input(s): CHOL, HDL, LDLCALC, TRIG, CHOLHDL, LDLDIRECT in the last 72 hours. Thyroid Function Tests: No results for input(s): TSH, T4TOTAL, FREET4, T3FREE, THYROIDAB in the last 72 hours. Anemia Panel: No results for input(s): VITAMINB12, FOLATE, FERRITIN, TIBC, IRON, RETICCTPCT in the last 72 hours. Urine analysis:    Component Value Date/Time   COLORURINE YELLOW 06/10/2018 1709   APPEARANCEUR CLEAR 06/10/2018 1709   LABSPEC 1.014 06/10/2018 1709   PHURINE 7.0 06/10/2018 1709   GLUCOSEU NEGATIVE 06/10/2018 1709   HGBUR NEGATIVE 06/10/2018 1709   BILIRUBINUR NEGATIVE 06/10/2018 1709   KETONESUR NEGATIVE 06/10/2018 1709   PROTEINUR NEGATIVE 06/10/2018 1709   UROBILINOGEN 0.2 07/08/2011 1426   NITRITE NEGATIVE 06/10/2018 1709   LEUKOCYTESUR NEGATIVE 06/10/2018 1709    Radiological Exams on Admission: CT PELVIS WO CONTRAST  Result Date: 11/19/2019 CLINICAL DATA:  Pelvic pain after fall EXAM: CT PELVIS WITHOUT CONTRAST TECHNIQUE: Multidetector CT imaging of the pelvis was performed following the standard protocol without intravenous contrast. COMPARISON:  X-ray 11/19/2019 FINDINGS: Acute minimally displaced fractures of the left superior and inferior pubic rami (series 6, image 37; series 2, image 113). Fracture does not appear to involve the pubic symphysis. Symphysis is intact without widening. The bilateral SI joints are intact without diastasis. Patient is status post prior ORIF of the proximal left femur with chronic posttraumatic deformity. No acute fracture or dislocation of the left hip. There is thin, sub-threshold perihardware lucency about the cephalomedullary screw within the left femoral head. There is fullness in the left pelvic sidewall suggestive of a small sidewall hematoma. Otherwise, no acute findings within the pelvis. No free fluid within the pelvis. There is left gluteal  muscular atrophy, likely postsurgical. IMPRESSION: 1. Acute minimally displaced fractures of the left superior and inferior pubic rami. 2. Small left pelvic sidewall hematoma. Electronically Signed   By: Davina Poke D.O.   On: 11/19/2019 18:58   DG Hip Unilat W or Wo Pelvis 2-3 Views Left  Result Date: 11/19/2019 CLINICAL DATA:  Left hip pain after fall today. EXAM: DG HIP (WITH OR WITHOUT PELVIS) 2-3V LEFT COMPARISON:  August 03, 2011. FINDINGS: Status post surgical internal fixation of old healed proximal  left femoral fracture. No acute fracture or dislocation is noted. Right hip is unremarkable. IMPRESSION: Status post surgical internal fixation of old healed proximal left femoral fracture. No acute abnormality is noted. Electronically Signed   By: Marijo Conception M.D.   On: 11/19/2019 17:25    Assessment/Plan Principal Problem:   Pubic ramus fracture (HCC) Active Problems:   Hypertension   Fall at home, initial encounter   AKI (acute kidney injury) ([place])   Leukocytosis   Pubic rami fractures secondary to a mechanical fall CT showing acute minimally displaced fractures of the left superior and inferior pubic rami.  Small left pelvic sidewall hematoma. -Pain management: Morphine as needed, Tylenol as needed -Robaxin as needed -PT and OT evaluation -Bowel regimen -Ortho will consult in a.m. Recommended weightbearing as tolerated.  AKI on CKD stage III BUN 30, creatinine 1.8.  Creatinine was ranging between 1.9-1.4 in July 2019.  Does take losartan at home. -Gentle IV fluid hydration -Continue to monitor renal function -Monitor urine output -Avoid nephrotoxic agents  Mild leukocytosis Likely reactive.  Patient is afebrile.  WBC count 12.6.  Lungs clear and not complaining of cough or shortness of breath. -Check UA to rule out UTI -Continue to monitor WBC count  Addendum: UA suggestive of infection with positive nitrite, small amount of leukocytes, 6-10 WBCs, and many  bacteria.  Start ceftriaxone.  Urine culture ordered.  Non-insulin-dependent diabetes mellitus -Check A1c.  Sliding scale insulin sensitive ACHS and CBG checks.  Hypertension -Continue home Coreg, Imdur  Hyperlipidemia -Continue home Zetia  Hypothyroidism -Continue Synthroid  DVT prophylaxis: SCDs at this time Code Status: Patient wishes to be full code. Family Communication: No family at bedside. Disposition Plan: Anticipate discharge after clinical improvement. Consults called: Orthopedics (Dr. Berenice Primas) Admission status: It is my clinical opinion that referral for OBSERVATION is reasonable and necessary in this patient based on the above information provided. The aforementioned taken together are felt to place the patient at high risk for further clinical deterioration. However it is anticipated that the patient may be medically stable for discharge from the hospital within 24 to 48 hours.  The medical decision making on this patient was of high complexity and the patient is at high risk for clinical deterioration, therefore this is a level 3 visit.  Shela Leff MD [facility] Pager [phone]  If 7PM-7AM, please contact night-coverage [email or web address] Password [facility]  11/19/2019, 8:47 PM

## 2019-11-19 NOTE — ED Notes (Signed)
Patient transported to CT 

## 2019-11-19 NOTE — ED Triage Notes (Signed)
85 yo BIB [facility] from the parking lot of nail salon. Pt was walking back from nail salon misstep off curb and fell onto left side and hit hip and left elbow. Pt states she broke left hip 8 years ago in 2012. Pt states she has some shortening of the left lower extremity due to reconstructive surgery. Pt is complaining of issues weight bearing on that side. Pt rated pain as 6/10 as per EMS. Pt denies hitting head, pt denies any LOC, and also denies taking any blood thinners. Pt is AOx4  Last vitals with EMS: bp 170 palpated Hr 74 spo2 100 room air cbg 106 Temp 97.5 rr 16

## 2019-11-19 NOTE — ED Notes (Signed)
Attempted to assist pt with ambulation to restroom using a walker but pt states unable to put weight on the left leg at all and was unable to ambulate to restroom.

## 2019-11-19 NOTE — ED Notes (Signed)
Patient transported to X-ray 

## 2019-11-20 DIAGNOSIS — S32599A Other specified fracture of unspecified pubis, initial encounter for closed fracture: Secondary | ICD-10-CM | POA: Diagnosis present

## 2019-11-20 DIAGNOSIS — I129 Hypertensive chronic kidney disease with stage 1 through stage 4 chronic kidney disease, or unspecified chronic kidney disease: Secondary | ICD-10-CM | POA: Diagnosis not present

## 2019-11-20 DIAGNOSIS — R2681 Unsteadiness on feet: Secondary | ICD-10-CM | POA: Diagnosis not present

## 2019-11-20 DIAGNOSIS — N183 Chronic kidney disease, stage 3 unspecified: Secondary | ICD-10-CM | POA: Diagnosis not present

## 2019-11-20 DIAGNOSIS — S32512A Fracture of superior rim of left pubis, initial encounter for closed fracture: Secondary | ICD-10-CM | POA: Diagnosis not present

## 2019-11-20 DIAGNOSIS — Z86010 Personal history of colonic polyps: Secondary | ICD-10-CM | POA: Diagnosis not present

## 2019-11-20 DIAGNOSIS — N179 Acute kidney failure, unspecified: Secondary | ICD-10-CM | POA: Diagnosis not present

## 2019-11-20 DIAGNOSIS — Z885 Allergy status to narcotic agent status: Secondary | ICD-10-CM | POA: Diagnosis not present

## 2019-11-20 DIAGNOSIS — Z8249 Family history of ischemic heart disease and other diseases of the circulatory system: Secondary | ICD-10-CM | POA: Diagnosis not present

## 2019-11-20 DIAGNOSIS — E538 Deficiency of other specified B group vitamins: Secondary | ICD-10-CM | POA: Diagnosis not present

## 2019-11-20 DIAGNOSIS — E1151 Type 2 diabetes mellitus with diabetic peripheral angiopathy without gangrene: Secondary | ICD-10-CM | POA: Diagnosis not present

## 2019-11-20 DIAGNOSIS — Z794 Long term (current) use of insulin: Secondary | ICD-10-CM | POA: Diagnosis not present

## 2019-11-20 DIAGNOSIS — Z7982 Long term (current) use of aspirin: Secondary | ICD-10-CM | POA: Diagnosis not present

## 2019-11-20 DIAGNOSIS — W19XXXA Unspecified fall, initial encounter: Secondary | ICD-10-CM

## 2019-11-20 DIAGNOSIS — I6782 Cerebral ischemia: Secondary | ICD-10-CM | POA: Diagnosis not present

## 2019-11-20 DIAGNOSIS — E785 Hyperlipidemia, unspecified: Secondary | ICD-10-CM | POA: Diagnosis not present

## 2019-11-20 DIAGNOSIS — E890 Postprocedural hypothyroidism: Secondary | ICD-10-CM | POA: Diagnosis not present

## 2019-11-20 DIAGNOSIS — Z888 Allergy status to other drugs, medicaments and biological substances status: Secondary | ICD-10-CM | POA: Diagnosis not present

## 2019-11-20 DIAGNOSIS — D72829 Elevated white blood cell count, unspecified: Secondary | ICD-10-CM | POA: Diagnosis not present

## 2019-11-20 DIAGNOSIS — W109XXA Fall (on) (from) unspecified stairs and steps, initial encounter: Secondary | ICD-10-CM | POA: Diagnosis not present

## 2019-11-20 DIAGNOSIS — Y92009 Unspecified place in unspecified non-institutional (private) residence as the place of occurrence of the external cause: Secondary | ICD-10-CM

## 2019-11-20 DIAGNOSIS — Z20828 Contact with and (suspected) exposure to other viral communicable diseases: Secondary | ICD-10-CM | POA: Diagnosis not present

## 2019-11-20 DIAGNOSIS — E1122 Type 2 diabetes mellitus with diabetic chronic kidney disease: Secondary | ICD-10-CM | POA: Diagnosis not present

## 2019-11-20 DIAGNOSIS — M1990 Unspecified osteoarthritis, unspecified site: Secondary | ICD-10-CM | POA: Diagnosis not present

## 2019-11-20 DIAGNOSIS — Z79899 Other long term (current) drug therapy: Secondary | ICD-10-CM | POA: Diagnosis not present

## 2019-11-20 DIAGNOSIS — Z853 Personal history of malignant neoplasm of breast: Secondary | ICD-10-CM | POA: Diagnosis not present

## 2019-11-20 DIAGNOSIS — S32592A Other specified fracture of left pubis, initial encounter for closed fracture: Secondary | ICD-10-CM | POA: Diagnosis not present

## 2019-11-20 LAB — CBC
HCT: 32.4 % — ABNORMAL LOW (ref 36.0–46.0)
Hemoglobin: 9.7 g/dL — ABNORMAL LOW (ref 12.0–15.0)
MCH: 28.4 pg (ref 26.0–34.0)
MCHC: 29.9 g/dL — ABNORMAL LOW (ref 30.0–36.0)
MCV: 94.7 fL (ref 80.0–100.0)
Platelets: 216 10*3/uL (ref 150–400)
RBC: 3.42 MIL/uL — ABNORMAL LOW (ref 3.87–5.11)
RDW: 13.2 % (ref 11.5–15.5)
WBC: 8.3 10*3/uL (ref 4.0–10.5)
nRBC: 0 % (ref 0.0–0.2)

## 2019-11-20 LAB — BASIC METABOLIC PANEL WITH GFR
Anion gap: 9 (ref 5–15)
BUN: 28 mg/dL — ABNORMAL HIGH (ref 8–23)
CO2: 28 mmol/L (ref 22–32)
Calcium: 8.3 mg/dL — ABNORMAL LOW (ref 8.9–10.3)
Chloride: 104 mmol/L (ref 98–111)
Creatinine, Ser: 1.7 mg/dL — ABNORMAL HIGH (ref 0.44–1.00)
GFR calc Af Amer: 31 mL/min — ABNORMAL LOW
GFR calc non Af Amer: 27 mL/min — ABNORMAL LOW
Glucose, Bld: 108 mg/dL — ABNORMAL HIGH (ref 70–99)
Potassium: 4.6 mmol/L (ref 3.5–5.1)
Sodium: 141 mmol/L (ref 135–145)

## 2019-11-20 LAB — GLUCOSE, CAPILLARY
Glucose-Capillary: 85 mg/dL (ref 70–99)
Glucose-Capillary: 148 mg/dL — ABNORMAL HIGH (ref 70–99)

## 2019-11-20 LAB — SARS CORONAVIRUS 2 (TAT 6-24 HRS): SARS Coronavirus 2: NEGATIVE

## 2019-11-20 LAB — HEMOGLOBIN A1C
Hgb A1c MFr Bld: 5.7 % — ABNORMAL HIGH (ref 4.8–5.6)
Mean Plasma Glucose: 116.89 mg/dL

## 2019-11-20 MED ORDER — ACETAMINOPHEN 325 MG PO TABS: 650.0000 mg | ORAL_TABLET | Freq: Four times a day (QID) | ORAL | 0 refills | Status: AC | PRN

## 2019-11-20 MED ORDER — POLYETHYLENE GLYCOL 3350 17 G PO PACK: 17.0000 g | PACK | Freq: Every day | ORAL | Status: DC | PRN

## 2019-11-20 MED ORDER — METHOCARBAMOL 500 MG PO TABS: 500.0000 mg | ORAL_TABLET | Freq: Four times a day (QID) | ORAL | 0 refills | Status: DC | PRN

## 2019-11-20 MED ORDER — SODIUM CHLORIDE 0.9 % IV SOLN
1.0000 g | Freq: Every day | INTRAVENOUS | Status: DC
  Administered 2019-11-20: 1 g via INTRAVENOUS
  Filled 2019-11-20: qty 1
  Filled 2019-11-20: qty 10

## 2019-11-20 MED ORDER — CEFPODOXIME PROXETIL 100 MG PO TABS: 100.0000 mg | ORAL_TABLET | Freq: Two times a day (BID) | ORAL | 0 refills | Status: AC

## 2019-11-20 MED ORDER — AMLODIPINE BESYLATE 5 MG PO TABS: 5.0000 mg | ORAL_TABLET | Freq: Every day | ORAL | 0 refills | Status: AC

## 2019-11-20 MED ORDER — SITAGLIPTIN PHOSPHATE 50 MG PO TABS: 50.0000 mg | ORAL_TABLET | Freq: Every day | ORAL | 0 refills | Status: DC

## 2019-11-20 NOTE — Evaluation (Signed)
Occupational Therapy Evaluation Patient Details Name: Alisha [name] MRN: [id number] DOB: [birth year] Today's Date: 11/20/2019    History of Present Illness Pt is an 86 year old woman who fell outside a nail salon resulting in pubic rami fractures. PMH: DM, anemia, arthritis, HTN, osteoporosis, PVD, CAD.   Clinical Impression   Pt was ambulating with a cane, but overall independent in ADL and IADL prior to admission. She lives alone, but has a son who lives locally. Pt presents with mild L LE pain (premedicated) and impaired standing balance. Pt requires set up to max assist for ADL and min assist for OOB activity with RW. Pt to talk to her son today about level of support he may provide at home. Recommending HHOT if son can arrange to stay with pt initially.     Follow Up Recommendations  Home health OT;Supervision/Assistance - 24 hour    Equipment Recommendations  None recommended by OT    Recommendations for Other Services       Precautions / Restrictions Precautions Precautions: Fall Restrictions Weight Bearing Restrictions: No Other Position/Activity Restrictions: WBAT B LEs      Mobility Bed Mobility Overal bed mobility: Needs Assistance Bed Mobility: Supine to Sit     Supine to sit: Min guard     General bed mobility comments: increased time, verbal cues for technique, min guard for L LE  Transfers Overall transfer level: Needs assistance Equipment used: Rolling walker (2 wheeled) Transfers: Sit to/from [facility] Sit to Stand: Min assist;+2 safety/equipment Stand pivot transfers: Min assist;+2 safety/equipment       General transfer comment: cues for hand placement and technique with RW, assist to rise and steady    Balance Overall balance assessment: Needs assistance   Sitting balance-Leahy Scale: Good       Standing balance-Leahy Scale: Poor                             ADL either performed or assessed with clinical  judgement   ADL Overall ADL's : Needs assistance/impaired Eating/Feeding: Independent;Sitting   Grooming: Set up;Sitting   Upper Body Bathing: Set up;Sitting   Lower Body Bathing: Maximal assistance;Sit to/from stand   Upper Body Dressing : Set up;Sitting   Lower Body Dressing: Maximal assistance;Sit to/from stand   Toilet Transfer: Minimal assistance;Stand-pivot;BSC;RW   Toileting- Clothing Manipulation and Hygiene: Maximal assistance;Sit to/from stand       Functional mobility during ADLs: Minimal assistance;Rolling walker;+2 for safety/equipment       Vision Baseline Vision/History: Wears glasses Wears Glasses: At all times Patient Visual Report: No change from baseline       Perception     Praxis      Pertinent Vitals/Pain Pain Assessment: Faces Faces Pain Scale: Hurts a little bit Pain Location: L groin Pain Descriptors / Indicators: Aching Pain Intervention(s): Monitored during session;Premedicated before session;Ice applied     Hand Dominance Right   Extremity/Trunk Assessment Upper Extremity Assessment Upper Extremity Assessment: Overall WFL for tasks assessed   Lower Extremity Assessment Lower Extremity Assessment: Defer to PT evaluation       Communication Communication Communication: No difficulties   Cognition Arousal/Alertness: Awake/alert Behavior During Therapy: WFL for tasks assessed/performed Overall Cognitive Status: Within Functional Limits for tasks assessed  General Comments       Exercises     Shoulder Instructions      Home Living Family/patient expects to be discharged to:: Private residence Living Arrangements: Alone Available Help at Discharge: Family Type of Home: House Home Access: Silo: One level     Bathroom Shower/Tub: Page: Handicapped height     Home Equipment: Environmental consultant - 2 wheels;Shower seat;Grab  bars - toilet;Grab bars - tub/shower;Cane - single point;Bedside commode          Prior Functioning/Environment Level of Independence: Independent with assistive device(s)        Comments: pt walks with a cane, she is otherwise independent in ADL and IADL and drives        OT Problem List: Decreased strength;Impaired balance (sitting and/or standing);Decreased knowledge of use of DME or AE;Pain      OT Treatment/Interventions: Self-care/ADL training;DME and/or AE instruction;Patient/family education;Balance training;Therapeutic activities    OT Goals(Current goals can be found in the care plan section) Acute Rehab OT Goals Patient Stated Goal: to go home OT Goal Formulation: With patient Time For Goal Achievement: 12/04/19 Potential to Achieve Goals: Good ADL Goals Pt Will Perform Grooming: with supervision;standing Pt Will Perform Lower Body Bathing: with supervision;with adaptive equipment;sit to/from stand Pt Will Perform Lower Body Dressing: with supervision;with adaptive equipment;sit to/from stand Pt Will Transfer to Toilet: with supervision;ambulating;bedside commode Pt Will Perform Toileting - Clothing Manipulation and hygiene: with supervision;sit to/from stand  OT Frequency: Min 2X/week   Barriers to D/C:            Co-evaluation PT/OT/SLP Co-Evaluation/Treatment: Yes Reason for Co-Treatment: For patient/therapist safety   OT goals addressed during session: ADL's and self-care;Proper use of Adaptive equipment and DME      AM-PAC OT "6 Clicks" Daily Activity     Outcome Measure Help from another person eating meals?: None Help from another person taking care of personal grooming?: A Little Help from another person toileting, which includes using toliet, bedpan, or urinal?: A Lot Help from another person bathing (including washing, rinsing, drying)?: A Lot Help from another person to put on and taking off regular upper body clothing?: A Little Help from  another person to put on and taking off regular lower body clothing?: A Lot 6 Click Score: 16   End of Session Equipment Utilized During Treatment: Rolling walker Nurse Communication: Mobility status  Activity Tolerance: Patient tolerated treatment well Patient left: in chair;with call bell/phone within reach  OT Visit Diagnosis: Unsteadiness on feet (R26.81);Other abnormalities of gait and mobility (R26.89);Pain;History of falling (Z91.81)                Time: [id number] OT Time Calculation (min): 32 min Charges:  OT General Charges $OT Visit: 1 Visit OT Evaluation $OT Eval Moderate Complexity: 1 Mod  Nestor Lewandowsky, OTR/L Acute Rehabilitation Services Pager: [phone] Office: [phone]  Malka So 11/20/2019, 11:58 AM

## 2019-11-20 NOTE — Consult Note (Signed)
Reason for Consult: Left hip pain Referring Physician: Hospitalist  Alisha [name] is an 86 y.o. female.  HPI: Patient is an 86 year old female whose had a complex left hip fracture in the past.  She fell earlier and was having significant left hip pain.  She is evaluated in the emergency room and noted to have no obvious fracture on plain x-ray.  CT scan showed superior and inferior pubic ramus fractures.  They are not able to identify any fractures in the back of the pelvis.  She was having difficulty ambulating was admitted for pain control and ambulation training.  Past Medical History:  Diagnosis Date  . Anemia    takes Ferrous Sulfate daily  . Arthritis   . B12 deficiency    takes Vit B12 Q3 months  . Blood transfusion   . Colon polyps   . Diabetes mellitus    takes Metformin and Glipizide daily  . Hemorrhoids   . History of blood transfusion    no abnormal reaction noted  . Hyperlipidemia    takes Zetia daily  . Hypertension    takes Diovan and Metoprolol daily  . Joint pain   . Multiple thyroid nodules    takes Synthroid daily  . Osteoporosis    takes Fosamax every Wed  . Peripheral vascular disease ([place])    DVT  2008 with ? anticoagulation  . PONV (postoperative nausea and vomiting)     Past Surgical History:  Procedure Laterality Date  . COLONOSCOPY WITH PROPOFOL N/A 12/06/2014   Procedure: COLONOSCOPY WITH PROPOFOL;  Surgeon: Garlan Fair, MD;  Location: [facility];  Service: Endoscopy;  Laterality: N/A;  . ESOPHAGOGASTRODUODENOSCOPY (EGD) WITH PROPOFOL N/A 12/06/2014   Procedure: ESOPHAGOGASTRODUODENOSCOPY (EGD) WITH PROPOFOL;  Surgeon: Garlan Fair, MD;  Location: [facility];  Service: Endoscopy;  Laterality: N/A;  . FEMUR SURGERY Left 07/08/11   plates and screws  . FRACTURE SURGERY     8/12 femur fx with rod and bolt placed  . HEMORRHOIDECTOMY WITH HEMORRHOID BANDING    . HERNIA REPAIR  1945  . IR ANGIO EXTRACRAN SEL COM CAROTID INNOMINATE UNI  BILAT MOD SED  06/13/2018  . IR ANGIO VERTEBRAL SEL SUBCLAVIAN INNOMINATE UNI R MOD SED  06/13/2018  . IR ANGIO VERTEBRAL SEL VERTEBRAL UNI L MOD SED  06/13/2018  . MASTECTOMY W/ SENTINEL NODE BIOPSY Right 05/13/2014   Procedure: MASTECTOMY WITH SENTINEL LYMPH NODE BIOPSY;  Surgeon: Stark Klein, MD;  Location: [facility];  Service: General;  Laterality: Right;  . THYROIDECTOMY  12/04/2011   Procedure: THYROIDECTOMY;  Surgeon: Earnstine Regal, MD;  Location: [facility];  Service: General;  Laterality: N/A;  total thyroidectomy    Family History  Problem Relation Age of Onset  . Hypertension Mother   . Hypertension Father     Social History:  reports that she has never smoked. She has never used smokeless tobacco. She reports that she does not drink alcohol or use drugs.  Allergies:  Allergies  Allergen Reactions  . Ace Inhibitors Cough  . Codeine Nausea Only  . Evista [Raloxifene Hydrochloride] Other (See Comments)    cramps   . Zocor [Simvastatin - High Dose] Other (See Comments)    Cramps     Medications: I have reviewed the patient's current medications.  Results for orders placed or performed during the hospital encounter of 11/19/19 (from the past 48 hour(s))  CBC with Differential     Status: Abnormal   Collection Time: 11/19/19  7:25  PM  Result Value Ref Range   WBC 12.6 (H) 4.0 - 10.5 K/uL   RBC 3.56 (L) 3.87 - 5.11 MIL/uL   Hemoglobin 10.2 (L) 12.0 - 15.0 g/dL   HCT 33.4 (L) 36.0 - 46.0 %   MCV 93.8 80.0 - 100.0 fL   MCH 28.7 26.0 - 34.0 pg   MCHC 30.5 30.0 - 36.0 g/dL   RDW 13.2 11.5 - 15.5 %   Platelets 239 150 - 400 K/uL   nRBC 0.0 0.0 - 0.2 %   Neutrophils Relative % 83 %   Neutro Abs 10.3 (H) 1.7 - 7.7 K/uL   Lymphocytes Relative 10 %   Lymphs Abs 1.2 0.7 - 4.0 K/uL   Monocytes Relative 6 %   Monocytes Absolute 0.8 0.1 - 1.0 K/uL   Eosinophils Relative 1 %   Eosinophils Absolute 0.2 0.0 - 0.5 K/uL   Basophils Relative 0 %   Basophils Absolute 0.1 0.0 - 0.1 K/uL    Immature Granulocytes 0 %   Abs Immature Granulocytes 0.05 0.00 - 0.07 K/uL    Comment: Performed at [facility], [place] [address] [place], [place] [id number]  Basic metabolic panel     Status: Abnormal   Collection Time: 11/19/19  7:25 PM  Result Value Ref Range   Sodium 142 135 - 145 mmol/L   Potassium 4.6 3.5 - 5.1 mmol/L   Chloride 103 98 - 111 mmol/L   CO2 28 22 - 32 mmol/L   Glucose, Bld 107 (H) 70 - 99 mg/dL   BUN 30 (H) 8 - 23 mg/dL   Creatinine, Ser 1.81 (H) 0.44 - 1.00 mg/dL   Calcium 8.7 (L) 8.9 - 10.3 mg/dL   GFR calc non Af Amer 25 (L) >60 mL/min   GFR calc Af Amer 29 (L) >60 mL/min   Anion gap 11 5 - 15    Comment: Performed at [facility] [place] [address] [place], [place] [address]  SARS CORONAVIRUS 2 (TAT 6-24 HRS) Nasopharyngeal Nasopharyngeal Swab     Status: None   Collection Time: 11/19/19  7:55 PM   Specimen: Nasopharyngeal Swab  Result Value Ref Range   SARS Coronavirus 2 NEGATIVE NEGATIVE    Comment: (NOTE) SARS-CoV-2 target nucleic acids are NOT DETECTED. The SARS-CoV-2 RNA is generally detectable in upper and lower respiratory specimens during the acute phase of infection. Negative results do not preclude SARS-CoV-2 infection, do not rule out co-infections with other pathogens, and should not be used as the sole basis for treatment or other patient management decisions. Negative results must be combined with clinical observations, patient history, and epidemiological information. The expected result is Negative. Fact Sheet for Patients: SugarRoll.be Fact Sheet for Healthcare Providers: https://www.woods-mathews.com/ This test is not yet approved or cleared by the [place] FDA and  has been authorized for detection and/or diagnosis of SARS-CoV-2 by FDA under an Emergency Use Authorization (EUA). This EUA will remain  in effect (meaning this test can be used) for the  duration of the COVID-19 declaration under Section 56 4(b)(1) of the Act, 21 U.S.C. section 360bbb-3(b)(1), unless the authorization is terminated or revoked sooner. Performed at [facility], [place] [address] [place], [place] [address]   Urinalysis, Routine w reflex microscopic     Status: Abnormal   Collection Time: 11/19/19  8:43 PM  Result Value Ref Range   Color, Urine YELLOW YELLOW   APPearance CLEAR CLEAR   Specific Gravity, Urine 1.016 1.005 -  1.030   pH 7.0 5.0 - 8.0   Glucose, UA NEGATIVE NEGATIVE mg/dL   Hgb urine dipstick NEGATIVE NEGATIVE   Bilirubin Urine NEGATIVE NEGATIVE   Ketones, ur NEGATIVE NEGATIVE mg/dL   Protein, ur 30 (A) NEGATIVE mg/dL   Nitrite POSITIVE (A) NEGATIVE   Leukocytes,Ua SMALL (A) NEGATIVE   RBC / HPF 0-5 0 - 5 RBC/hpf   WBC, UA 6-10 0 - 5 WBC/hpf   Bacteria, UA MANY (A) NONE SEEN    Comment: Performed at [facility], [place] [address], [place], [place] [address]  Glucose, capillary     Status: Abnormal   Collection Time: 11/19/19 10:40 PM  Result Value Ref Range   Glucose-Capillary 109 (H) 70 - 99 mg/dL  Hemoglobin A1c     Status: Abnormal   Collection Time: 11/20/19  4:03 AM  Result Value Ref Range   Hgb A1c MFr Bld 5.7 (H) 4.8 - 5.6 %    Comment: (NOTE) Pre diabetes:          5.7%-6.4% Diabetes:              >6.4% Glycemic control for   <7.0% adults with diabetes    Mean Plasma Glucose 116.89 mg/dL    Comment: Performed at [place] [address] [place], [place] [id number]  Basic metabolic panel     Status: Abnormal   Collection Time: 11/20/19  4:03 AM  Result Value Ref Range   Sodium 141 135 - 145 mmol/L   Potassium 4.6 3.5 - 5.1 mmol/L   Chloride 104 98 - 111 mmol/L   CO2 28 22 - 32 mmol/L   Glucose, Bld 108 (H) 70 - 99 mg/dL   BUN 28 (H) 8 - 23 mg/dL   Creatinine, Ser 1.70 (H) 0.44 - 1.00 mg/dL   Calcium 8.3 (L) 8.9 - 10.3 mg/dL   GFR calc non Af Amer 27 (L) >60 mL/min   GFR calc Af Amer  31 (L) >60 mL/min   Anion gap 9 5 - 15    Comment: Performed at [facility], [place] [address], [place], [place] [address]  CBC     Status: Abnormal   Collection Time: 11/20/19  4:03 AM  Result Value Ref Range   WBC 8.3 4.0 - 10.5 K/uL   RBC 3.42 (L) 3.87 - 5.11 MIL/uL   Hemoglobin 9.7 (L) 12.0 - 15.0 g/dL   HCT 32.4 (L) 36.0 - 46.0 %   MCV 94.7 80.0 - 100.0 fL   MCH 28.4 26.0 - 34.0 pg   MCHC 29.9 (L) 30.0 - 36.0 g/dL   RDW 13.2 11.5 - 15.5 %   Platelets 216 150 - 400 K/uL   nRBC 0.0 0.0 - 0.2 %    Comment: Performed at [facility], [place] [address], [place], [place] [address]  Glucose, capillary     Status: None   Collection Time: 11/20/19  7:55 AM  Result Value Ref Range   Glucose-Capillary 85 70 - 99 mg/dL  Glucose, capillary     Status: Abnormal   Collection Time: 11/20/19 11:43 AM  Result Value Ref Range   Glucose-Capillary 148 (H) 70 - 99 mg/dL    CT PELVIS WO CONTRAST  Result Date: 11/19/2019 CLINICAL DATA:  Pelvic pain after fall EXAM: CT PELVIS WITHOUT CONTRAST TECHNIQUE: Multidetector CT imaging of the pelvis was performed following the standard protocol without intravenous contrast. COMPARISON:  X-ray 11/19/2019 FINDINGS: Acute minimally displaced fractures of  the left superior and inferior pubic rami (series 6, image 37; series 2, image 113). Fracture does not appear to involve the pubic symphysis. Symphysis is intact without widening. The bilateral SI joints are intact without diastasis. Patient is status post prior ORIF of the proximal left femur with chronic posttraumatic deformity. No acute fracture or dislocation of the left hip. There is thin, sub-threshold perihardware lucency about the cephalomedullary screw within the left femoral head. There is fullness in the left pelvic sidewall suggestive of a small sidewall hematoma. Otherwise, no acute findings within the pelvis. No free fluid within the pelvis. There is left gluteal  muscular atrophy, likely postsurgical. IMPRESSION: 1. Acute minimally displaced fractures of the left superior and inferior pubic rami. 2. Small left pelvic sidewall hematoma. Electronically Signed   By: Davina Poke D.O.   On: 11/19/2019 18:58   DG Hip Unilat W or Wo Pelvis 2-3 Views Left  Result Date: 11/19/2019 CLINICAL DATA:  Left hip pain after fall today. EXAM: DG HIP (WITH OR WITHOUT PELVIS) 2-3V LEFT COMPARISON:  August 03, 2011. FINDINGS: Status post surgical internal fixation of old healed proximal left femoral fracture. No acute fracture or dislocation is noted. Right hip is unremarkable. IMPRESSION: Status post surgical internal fixation of old healed proximal left femoral fracture. No acute abnormality is noted. Electronically Signed   By: Marijo Conception M.D.   On: 11/19/2019 17:25    ROS  ROS: I have reviewed the patient's review of systems thoroughly and there are no positive responses as relates to the HPI. Blood pressure 135/64, pulse 69, temperature 98.5 F (36.9 C), temperature source Oral, resp. rate 16, SpO2 100 %. Physical Exam Well-developed well-nourished patient in no acute distress. Alert and oriented x3 HEENT:within normal limits Cardiac: Regular rate and rhythm Pulmonary: Lungs clear to auscultation Abdomen: Soft and nontender.  Normal active bowel sounds  Musculoskeletal: Left hip: Painful range of motion.  Limited range of motion.  Negative heel strike.  Minimal tenderness to palpation of the lateral aspect of the heel.  Market tender to palpation in the groin area. Assessment/Plan: 86 year old female with superior and inferior pubic ramus fractures who has been admitted for pain control and ambulation training.//Her fractures will need no surgical intervention.  Her care would be weightbearing as tolerated with a walker.  Ultimately I will see her back in the office about 2 weeks after discharge.  I happy to see her anytime on admission should she need [place]  but otherwise we will sign off at this point.  Alta Corning 11/20/2019, 4:18 PM

## 2019-11-20 NOTE — Progress Notes (Signed)
Physical Therapy Treatment Patient Details Name: Alisha [name] MRN: [id number] DOB: 1931/12/22 Today's Date: 11/20/2019    History of Present Illness Pt is an 86 year old woman who fell outside a nail salon resulting in pubic rami fractures. PMH: DM, anemia, arthritis, HTN, osteoporosis, PVD, CAD.    PT Comments    Pt very motivated and with noted improvement in quality of movement and with decreased level of assist required this pm.  Pt hopeful to dc home rather than follow up at SNF level.   Follow Up Recommendations  Home health PT     Equipment Recommendations  None recommended by PT    Recommendations for Other Services       Precautions / Restrictions Precautions Precautions: Fall Restrictions Weight Bearing Restrictions: No Other Position/Activity Restrictions: WBAT B LEs    Mobility  Bed Mobility Overal bed mobility: Needs Assistance Bed Mobility: Supine to Sit     Supine to sit: Min guard     General bed mobility comments: Pt up in chair and requests back to same  Transfers Overall transfer level: Needs assistance Equipment used: Rolling walker (2 wheeled) Transfers: Sit to/from Stand Sit to Stand: Min assist Stand pivot transfers: Min assist;+2 safety/equipment       General transfer comment: cues for hand placement and technique with RW, assist to rise and steady  Ambulation/Gait Ambulation/Gait assistance: Min assist;Min guard Gait Distance (Feet): 60 Feet Assistive device: Rolling walker (2 wheeled) Gait Pattern/deviations: Step-to pattern;Step-through pattern;Decreased step length - right;Decreased step length - left;Shuffle;Trunk flexed Gait velocity: decr   General Gait Details: cues for sequence, posture and position from [employer]             Wheelchair Mobility    Modified Rankin (Stroke Patients Only)       Balance Overall balance assessment: Needs assistance Sitting-balance support: No upper extremity  supported Sitting balance-Leahy Scale: Good     Standing balance support: Bilateral upper extremity supported Standing balance-Leahy Scale: Poor                              Cognition Arousal/Alertness: Awake/alert Behavior During Therapy: WFL for tasks assessed/performed Overall Cognitive Status: Within Functional Limits for tasks assessed                                        Exercises      General Comments        Pertinent Vitals/Pain Pain Assessment: 0-10 Pain Score: 5 (with activity, min pain at rest) Faces Pain Scale: Hurts a little bit Pain Location: L groin Pain Descriptors / Indicators: Aching Pain Intervention(s): Limited activity within patient's tolerance;Monitored during session;Premedicated before session    Home Living Family/patient expects to be discharged to:: Private residence Living Arrangements: Alone Available Help at Discharge: Family Type of Home: House Home Access: Ramped entrance   Home Layout: One level Home Equipment: Environmental consultant - 2 wheels;Shower seat;Grab bars - toilet;Grab bars - tub/shower;Cane - single point;Bedside commode      Prior Function Level of Independence: Independent with assistive device(s)      Comments: pt walks with a cane, she is otherwise independent in ADL and IADL and drives   PT Goals (current goals can now be found in the care plan section) Acute Rehab PT Goals Patient Stated Goal: to go home PT  Goal Formulation: With patient Time For Goal Achievement: 12/04/19 Potential to Achieve Goals: Good Progress towards PT goals: Progressing toward goals    Frequency    Min 5X/week      PT Plan Current plan remains appropriate    Co-evaluation PT/OT/SLP Co-Evaluation/Treatment: Yes Reason for Co-Treatment: For patient/therapist safety PT goals addressed during session: Mobility/safety with mobility OT goals addressed during session: ADL's and self-care      AM-PAC PT "6 Clicks"  Mobility   Outcome Measure  Help needed turning from your back to your side while in a flat bed without using bedrails?: A Little Help needed moving from lying on your back to sitting on the side of a flat bed without using bedrails?: A Little Help needed moving to and from a bed to a chair (including a wheelchair)?: A Little Help needed standing up from a chair using your arms (e.g., wheelchair or bedside chair)?: A Little Help needed to walk in hospital room?: A Little Help needed climbing 3-5 steps with a railing? : A Lot 6 Click Score: 17    End of Session Equipment Utilized During Treatment: Gait belt Activity Tolerance: Patient tolerated treatment well Patient left: in chair;with call bell/phone within reach Nurse Communication: Mobility status PT Visit Diagnosis: Unsteadiness on feet (R26.81);Difficulty in walking, not elsewhere classified (R26.2)     Time: [id number] PT Time Calculation (min) (ACUTE ONLY): 26 min  Charges:  $Gait Training: 8-22 mins                     [place] Pager [phone] Office [phone]    Aideen Fenster 11/20/2019, 1:54 PM

## 2019-11-20 NOTE — TOC Progression Note (Signed)
Transition of Care [facility]) - Progression Note    Patient Details  Name: Alisha [name] MRN: [id number] Date of Birth: October 26, 1932  [facility] CM/SW Contact  Purcell Mouton, RN Phone Number: 11/20/2019, 3:28 PM  Clinical Narrative:     Pt selected [facility] for HHPT/OT. Referral given to in house rep.        Expected Discharge Plan and Services                                                 Social Determinants of Health (SDOH) Interventions    Readmission Risk Interventions No flowsheet data found.

## 2019-11-20 NOTE — Progress Notes (Signed)
Patient ID: Alisha [name], female   DOB: 10/02/1932, 86 y.o.   MRN: [id number]  I have reviewed her plain x-rays and CT scan on this patient.  There is no reason to restrict her weightbearing.   She likely will need a walker and/or some additional assistance for weightbearing.I will see her later today for a full consult.   She may begin physical therapy prior to that consultation.  Her status will be weightbearing as tolerated.

## 2019-11-20 NOTE — Progress Notes (Signed)
Occupational Therapy Treatment Patient Details Name: Alisha [name] MRN: [id number] DOB: [birth year] Today's Date: 11/20/2019    History of present illness Pt is an 86 year old woman who fell outside a nail salon resulting in pubic rami fractures. PMH: DM, anemia, arthritis, HTN, osteoporosis, PVD, CAD.   OT comments  Pt's son to stay with her when she returns home. Pt demonstrated ability to perform toileting with min assist and standing grooming with min guard assist at sink. She has AE at home for LB ADL. Plan is for home later today.  Follow Up Recommendations  Home health OT;Supervision/Assistance - 24 hour    Equipment Recommendations  None recommended by OT    Recommendations for Other Services      Precautions / Restrictions Precautions Precautions: Fall Restrictions Weight Bearing Restrictions: No Other Position/Activity Restrictions: WBAT B LEs       Mobility Bed Mobility               General bed mobility comments: in chair  Transfers Overall transfer level: Needs assistance Equipment used: Rolling walker (2 wheeled) Transfers: Sit to/from Stand Sit to Stand: Min guard         General transfer comment: min guard for safety, good technique    Balance Overall balance assessment: Needs assistance Sitting-balance support: No upper extremity supported Sitting balance-Leahy Scale: Good     Standing balance support: No upper extremity supported Standing balance-Leahy Scale: Fair Standing balance comment: able to release walker in static standing to manage underwear and to wash hands                           ADL either performed or assessed with clinical judgement   ADL Overall ADL's : Needs assistance/impaired     Grooming: Wash/dry hands;Brushing hair;Standing;Min guard                   Toilet Transfer: Min guard;Ambulation;RW   Toileting- Clothing Manipulation and Hygiene: Minimal assistance;Sit to/from stand Toileting -  Clothing Manipulation Details (indicate cue type and reason): assist for clothing management     Functional mobility during ADLs: Min guard;Rolling walker General ADL Comments: Pt has a AE for LB bathing and dressing at home and is knowledgeable in use.     Vision       Perception     Praxis      Cognition Arousal/Alertness: Awake/alert Behavior During Therapy: WFL for tasks assessed/performed Overall Cognitive Status: Within Functional Limits for tasks assessed                                          Exercises     Shoulder Instructions       General Comments      Pertinent Vitals/ Pain       Pain Assessment: Faces Pain Score: 5 (with activity, min pain at rest) Faces Pain Scale: Hurts a little bit Pain Location: L groin Pain Descriptors / Indicators: Sore Pain Intervention(s): Monitored during session;Premedicated before session  Home Living                                          Prior Functioning/Environment              Frequency  Min 2X/week        Progress Toward Goals  OT Goals(current goals can now be found in the care plan section)  Progress towards OT goals: Progressing toward goals  Acute Rehab OT Goals Patient Stated Goal: to go home OT Goal Formulation: With patient Time For Goal Achievement: 12/04/19 Potential to Achieve Goals: Good ADL Goals Pt Will Perform Grooming: with supervision;standing Pt Will Perform Lower Body Bathing: with supervision;with adaptive equipment;sit to/from stand Pt Will Perform Lower Body Dressing: with supervision;with adaptive equipment;sit to/from stand Pt Will Transfer to Toilet: with supervision;ambulating;bedside commode Pt Will Perform Toileting - Clothing Manipulation and hygiene: with supervision;sit to/from stand  Plan Discharge plan remains appropriate    Co-evaluation                 AM-PAC OT "6 Clicks" Daily Activity     Outcome Measure   Help  from another person eating meals?: None Help from another person taking care of personal grooming?: A Little Help from another person toileting, which includes using toliet, bedpan, or urinal?: A Little Help from another person bathing (including washing, rinsing, drying)?: A Lot Help from another person to put on and taking off regular upper body clothing?: A Little Help from another person to put on and taking off regular lower body clothing?: A Lot 6 Click Score: 17    End of Session Equipment Utilized During Treatment: Rolling walker  OT Visit Diagnosis: Unsteadiness on feet (R26.81);Other abnormalities of gait and mobility (R26.89);Pain;History of falling (Z91.81)   Activity Tolerance Patient tolerated treatment well   Patient Left in chair;with call bell/phone within reach   Nurse Communication          Time: [id number] OT Time Calculation (min): 19 min  Charges: OT General Charges $OT Visit: 1 Visit OT Treatments $Self Care/Home Management : 8-22 mins  Nestor Lewandowsky, OTR/L Acute Rehabilitation Services Pager: [phone] Office: [phone]   Malka So 11/20/2019, 3:57 PM

## 2019-11-20 NOTE — Progress Notes (Signed)
Home with son. Discharge teaching done. Written info given.  RX given

## 2019-11-20 NOTE — Evaluation (Signed)
Physical Therapy Evaluation Patient Details Name: Alisha [name] MRN: [id number] DOB: May 13, 1932 Today's Date: 11/20/2019   History of Present Illness  Pt is an 86 year old woman who fell outside a nail salon resulting in pubic rami fractures. PMH: DM, anemia, arthritis, HTN, osteoporosis, PVD, CAD.  Clinical Impression  Pt admitted as above and presenting with decreased L LE strength and pain with activity limiting functional mobility.  Pt hopes to progress to dc home with intermittent assist of family.    Follow Up Recommendations Home health PT    Equipment Recommendations  None recommended by PT    Recommendations for Other Services       Precautions / Restrictions Precautions Precautions: Fall Restrictions Weight Bearing Restrictions: No Other Position/Activity Restrictions: WBAT B LEs      Mobility  Bed Mobility Overal bed mobility: Needs Assistance Bed Mobility: Supine to Sit     Supine to sit: Min guard     General bed mobility comments: increased time, verbal cues for technique, min guard for L LE  Transfers Overall transfer level: Needs assistance Equipment used: Rolling walker (2 wheeled) Transfers: Sit to/from [facility] Sit to Stand: Min assist;+2 safety/equipment Stand pivot transfers: Min assist;+2 safety/equipment       General transfer comment: cues for hand placement and technique with RW, assist to rise and steady  Ambulation/Gait Ambulation/Gait assistance: Min assist Gait Distance (Feet): 36 Feet Assistive device: Rolling walker (2 wheeled) Gait Pattern/deviations: Step-to pattern;Step-through pattern;Decreased step length - right;Decreased step length - left;Shuffle;Trunk flexed Gait velocity: decr   General Gait Details: cues for sequence, posture and position from [employer]            Wheelchair Mobility    Modified Rankin (Stroke Patients Only)       Balance Overall balance assessment: Needs assistance    Sitting balance-Leahy Scale: Good     Standing balance support: Bilateral upper extremity supported Standing balance-Leahy Scale: Poor                               Pertinent Vitals/Pain Pain Assessment: 0-10 Pain Score: 5 (with activity, min pain at rest) Faces Pain Scale: Hurts a little bit Pain Location: L groin Pain Descriptors / Indicators: Aching Pain Intervention(s): Limited activity within patient's tolerance;Monitored during session;Premedicated before session    Home Living Family/patient expects to be discharged to:: Private residence Living Arrangements: Alone Available Help at Discharge: Family Type of Home: House Home Access: Ramped entrance     Home Layout: One level Home Equipment: Environmental consultant - 2 wheels;Shower seat;Grab bars - toilet;Grab bars - tub/shower;Cane - single point;Bedside commode      Prior Function Level of Independence: Independent with assistive device(s)         Comments: pt walks with a cane, she is otherwise independent in ADL and IADL and drives     Hand Dominance   Dominant Hand: Right    Extremity/Trunk Assessment   Upper Extremity Assessment Upper Extremity Assessment: Overall WFL for tasks assessed    Lower Extremity Assessment Lower Extremity Assessment: LLE deficits/detail LLE Deficits / Details: AROM WFL; strength at hip 2+/5       Communication   Communication: No difficulties  Cognition Arousal/Alertness: Awake/alert Behavior During Therapy: WFL for tasks assessed/performed Overall Cognitive Status: Within Functional Limits for tasks assessed  General Comments      Exercises     Assessment/Plan    PT Assessment Patient needs continued PT services  PT Problem List Decreased strength;Decreased range of motion;Decreased activity tolerance;Decreased mobility;Decreased balance;Pain;Decreased knowledge of use of DME       PT Treatment  Interventions DME instruction;Gait training;Functional mobility training;Therapeutic activities;Therapeutic exercise;Balance training;Patient/family education    PT Goals (Current goals can be found in the Care Plan section)  Acute Rehab PT Goals Patient Stated Goal: to go home PT Goal Formulation: With patient Time For Goal Achievement: 12/04/19 Potential to Achieve Goals: Good    Frequency Min 5X/week   Barriers to discharge Decreased caregiver support Lives alone, pt will discuss with son    Co-evaluation PT/OT/SLP Co-Evaluation/Treatment: Yes Reason for Co-Treatment: For patient/therapist safety PT goals addressed during session: Mobility/safety with mobility OT goals addressed during session: ADL's and self-care       AM-PAC PT "6 Clicks" Mobility  Outcome Measure Help needed turning from your back to your side while in a flat bed without using bedrails?: A Little Help needed moving from lying on your back to sitting on the side of a flat bed without using bedrails?: A Little Help needed moving to and from a bed to a chair (including a wheelchair)?: A Little Help needed standing up from a chair using your arms (e.g., wheelchair or bedside chair)?: A Little Help needed to walk in hospital room?: A Little Help needed climbing 3-5 steps with a railing? : A Lot 6 Click Score: 17    End of Session Equipment Utilized During Treatment: Gait belt Activity Tolerance: Patient tolerated treatment well Patient left: in chair;with call bell/phone within reach Nurse Communication: Mobility status PT Visit Diagnosis: Unsteadiness on feet (R26.81);Difficulty in walking, not elsewhere classified (R26.2)    Time: [id number] PT Time Calculation (min) (ACUTE ONLY): 30 min   Charges:   PT Evaluation $PT Eval Low Complexity: 1 Low          [place] Pager [phone] Office [phone]   Alba Kriesel 11/20/2019, 1:49 PM

## 2019-11-20 NOTE — Discharge Summary (Signed)
Physician Discharge Summary  Alisha [name] [id number] DOB: 08/13/1932 DOA: 11/19/2019  PCP: Kelton Pillar, MD  Admit date: 11/19/2019 Discharge date: 11/20/2019  Admitted From: Home Disposition:  Home  Recommendations for Outpatient Follow-up:  1. Follow up with PCP in 1-2 weeks 2. Recommend repeat renal panel in 1-2 weeks  Home Health:PT, OT   Discharge Condition:Stable CODE STATUS:Full Diet recommendation: Regular   Brief/Interim Summary: 86 y.o. female with medical history significant of anemia, arthritis, B12 deficiency, non-insulin-dependent type 2 diabetes, hyperlipidemia, hypertension, hypothyroidism, PVD presenting to the ED via EMS for evaluation of left hip/pelvic pain after a fall.  Patient states she was walking back from the nail salon and while stepping off the curb somehow missed a step and fell on her left hip.  Since then she is having pain in this area.  Denies any head injury from the fall.  Denies headaches, neck pain, or back pain.  Denies loss of consciousness.  Patient states she was in her usual state of health and has not been ill recently.  She was not having any lightheadedness/dizziness, chest pain, shortness of breath, or palpitations at the time of the fall.  No cough, nausea, vomiting, abdominal pain, or diarrhea.  Triage note mentions left elbow pain but patient denies any pain in this area and states she has no problem moving her elbow joint.  ED Course: Afebrile.  WBC count 12.6.  Hemoglobin 10.2, no significant change from baseline.  Blood glucose 107.  BUN 30, creatinine 1.8.  Creatinine was ranging between 1.9-1.4 in July 2019.  SARS-CoV-2 PCR test pending.  CT showing acute minimally displaced fractures of the left superior and inferior pubic rami.  Small left pelvic sidewall hematoma.  Discharge Diagnoses:  Principal Problem:   Pubic ramus fracture ([place]) Active Problems:   Hypertension   Fall at home, initial encounter   AKI (acute kidney  injury) ([place])   Leukocytosis    Pubic rami fractures secondary to a mechanical fall CT showing acute minimally displaced fractures of the left superior and inferior pubic rami.  Small left pelvic sidewall hematoma. -Pain management: good control with robaxin and tylenol -PT and OT evaluation. Noted to do well with recs for home health PT/OT -Bowel regimen -Seen by Orthopedic Surgery, recommendation for wt bearing as tolerated  AKI on CKD stage III BUN 30, creatinine 1.8.  Creatinine was ranging between 1.9-1.4 in July 2019.  Does take losartan at home. -Unclear what baseline renal function is prior to admit -Given gentle IV fluid hydration with some improvement in renal function -Pt voiding well -see below. Will hold home metformin and ARB -Recommend repeat BMET in 1 week  Mild leukocytosis with UTI without sepsis -Presenting WBC count 12.6.   -UA suggestive of UTI, was started on rocephin. Will complete course of vantin on d/c -WBC normalized  Non-insulin-dependent diabetes mellitus -A1c of 5.7 suggesting good glycemic control -Given GFR<30, will hold metformin and transition to [place] on d/c  Hypertension -Continue home Coreg, Imdur -Pt noted to be on ARB prior to admit, will hold and start norvasc  Hyperlipidemia -Continue home Zetia  Hypothyroidism -Continue Synthroid   Discharge Instructions   Allergies as of 11/20/2019      Reactions   Ace Inhibitors Cough   Codeine Nausea Only   Evista [raloxifene Hydrochloride] Other (See Comments)   cramps   Zocor [simvastatin - High Dose] Other (See Comments)   Cramps       Medication List    STOP taking  these medications   losartan 100 MG tablet Commonly known as: COZAAR   metFORMIN 850 MG tablet Commonly known as: GLUCOPHAGE     TAKE these medications   acetaminophen 325 MG tablet Commonly known as: TYLENOL Take 2 tablets (650 mg total) by mouth every 6 (six) hours as needed for mild pain or moderate  pain.   amLODipine 5 MG tablet Commonly known as: NORVASC Take 1 tablet (5 mg total) by mouth daily.   aspirin EC 81 MG tablet Take 1 tablet (81 mg total) by mouth daily.   carvedilol 25 MG tablet Commonly known as: COREG Take 1 tablet (25 mg total) by mouth 2 (two) times daily with a meal.   cefpodoxime 100 MG tablet Commonly known as: VANTIN Take 1 tablet (100 mg total) by mouth 2 (two) times daily for 3 days.   cyanocobalamin 1000 MCG/ML injection Commonly known as: (VITAMIN B-12) Inject 1,000 mcg into the muscle every 3 (three) months.   ferrous sulfate 325 (65 FE) MG tablet Take 1 tablet (325 mg total) by mouth daily with breakfast.   isosorbide mononitrate 30 MG 24 hr tablet Commonly known as: IMDUR Take 1 tablet (30 mg total) by mouth daily.   levothyroxine 100 MCG tablet Commonly known as: SYNTHROID Take 100 mcg by mouth every morning.   methocarbamol 500 MG tablet Commonly known as: ROBAXIN Take 1 tablet (500 mg total) by mouth every 6 (six) hours as needed for muscle spasms.   multivitamin with minerals Tabs tablet Take 1 tablet by mouth every morning.   senna-docusate 8.6-50 MG tablet Commonly known as: Senokot-S Take 1 tablet by mouth at bedtime. What changed:   when to take this  reasons to take this   sitaGLIPtin 50 MG tablet Commonly known as: Januvia Take 1 tablet (50 mg total) by mouth daily.   UNABLE TO FIND 174.9 Right Mastectomy   L8000- Post Surgical Bras-6  [id number]- Silicone Breast Prosthesis-1   Zetia 10 MG tablet Generic drug: ezetimibe Take 10 mg by mouth daily before breakfast.      Follow-up Information    Kelton Pillar, MD. Schedule an appointment as soon as possible for a visit in 2 week(s).   Specialty: Family Medicine Contact information: [address] [place] [address] [phone]          Allergies  Allergen Reactions  . Ace Inhibitors Cough  . Codeine Nausea Only  . Evista  [Raloxifene Hydrochloride] Other (See Comments)    cramps   . Zocor [Simvastatin - High Dose] Other (See Comments)    Cramps     Consultations:  Orthopedic Surgery  Procedures/Studies: CT PELVIS WO CONTRAST  Result Date: 11/19/2019 CLINICAL DATA:  Pelvic pain after fall EXAM: CT PELVIS WITHOUT CONTRAST TECHNIQUE: Multidetector CT imaging of the pelvis was performed following the standard protocol without intravenous contrast. COMPARISON:  X-ray 11/19/2019 FINDINGS: Acute minimally displaced fractures of the left superior and inferior pubic rami (series 6, image 37; series 2, image 113). Fracture does not appear to involve the pubic symphysis. Symphysis is intact without widening. The bilateral SI joints are intact without diastasis. Patient is status post prior ORIF of the proximal left femur with chronic posttraumatic deformity. No acute fracture or dislocation of the left hip. There is thin, sub-threshold perihardware lucency about the cephalomedullary screw within the left femoral head. There is fullness in the left pelvic sidewall suggestive of a small sidewall hematoma. Otherwise, no acute findings within the pelvis. No free  fluid within the pelvis. There is left gluteal muscular atrophy, likely postsurgical. IMPRESSION: 1. Acute minimally displaced fractures of the left superior and inferior pubic rami. 2. Small left pelvic sidewall hematoma. Electronically Signed   By: Davina Poke D.O.   On: 11/19/2019 18:58   DG Hip Unilat W or Wo Pelvis 2-3 Views Left  Result Date: 11/19/2019 CLINICAL DATA:  Left hip pain after fall today. EXAM: DG HIP (WITH OR WITHOUT PELVIS) 2-3V LEFT COMPARISON:  August 03, 2011. FINDINGS: Status post surgical internal fixation of old healed proximal left femoral fracture. No acute fracture or dislocation is noted. Right hip is unremarkable. IMPRESSION: Status post surgical internal fixation of old healed proximal left femoral fracture. No acute abnormality is  noted. Electronically Signed   By: Marijo Conception M.D.   On: 11/19/2019 17:25     Subjective: Without complaints  Discharge Exam: Vitals:   11/20/19 0500 11/20/19 1436  BP: (!) 158/86 135/64  Pulse: 78 69  Resp: 18 16  Temp: 98.3 F (36.8 C) 98.5 F (36.9 C)  SpO2: 98% 100%   Vitals:   11/19/19 2100 11/19/19 2222 11/20/19 0500 11/20/19 1436  BP: (!) 159/92 (!) 166/92 (!) 158/86 135/64  Pulse: 78 73 78 69  Resp: 15 18 18 16   Temp:  98 F (36.7 C) 98.3 F (36.8 C) 98.5 F (36.9 C)  TempSrc:  Oral Oral Oral  SpO2: 91% 100% 98% 100%    General: Pt is alert, awake, not in acute distress Cardiovascular: RRR, S1/S2 +, no rubs, no gallops Respiratory: CTA bilaterally, no wheezing, no rhonchi Abdominal: Soft, NT, ND, bowel sounds + Extremities: no edema, no cyanosis   The results of significant diagnostics from this hospitalization (including imaging, microbiology, ancillary and laboratory) are listed below for reference.     Microbiology: Recent Results (from the past 240 hour(s))  SARS CORONAVIRUS 2 (TAT 6-24 HRS) Nasopharyngeal Nasopharyngeal Swab     Status: None   Collection Time: 11/19/19  7:55 PM   Specimen: Nasopharyngeal Swab  Result Value Ref Range Status   SARS Coronavirus 2 NEGATIVE NEGATIVE Final    Comment: (NOTE) SARS-CoV-2 target nucleic acids are NOT DETECTED. The SARS-CoV-2 RNA is generally detectable in upper and lower respiratory specimens during the acute phase of infection. Negative results do not preclude SARS-CoV-2 infection, do not rule out co-infections with other pathogens, and should not be used as the sole basis for treatment or other patient management decisions. Negative results must be combined with clinical observations, patient history, and epidemiological information. The expected result is Negative. Fact Sheet for Patients: SugarRoll.be Fact Sheet for Healthcare  Providers: https://www.woods-mathews.com/ This test is not yet approved or cleared by the [place] FDA and  has been authorized for detection and/or diagnosis of SARS-CoV-2 by FDA under an Emergency Use Authorization (EUA). This EUA will remain  in effect (meaning this test can be used) for the duration of the COVID-19 declaration under Section 56 4(b)(1) of the Act, 21 U.S.C. section 360bbb-3(b)(1), unless the authorization is terminated or revoked sooner. Performed at [facility], [place] [address], [place], [place] [address]      Labs: BNP (last 3 results) No results for input(s): BNP in the last 8760 hours. Basic Metabolic Panel: Recent Labs  Lab 11/19/19 1925 11/20/19 0403  NA 142 141  K 4.6 4.6  CL 103 104  CO2 28 28  GLUCOSE 107* 108*  BUN 30* 28*  CREATININE 1.81* 1.70*  CALCIUM 8.7*  8.3*   Liver Function Tests: No results for input(s): AST, ALT, ALKPHOS, BILITOT, PROT, ALBUMIN in the last 168 hours. No results for input(s): LIPASE, AMYLASE in the last 168 hours. No results for input(s): AMMONIA in the last 168 hours. CBC: Recent Labs  Lab 11/19/19 1925 11/20/19 0403  WBC 12.6* 8.3  NEUTROABS 10.3*  --   HGB 10.2* 9.7*  HCT 33.4* 32.4*  MCV 93.8 94.7  PLT 239 216   Cardiac Enzymes: No results for input(s): CKTOTAL, CKMB, CKMBINDEX, TROPONINI in the last 168 hours. BNP: Invalid input(s): POCBNP CBG: Recent Labs  Lab 11/19/19 2240 11/20/19 0755 11/20/19 1143  GLUCAP 109* 85 148*   D-Dimer No results for input(s): DDIMER in the last 72 hours. Hgb A1c Recent Labs    11/20/19 0403  HGBA1C 5.7*   Lipid Profile No results for input(s): CHOL, HDL, LDLCALC, TRIG, CHOLHDL, LDLDIRECT in the last 72 hours. Thyroid function studies No results for input(s): TSH, T4TOTAL, T3FREE, THYROIDAB in the last 72 hours.  Invalid input(s): FREET3 Anemia work up No results for input(s): VITAMINB12, FOLATE, FERRITIN, TIBC, IRON, RETICCTPCT in  the last 72 hours. Urinalysis    Component Value Date/Time   COLORURINE YELLOW 11/19/2019 2043   APPEARANCEUR CLEAR 11/19/2019 2043   LABSPEC 1.016 11/19/2019 2043   PHURINE 7.0 11/19/2019 2043   GLUCOSEU NEGATIVE 11/19/2019 2043   HGBUR NEGATIVE 11/19/2019 2043   [place] NEGATIVE 11/19/2019 2043   [place] 11/19/2019 2043   PROTEINUR 30 (A) 11/19/2019 2043   UROBILINOGEN 0.2 07/08/2011 1426   NITRITE POSITIVE (A) 11/19/2019 2043   LEUKOCYTESUR SMALL (A) 11/19/2019 2043   Sepsis Labs Invalid input(s): PROCALCITONIN,  WBC,  LACTICIDVEN Microbiology Recent Results (from the past 240 hour(s))  SARS CORONAVIRUS 2 (TAT 6-24 HRS) Nasopharyngeal Nasopharyngeal Swab     Status: None   Collection Time: 11/19/19  7:55 PM   Specimen: Nasopharyngeal Swab  Result Value Ref Range Status   SARS Coronavirus 2 NEGATIVE NEGATIVE Final    Comment: (NOTE) SARS-CoV-2 target nucleic acids are NOT DETECTED. The SARS-CoV-2 RNA is generally detectable in upper and lower respiratory specimens during the acute phase of infection. Negative results do not preclude SARS-CoV-2 infection, do not rule out co-infections with other pathogens, and should not be used as the sole basis for treatment or other patient management decisions. Negative results must be combined with clinical observations, patient history, and epidemiological information. The expected result is Negative. Fact Sheet for Patients: SugarRoll.be Fact Sheet for Healthcare Providers: https://www.woods-mathews.com/ This test is not yet approved or cleared by the [place] FDA and  has been authorized for detection and/or diagnosis of SARS-CoV-2 by FDA under an Emergency Use Authorization (EUA). This EUA will remain  in effect (meaning this test can be used) for the duration of the COVID-19 declaration under Section 56 4(b)(1) of the Act, 21 U.S.C. section 360bbb-3(b)(1), unless the  authorization is terminated or revoked sooner. Performed at [facility], [place] [address], [place], [place] [address]    Time spent: 30 min  SIGNED:   Marylu Lund, MD  [facility] 11/20/2019, 4:13 PM  If 7PM-7AM, please contact night-coverage

## 2019-11-22 DIAGNOSIS — Z7982 Long term (current) use of aspirin: Secondary | ICD-10-CM | POA: Diagnosis not present

## 2019-11-22 DIAGNOSIS — I129 Hypertensive chronic kidney disease with stage 1 through stage 4 chronic kidney disease, or unspecified chronic kidney disease: Secondary | ICD-10-CM | POA: Diagnosis not present

## 2019-11-22 DIAGNOSIS — M1990 Unspecified osteoarthritis, unspecified site: Secondary | ICD-10-CM | POA: Diagnosis not present

## 2019-11-22 DIAGNOSIS — E039 Hypothyroidism, unspecified: Secondary | ICD-10-CM | POA: Diagnosis not present

## 2019-11-22 DIAGNOSIS — E785 Hyperlipidemia, unspecified: Secondary | ICD-10-CM | POA: Diagnosis not present

## 2019-11-22 DIAGNOSIS — Z9181 History of falling: Secondary | ICD-10-CM | POA: Diagnosis not present

## 2019-11-22 DIAGNOSIS — Z7984 Long term (current) use of oral hypoglycemic drugs: Secondary | ICD-10-CM | POA: Diagnosis not present

## 2019-11-22 DIAGNOSIS — N183 Chronic kidney disease, stage 3 unspecified: Secondary | ICD-10-CM | POA: Diagnosis not present

## 2019-11-22 DIAGNOSIS — M800AXD Age-related osteoporosis with current pathological fracture, other site, subsequent encounter for fracture with routine healing: Secondary | ICD-10-CM | POA: Diagnosis not present

## 2019-11-22 DIAGNOSIS — W101XXD Fall (on)(from) sidewalk curb, subsequent encounter: Secondary | ICD-10-CM | POA: Diagnosis not present

## 2019-11-22 DIAGNOSIS — E1151 Type 2 diabetes mellitus with diabetic peripheral angiopathy without gangrene: Secondary | ICD-10-CM | POA: Diagnosis not present

## 2019-11-22 DIAGNOSIS — D631 Anemia in chronic kidney disease: Secondary | ICD-10-CM | POA: Diagnosis not present

## 2019-11-22 DIAGNOSIS — E1122 Type 2 diabetes mellitus with diabetic chronic kidney disease: Secondary | ICD-10-CM | POA: Diagnosis not present

## 2019-11-22 DIAGNOSIS — E538 Deficiency of other specified B group vitamins: Secondary | ICD-10-CM | POA: Diagnosis not present

## 2019-12-03 DIAGNOSIS — D510 Vitamin B12 deficiency anemia due to intrinsic factor deficiency: Secondary | ICD-10-CM | POA: Diagnosis not present

## 2019-12-03 DIAGNOSIS — S32502D Unspecified fracture of left pubis, subsequent encounter for fracture with routine healing: Secondary | ICD-10-CM | POA: Diagnosis not present

## 2019-12-03 DIAGNOSIS — I5030 Unspecified diastolic (congestive) heart failure: Secondary | ICD-10-CM | POA: Diagnosis not present

## 2019-12-03 DIAGNOSIS — W19XXXA Unspecified fall, initial encounter: Secondary | ICD-10-CM | POA: Diagnosis not present

## 2019-12-03 DIAGNOSIS — M810 Age-related osteoporosis without current pathological fracture: Secondary | ICD-10-CM | POA: Diagnosis not present

## 2019-12-03 DIAGNOSIS — N1832 Chronic kidney disease, stage 3b: Secondary | ICD-10-CM | POA: Diagnosis not present

## 2019-12-03 DIAGNOSIS — E1121 Type 2 diabetes mellitus with diabetic nephropathy: Secondary | ICD-10-CM | POA: Diagnosis not present

## 2019-12-22 DIAGNOSIS — E1122 Type 2 diabetes mellitus with diabetic chronic kidney disease: Secondary | ICD-10-CM | POA: Diagnosis not present

## 2019-12-22 DIAGNOSIS — N183 Chronic kidney disease, stage 3 unspecified: Secondary | ICD-10-CM | POA: Diagnosis not present

## 2019-12-22 DIAGNOSIS — Z7984 Long term (current) use of oral hypoglycemic drugs: Secondary | ICD-10-CM | POA: Diagnosis not present

## 2019-12-22 DIAGNOSIS — M1990 Unspecified osteoarthritis, unspecified site: Secondary | ICD-10-CM | POA: Diagnosis not present

## 2019-12-22 DIAGNOSIS — E785 Hyperlipidemia, unspecified: Secondary | ICD-10-CM | POA: Diagnosis not present

## 2019-12-22 DIAGNOSIS — D631 Anemia in chronic kidney disease: Secondary | ICD-10-CM | POA: Diagnosis not present

## 2019-12-22 DIAGNOSIS — E1151 Type 2 diabetes mellitus with diabetic peripheral angiopathy without gangrene: Secondary | ICD-10-CM | POA: Diagnosis not present

## 2019-12-22 DIAGNOSIS — Z9181 History of falling: Secondary | ICD-10-CM | POA: Diagnosis not present

## 2019-12-22 DIAGNOSIS — E039 Hypothyroidism, unspecified: Secondary | ICD-10-CM | POA: Diagnosis not present

## 2019-12-22 DIAGNOSIS — I129 Hypertensive chronic kidney disease with stage 1 through stage 4 chronic kidney disease, or unspecified chronic kidney disease: Secondary | ICD-10-CM | POA: Diagnosis not present

## 2019-12-22 DIAGNOSIS — W101XXD Fall (on)(from) sidewalk curb, subsequent encounter: Secondary | ICD-10-CM | POA: Diagnosis not present

## 2019-12-22 DIAGNOSIS — M800AXD Age-related osteoporosis with current pathological fracture, other site, subsequent encounter for fracture with routine healing: Secondary | ICD-10-CM | POA: Diagnosis not present

## 2019-12-22 DIAGNOSIS — E538 Deficiency of other specified B group vitamins: Secondary | ICD-10-CM | POA: Diagnosis not present

## 2019-12-22 DIAGNOSIS — Z7982 Long term (current) use of aspirin: Secondary | ICD-10-CM | POA: Diagnosis not present

## 2019-12-31 DIAGNOSIS — D649 Anemia, unspecified: Secondary | ICD-10-CM | POA: Diagnosis not present

## 2019-12-31 DIAGNOSIS — N1832 Chronic kidney disease, stage 3b: Secondary | ICD-10-CM | POA: Diagnosis not present

## 2019-12-31 DIAGNOSIS — E1121 Type 2 diabetes mellitus with diabetic nephropathy: Secondary | ICD-10-CM | POA: Diagnosis not present

## 2019-12-31 DIAGNOSIS — E039 Hypothyroidism, unspecified: Secondary | ICD-10-CM | POA: Diagnosis not present

## 2019-12-31 DIAGNOSIS — I129 Hypertensive chronic kidney disease with stage 1 through stage 4 chronic kidney disease, or unspecified chronic kidney disease: Secondary | ICD-10-CM | POA: Diagnosis not present

## 2019-12-31 DIAGNOSIS — D510 Vitamin B12 deficiency anemia due to intrinsic factor deficiency: Secondary | ICD-10-CM | POA: Diagnosis not present

## 2019-12-31 DIAGNOSIS — I5030 Unspecified diastolic (congestive) heart failure: Secondary | ICD-10-CM | POA: Diagnosis not present

## 2020-01-21 DIAGNOSIS — W101XXD Fall (on)(from) sidewalk curb, subsequent encounter: Secondary | ICD-10-CM | POA: Diagnosis not present

## 2020-01-21 DIAGNOSIS — E039 Hypothyroidism, unspecified: Secondary | ICD-10-CM | POA: Diagnosis not present

## 2020-01-21 DIAGNOSIS — I129 Hypertensive chronic kidney disease with stage 1 through stage 4 chronic kidney disease, or unspecified chronic kidney disease: Secondary | ICD-10-CM | POA: Diagnosis not present

## 2020-01-21 DIAGNOSIS — E1122 Type 2 diabetes mellitus with diabetic chronic kidney disease: Secondary | ICD-10-CM | POA: Diagnosis not present

## 2020-01-21 DIAGNOSIS — Z9181 History of falling: Secondary | ICD-10-CM | POA: Diagnosis not present

## 2020-01-21 DIAGNOSIS — Z7982 Long term (current) use of aspirin: Secondary | ICD-10-CM | POA: Diagnosis not present

## 2020-01-21 DIAGNOSIS — D631 Anemia in chronic kidney disease: Secondary | ICD-10-CM | POA: Diagnosis not present

## 2020-01-21 DIAGNOSIS — Z7984 Long term (current) use of oral hypoglycemic drugs: Secondary | ICD-10-CM | POA: Diagnosis not present

## 2020-01-21 DIAGNOSIS — E538 Deficiency of other specified B group vitamins: Secondary | ICD-10-CM | POA: Diagnosis not present

## 2020-01-21 DIAGNOSIS — M800AXD Age-related osteoporosis with current pathological fracture, other site, subsequent encounter for fracture with routine healing: Secondary | ICD-10-CM | POA: Diagnosis not present

## 2020-01-21 DIAGNOSIS — N183 Chronic kidney disease, stage 3 unspecified: Secondary | ICD-10-CM | POA: Diagnosis not present

## 2020-01-21 DIAGNOSIS — E1151 Type 2 diabetes mellitus with diabetic peripheral angiopathy without gangrene: Secondary | ICD-10-CM | POA: Diagnosis not present

## 2020-01-21 DIAGNOSIS — E785 Hyperlipidemia, unspecified: Secondary | ICD-10-CM | POA: Diagnosis not present

## 2020-01-21 DIAGNOSIS — M1990 Unspecified osteoarthritis, unspecified site: Secondary | ICD-10-CM | POA: Diagnosis not present

## 2020-01-25 ENCOUNTER — Other Ambulatory Visit (HOSPITAL_COMMUNITY): Payer: Self-pay | Admitting: *Deleted

## 2020-01-26 ENCOUNTER — Ambulatory Visit (HOSPITAL_COMMUNITY)
Admission: RE | Admit: 2020-01-26 | Discharge: 2020-01-26 | Disposition: A | Discharge status: Home / Self Care | Payer: Medicare Other | Source: Ambulatory Visit | Attending: Gastroenterology | Admitting: Gastroenterology

## 2020-01-26 ENCOUNTER — Other Ambulatory Visit: Payer: Self-pay

## 2020-01-26 DIAGNOSIS — D500 Iron deficiency anemia secondary to blood loss (chronic): Secondary | ICD-10-CM | POA: Diagnosis not present

## 2020-01-26 LAB — CBC
HCT: 29.4 % — ABNORMAL LOW (ref 36.0–46.0)
Hemoglobin: 8.7 g/dL — ABNORMAL LOW (ref 12.0–15.0)
MCH: 26.4 pg (ref 26.0–34.0)
MCHC: 29.6 g/dL — ABNORMAL LOW (ref 30.0–36.0)
MCV: 89.1 fL (ref 80.0–100.0)
Platelets: 286 10*3/uL (ref 150–400)
RBC: 3.3 MIL/uL — ABNORMAL LOW (ref 3.87–5.11)
RDW: 12.8 % (ref 11.5–15.5)
WBC: 6.4 10*3/uL (ref 4.0–10.5)
nRBC: 0 % (ref 0.0–0.2)

## 2020-01-26 LAB — IRON AND TIBC
Iron: 31 ug/dL (ref 28–170)
Saturation Ratios: 9 % — ABNORMAL LOW (ref 10.4–31.8)
TIBC: 358 ug/dL (ref 250–450)
UIBC: 327 ug/dL

## 2020-01-26 LAB — FERRITIN: Ferritin: 8 ng/mL — ABNORMAL LOW (ref 11–307)

## 2020-01-26 MED ORDER — SODIUM CHLORIDE 0.9 % IV SOLN
510.0000 mg | Freq: Once | INTRAVENOUS | Status: AC
  Administered 2020-01-26: 510 mg via INTRAVENOUS
  Filled 2020-01-26: qty 510

## 2020-01-28 DIAGNOSIS — M800AXD Age-related osteoporosis with current pathological fracture, other site, subsequent encounter for fracture with routine healing: Secondary | ICD-10-CM | POA: Diagnosis not present

## 2020-01-28 DIAGNOSIS — N183 Chronic kidney disease, stage 3 unspecified: Secondary | ICD-10-CM | POA: Diagnosis not present

## 2020-01-28 DIAGNOSIS — E1151 Type 2 diabetes mellitus with diabetic peripheral angiopathy without gangrene: Secondary | ICD-10-CM | POA: Diagnosis not present

## 2020-01-28 DIAGNOSIS — E1122 Type 2 diabetes mellitus with diabetic chronic kidney disease: Secondary | ICD-10-CM | POA: Diagnosis not present

## 2020-01-28 DIAGNOSIS — D631 Anemia in chronic kidney disease: Secondary | ICD-10-CM | POA: Diagnosis not present

## 2020-01-28 DIAGNOSIS — I129 Hypertensive chronic kidney disease with stage 1 through stage 4 chronic kidney disease, or unspecified chronic kidney disease: Secondary | ICD-10-CM | POA: Diagnosis not present

## 2020-01-29 DIAGNOSIS — I129 Hypertensive chronic kidney disease with stage 1 through stage 4 chronic kidney disease, or unspecified chronic kidney disease: Secondary | ICD-10-CM | POA: Diagnosis not present

## 2020-01-29 DIAGNOSIS — E1122 Type 2 diabetes mellitus with diabetic chronic kidney disease: Secondary | ICD-10-CM | POA: Diagnosis not present

## 2020-01-29 DIAGNOSIS — D631 Anemia in chronic kidney disease: Secondary | ICD-10-CM | POA: Diagnosis not present

## 2020-01-29 DIAGNOSIS — M800AXD Age-related osteoporosis with current pathological fracture, other site, subsequent encounter for fracture with routine healing: Secondary | ICD-10-CM | POA: Diagnosis not present

## 2020-01-29 DIAGNOSIS — E1151 Type 2 diabetes mellitus with diabetic peripheral angiopathy without gangrene: Secondary | ICD-10-CM | POA: Diagnosis not present

## 2020-01-29 DIAGNOSIS — N183 Chronic kidney disease, stage 3 unspecified: Secondary | ICD-10-CM | POA: Diagnosis not present

## 2020-02-02 DIAGNOSIS — N183 Chronic kidney disease, stage 3 unspecified: Secondary | ICD-10-CM | POA: Diagnosis not present

## 2020-02-03 DIAGNOSIS — I129 Hypertensive chronic kidney disease with stage 1 through stage 4 chronic kidney disease, or unspecified chronic kidney disease: Secondary | ICD-10-CM | POA: Diagnosis not present

## 2020-02-03 DIAGNOSIS — M800AXD Age-related osteoporosis with current pathological fracture, other site, subsequent encounter for fracture with routine healing: Secondary | ICD-10-CM | POA: Diagnosis not present

## 2020-02-03 DIAGNOSIS — N183 Chronic kidney disease, stage 3 unspecified: Secondary | ICD-10-CM | POA: Diagnosis not present

## 2020-02-03 DIAGNOSIS — E1151 Type 2 diabetes mellitus with diabetic peripheral angiopathy without gangrene: Secondary | ICD-10-CM | POA: Diagnosis not present

## 2020-02-03 DIAGNOSIS — D631 Anemia in chronic kidney disease: Secondary | ICD-10-CM | POA: Diagnosis not present

## 2020-02-03 DIAGNOSIS — E1122 Type 2 diabetes mellitus with diabetic chronic kidney disease: Secondary | ICD-10-CM | POA: Diagnosis not present

## 2020-02-05 DIAGNOSIS — E1122 Type 2 diabetes mellitus with diabetic chronic kidney disease: Secondary | ICD-10-CM | POA: Diagnosis not present

## 2020-02-05 DIAGNOSIS — D631 Anemia in chronic kidney disease: Secondary | ICD-10-CM | POA: Diagnosis not present

## 2020-02-05 DIAGNOSIS — E1151 Type 2 diabetes mellitus with diabetic peripheral angiopathy without gangrene: Secondary | ICD-10-CM | POA: Diagnosis not present

## 2020-02-05 DIAGNOSIS — M800AXD Age-related osteoporosis with current pathological fracture, other site, subsequent encounter for fracture with routine healing: Secondary | ICD-10-CM | POA: Diagnosis not present

## 2020-02-05 DIAGNOSIS — I129 Hypertensive chronic kidney disease with stage 1 through stage 4 chronic kidney disease, or unspecified chronic kidney disease: Secondary | ICD-10-CM | POA: Diagnosis not present

## 2020-02-05 DIAGNOSIS — N183 Chronic kidney disease, stage 3 unspecified: Secondary | ICD-10-CM | POA: Diagnosis not present

## 2020-02-08 DIAGNOSIS — E1151 Type 2 diabetes mellitus with diabetic peripheral angiopathy without gangrene: Secondary | ICD-10-CM | POA: Diagnosis not present

## 2020-02-08 DIAGNOSIS — M800AXD Age-related osteoporosis with current pathological fracture, other site, subsequent encounter for fracture with routine healing: Secondary | ICD-10-CM | POA: Diagnosis not present

## 2020-02-08 DIAGNOSIS — I129 Hypertensive chronic kidney disease with stage 1 through stage 4 chronic kidney disease, or unspecified chronic kidney disease: Secondary | ICD-10-CM | POA: Diagnosis not present

## 2020-02-08 DIAGNOSIS — D631 Anemia in chronic kidney disease: Secondary | ICD-10-CM | POA: Diagnosis not present

## 2020-02-08 DIAGNOSIS — N183 Chronic kidney disease, stage 3 unspecified: Secondary | ICD-10-CM | POA: Diagnosis not present

## 2020-02-08 DIAGNOSIS — E1122 Type 2 diabetes mellitus with diabetic chronic kidney disease: Secondary | ICD-10-CM | POA: Diagnosis not present

## 2020-02-10 DIAGNOSIS — E1151 Type 2 diabetes mellitus with diabetic peripheral angiopathy without gangrene: Secondary | ICD-10-CM | POA: Diagnosis not present

## 2020-02-10 DIAGNOSIS — D631 Anemia in chronic kidney disease: Secondary | ICD-10-CM | POA: Diagnosis not present

## 2020-02-10 DIAGNOSIS — M800AXD Age-related osteoporosis with current pathological fracture, other site, subsequent encounter for fracture with routine healing: Secondary | ICD-10-CM | POA: Diagnosis not present

## 2020-02-10 DIAGNOSIS — N183 Chronic kidney disease, stage 3 unspecified: Secondary | ICD-10-CM | POA: Diagnosis not present

## 2020-02-10 DIAGNOSIS — E1122 Type 2 diabetes mellitus with diabetic chronic kidney disease: Secondary | ICD-10-CM | POA: Diagnosis not present

## 2020-02-10 DIAGNOSIS — I129 Hypertensive chronic kidney disease with stage 1 through stage 4 chronic kidney disease, or unspecified chronic kidney disease: Secondary | ICD-10-CM | POA: Diagnosis not present

## 2020-02-15 ENCOUNTER — Other Ambulatory Visit (HOSPITAL_COMMUNITY): Payer: Self-pay | Admitting: *Deleted

## 2020-02-16 ENCOUNTER — Other Ambulatory Visit: Payer: Self-pay

## 2020-02-16 ENCOUNTER — Ambulatory Visit (HOSPITAL_COMMUNITY)
Admission: RE | Admit: 2020-02-16 | Discharge: 2020-02-16 | Disposition: A | Discharge status: Home / Self Care | Payer: Medicare Other | Source: Ambulatory Visit | Attending: Gastroenterology | Admitting: Gastroenterology

## 2020-02-16 DIAGNOSIS — I129 Hypertensive chronic kidney disease with stage 1 through stage 4 chronic kidney disease, or unspecified chronic kidney disease: Secondary | ICD-10-CM | POA: Diagnosis not present

## 2020-02-16 DIAGNOSIS — D500 Iron deficiency anemia secondary to blood loss (chronic): Secondary | ICD-10-CM | POA: Insufficient documentation

## 2020-02-16 DIAGNOSIS — N183 Chronic kidney disease, stage 3 unspecified: Secondary | ICD-10-CM | POA: Diagnosis not present

## 2020-02-16 DIAGNOSIS — E1122 Type 2 diabetes mellitus with diabetic chronic kidney disease: Secondary | ICD-10-CM | POA: Diagnosis not present

## 2020-02-16 DIAGNOSIS — E1151 Type 2 diabetes mellitus with diabetic peripheral angiopathy without gangrene: Secondary | ICD-10-CM | POA: Diagnosis not present

## 2020-02-16 DIAGNOSIS — M800AXD Age-related osteoporosis with current pathological fracture, other site, subsequent encounter for fracture with routine healing: Secondary | ICD-10-CM | POA: Diagnosis not present

## 2020-02-16 DIAGNOSIS — D631 Anemia in chronic kidney disease: Secondary | ICD-10-CM | POA: Diagnosis not present

## 2020-02-16 LAB — IRON AND TIBC
Iron: 51 ug/dL (ref 28–170)
Saturation Ratios: 15 % (ref 10.4–31.8)
TIBC: 342 ug/dL (ref 250–450)
UIBC: 291 ug/dL

## 2020-02-16 LAB — CBC
HCT: 33.1 % — ABNORMAL LOW (ref 36.0–46.0)
Hemoglobin: 10 g/dL — ABNORMAL LOW (ref 12.0–15.0)
MCH: 27.3 pg (ref 26.0–34.0)
MCHC: 30.2 g/dL (ref 30.0–36.0)
MCV: 90.4 fL (ref 80.0–100.0)
Platelets: 273 10*3/uL (ref 150–400)
RBC: 3.66 MIL/uL — ABNORMAL LOW (ref 3.87–5.11)
RDW: 15.5 % (ref 11.5–15.5)
WBC: 6.7 10*3/uL (ref 4.0–10.5)
nRBC: 0 % (ref 0.0–0.2)

## 2020-02-16 LAB — FERRITIN: Ferritin: 52 ng/mL (ref 11–307)

## 2020-02-16 MED ORDER — SODIUM CHLORIDE 0.9 % IV SOLN
510.0000 mg | INTRAVENOUS | Status: DC
  Administered 2020-02-16: 510 mg via INTRAVENOUS
  Filled 2020-02-16: qty 17

## 2020-02-18 DIAGNOSIS — E1151 Type 2 diabetes mellitus with diabetic peripheral angiopathy without gangrene: Secondary | ICD-10-CM | POA: Diagnosis not present

## 2020-02-18 DIAGNOSIS — I129 Hypertensive chronic kidney disease with stage 1 through stage 4 chronic kidney disease, or unspecified chronic kidney disease: Secondary | ICD-10-CM | POA: Diagnosis not present

## 2020-02-18 DIAGNOSIS — N183 Chronic kidney disease, stage 3 unspecified: Secondary | ICD-10-CM | POA: Diagnosis not present

## 2020-02-18 DIAGNOSIS — M800AXD Age-related osteoporosis with current pathological fracture, other site, subsequent encounter for fracture with routine healing: Secondary | ICD-10-CM | POA: Diagnosis not present

## 2020-02-18 DIAGNOSIS — E1122 Type 2 diabetes mellitus with diabetic chronic kidney disease: Secondary | ICD-10-CM | POA: Diagnosis not present

## 2020-02-18 DIAGNOSIS — D631 Anemia in chronic kidney disease: Secondary | ICD-10-CM | POA: Diagnosis not present

## 2020-02-29 DIAGNOSIS — D510 Vitamin B12 deficiency anemia due to intrinsic factor deficiency: Secondary | ICD-10-CM | POA: Diagnosis not present

## 2020-03-15 ENCOUNTER — Other Ambulatory Visit: Payer: Self-pay

## 2020-03-15 ENCOUNTER — Ambulatory Visit (HOSPITAL_COMMUNITY)
Admission: RE | Admit: 2020-03-15 | Discharge: 2020-03-15 | Disposition: A | Discharge status: Home / Self Care | Payer: Medicare Other | Source: Ambulatory Visit | Attending: Gastroenterology | Admitting: Gastroenterology

## 2020-03-15 DIAGNOSIS — D500 Iron deficiency anemia secondary to blood loss (chronic): Secondary | ICD-10-CM | POA: Diagnosis not present

## 2020-03-15 LAB — CBC
HCT: 36.9 % (ref 36.0–46.0)
Hemoglobin: 11.3 g/dL — ABNORMAL LOW (ref 12.0–15.0)
MCH: 28.1 pg (ref 26.0–34.0)
MCHC: 30.6 g/dL (ref 30.0–36.0)
MCV: 91.8 fL (ref 80.0–100.0)
Platelets: 230 10*3/uL (ref 150–400)
RBC: 4.02 MIL/uL (ref 3.87–5.11)
RDW: 16.1 % — ABNORMAL HIGH (ref 11.5–15.5)
WBC: 7 10*3/uL (ref 4.0–10.5)
nRBC: 0 % (ref 0.0–0.2)

## 2020-03-15 LAB — IRON AND TIBC
Iron: 114 ug/dL (ref 28–170)
Saturation Ratios: 38 % — ABNORMAL HIGH (ref 10.4–31.8)
TIBC: 298 ug/dL (ref 250–450)
UIBC: 184 ug/dL

## 2020-03-15 LAB — FERRITIN: Ferritin: 68 ng/mL (ref 11–307)

## 2020-03-15 MED ORDER — SODIUM CHLORIDE 0.9 % IV SOLN
510.0000 mg | INTRAVENOUS | Status: AC
  Administered 2020-03-15: 510 mg via INTRAVENOUS
  Filled 2020-03-15: qty 17

## 2020-03-22 DIAGNOSIS — I129 Hypertensive chronic kidney disease with stage 1 through stage 4 chronic kidney disease, or unspecified chronic kidney disease: Secondary | ICD-10-CM | POA: Diagnosis not present

## 2020-03-22 DIAGNOSIS — M7989 Other specified soft tissue disorders: Secondary | ICD-10-CM | POA: Diagnosis not present

## 2020-03-29 DIAGNOSIS — D510 Vitamin B12 deficiency anemia due to intrinsic factor deficiency: Secondary | ICD-10-CM | POA: Diagnosis not present

## 2020-03-29 DIAGNOSIS — I5030 Unspecified diastolic (congestive) heart failure: Secondary | ICD-10-CM | POA: Diagnosis not present

## 2020-03-29 DIAGNOSIS — N1832 Chronic kidney disease, stage 3b: Secondary | ICD-10-CM | POA: Diagnosis not present

## 2020-03-29 DIAGNOSIS — E039 Hypothyroidism, unspecified: Secondary | ICD-10-CM | POA: Diagnosis not present

## 2020-03-29 DIAGNOSIS — I129 Hypertensive chronic kidney disease with stage 1 through stage 4 chronic kidney disease, or unspecified chronic kidney disease: Secondary | ICD-10-CM | POA: Diagnosis not present

## 2020-03-29 DIAGNOSIS — E1121 Type 2 diabetes mellitus with diabetic nephropathy: Secondary | ICD-10-CM | POA: Diagnosis not present

## 2020-04-25 DIAGNOSIS — D509 Iron deficiency anemia, unspecified: Secondary | ICD-10-CM | POA: Diagnosis not present

## 2020-04-25 DIAGNOSIS — Z7984 Long term (current) use of oral hypoglycemic drugs: Secondary | ICD-10-CM | POA: Diagnosis not present

## 2020-04-25 DIAGNOSIS — E1121 Type 2 diabetes mellitus with diabetic nephropathy: Secondary | ICD-10-CM | POA: Diagnosis not present

## 2020-05-02 DIAGNOSIS — D510 Vitamin B12 deficiency anemia due to intrinsic factor deficiency: Secondary | ICD-10-CM | POA: Diagnosis not present

## 2020-05-03 DIAGNOSIS — N1832 Chronic kidney disease, stage 3b: Secondary | ICD-10-CM | POA: Diagnosis not present

## 2020-05-03 DIAGNOSIS — I129 Hypertensive chronic kidney disease with stage 1 through stage 4 chronic kidney disease, or unspecified chronic kidney disease: Secondary | ICD-10-CM | POA: Diagnosis not present

## 2020-05-03 DIAGNOSIS — D631 Anemia in chronic kidney disease: Secondary | ICD-10-CM | POA: Diagnosis not present

## 2020-05-03 DIAGNOSIS — E1122 Type 2 diabetes mellitus with diabetic chronic kidney disease: Secondary | ICD-10-CM | POA: Diagnosis not present

## 2020-05-05 DIAGNOSIS — E119 Type 2 diabetes mellitus without complications: Secondary | ICD-10-CM | POA: Diagnosis not present

## 2020-05-05 DIAGNOSIS — Z961 Presence of intraocular lens: Secondary | ICD-10-CM | POA: Diagnosis not present

## 2020-05-30 DIAGNOSIS — D510 Vitamin B12 deficiency anemia due to intrinsic factor deficiency: Secondary | ICD-10-CM | POA: Diagnosis not present

## 2020-06-27 DIAGNOSIS — D510 Vitamin B12 deficiency anemia due to intrinsic factor deficiency: Secondary | ICD-10-CM | POA: Diagnosis not present

## 2020-07-20 DIAGNOSIS — E039 Hypothyroidism, unspecified: Secondary | ICD-10-CM | POA: Diagnosis not present

## 2020-07-20 DIAGNOSIS — E7800 Pure hypercholesterolemia, unspecified: Secondary | ICD-10-CM | POA: Diagnosis not present

## 2020-07-20 DIAGNOSIS — D649 Anemia, unspecified: Secondary | ICD-10-CM | POA: Diagnosis not present

## 2020-07-20 DIAGNOSIS — Z1389 Encounter for screening for other disorder: Secondary | ICD-10-CM | POA: Diagnosis not present

## 2020-07-20 DIAGNOSIS — E042 Nontoxic multinodular goiter: Secondary | ICD-10-CM | POA: Diagnosis not present

## 2020-07-20 DIAGNOSIS — E1121 Type 2 diabetes mellitus with diabetic nephropathy: Secondary | ICD-10-CM | POA: Diagnosis not present

## 2020-07-20 DIAGNOSIS — M810 Age-related osteoporosis without current pathological fracture: Secondary | ICD-10-CM | POA: Diagnosis not present

## 2020-07-20 DIAGNOSIS — Z0000 Encounter for general adult medical examination without abnormal findings: Secondary | ICD-10-CM | POA: Diagnosis not present

## 2020-07-20 DIAGNOSIS — D510 Vitamin B12 deficiency anemia due to intrinsic factor deficiency: Secondary | ICD-10-CM | POA: Diagnosis not present

## 2020-07-20 DIAGNOSIS — Z23 Encounter for immunization: Secondary | ICD-10-CM | POA: Diagnosis not present

## 2020-07-20 DIAGNOSIS — I5030 Unspecified diastolic (congestive) heart failure: Secondary | ICD-10-CM | POA: Diagnosis not present

## 2020-07-20 DIAGNOSIS — I129 Hypertensive chronic kidney disease with stage 1 through stage 4 chronic kidney disease, or unspecified chronic kidney disease: Secondary | ICD-10-CM | POA: Diagnosis not present

## 2020-07-27 DIAGNOSIS — Z862 Personal history of diseases of the blood and blood-forming organs and certain disorders involving the immune mechanism: Secondary | ICD-10-CM | POA: Diagnosis not present

## 2020-08-15 DIAGNOSIS — M85851 Other specified disorders of bone density and structure, right thigh: Secondary | ICD-10-CM | POA: Diagnosis not present

## 2020-08-15 DIAGNOSIS — Z1231 Encounter for screening mammogram for malignant neoplasm of breast: Secondary | ICD-10-CM | POA: Diagnosis not present

## 2020-08-15 DIAGNOSIS — Z780 Asymptomatic menopausal state: Secondary | ICD-10-CM | POA: Diagnosis not present

## 2020-08-22 DIAGNOSIS — D510 Vitamin B12 deficiency anemia due to intrinsic factor deficiency: Secondary | ICD-10-CM | POA: Diagnosis not present

## 2020-09-19 DIAGNOSIS — Z23 Encounter for immunization: Secondary | ICD-10-CM | POA: Diagnosis not present

## 2020-09-19 DIAGNOSIS — D510 Vitamin B12 deficiency anemia due to intrinsic factor deficiency: Secondary | ICD-10-CM | POA: Diagnosis not present

## 2020-10-17 DIAGNOSIS — D510 Vitamin B12 deficiency anemia due to intrinsic factor deficiency: Secondary | ICD-10-CM | POA: Diagnosis not present

## 2020-10-24 DIAGNOSIS — N1832 Chronic kidney disease, stage 3b: Secondary | ICD-10-CM | POA: Diagnosis not present

## 2020-10-24 DIAGNOSIS — E1122 Type 2 diabetes mellitus with diabetic chronic kidney disease: Secondary | ICD-10-CM | POA: Diagnosis not present

## 2020-10-24 DIAGNOSIS — I129 Hypertensive chronic kidney disease with stage 1 through stage 4 chronic kidney disease, or unspecified chronic kidney disease: Secondary | ICD-10-CM | POA: Diagnosis not present

## 2020-10-24 DIAGNOSIS — D631 Anemia in chronic kidney disease: Secondary | ICD-10-CM | POA: Diagnosis not present

## 2020-11-14 DIAGNOSIS — D510 Vitamin B12 deficiency anemia due to intrinsic factor deficiency: Secondary | ICD-10-CM | POA: Diagnosis not present

## 2020-12-12 DIAGNOSIS — D510 Vitamin B12 deficiency anemia due to intrinsic factor deficiency: Secondary | ICD-10-CM | POA: Diagnosis not present

## 2021-01-09 DIAGNOSIS — D510 Vitamin B12 deficiency anemia due to intrinsic factor deficiency: Secondary | ICD-10-CM | POA: Diagnosis not present

## 2021-02-07 DIAGNOSIS — N1832 Chronic kidney disease, stage 3b: Secondary | ICD-10-CM | POA: Diagnosis not present

## 2021-02-07 DIAGNOSIS — E039 Hypothyroidism, unspecified: Secondary | ICD-10-CM | POA: Diagnosis not present

## 2021-02-07 DIAGNOSIS — E7800 Pure hypercholesterolemia, unspecified: Secondary | ICD-10-CM | POA: Diagnosis not present

## 2021-02-07 DIAGNOSIS — E1121 Type 2 diabetes mellitus with diabetic nephropathy: Secondary | ICD-10-CM | POA: Diagnosis not present

## 2021-02-07 DIAGNOSIS — D509 Iron deficiency anemia, unspecified: Secondary | ICD-10-CM | POA: Diagnosis not present

## 2021-02-07 DIAGNOSIS — D510 Vitamin B12 deficiency anemia due to intrinsic factor deficiency: Secondary | ICD-10-CM | POA: Diagnosis not present

## 2021-02-07 DIAGNOSIS — I5030 Unspecified diastolic (congestive) heart failure: Secondary | ICD-10-CM | POA: Diagnosis not present

## 2021-02-07 DIAGNOSIS — Z7984 Long term (current) use of oral hypoglycemic drugs: Secondary | ICD-10-CM | POA: Diagnosis not present

## 2021-02-07 DIAGNOSIS — G629 Polyneuropathy, unspecified: Secondary | ICD-10-CM | POA: Diagnosis not present

## 2021-02-07 DIAGNOSIS — I129 Hypertensive chronic kidney disease with stage 1 through stage 4 chronic kidney disease, or unspecified chronic kidney disease: Secondary | ICD-10-CM | POA: Diagnosis not present

## 2021-02-20 ENCOUNTER — Other Ambulatory Visit (HOSPITAL_COMMUNITY): Payer: Self-pay | Admitting: *Deleted

## 2021-02-21 ENCOUNTER — Other Ambulatory Visit: Payer: Self-pay

## 2021-02-21 ENCOUNTER — Encounter (HOSPITAL_COMMUNITY)
Admission: RE | Admit: 2021-02-21 | Discharge: 2021-02-21 | Disposition: A | Discharge status: Home / Self Care | Payer: Medicare Other | Source: Ambulatory Visit | Attending: Gastroenterology | Admitting: Gastroenterology

## 2021-02-21 DIAGNOSIS — D500 Iron deficiency anemia secondary to blood loss (chronic): Secondary | ICD-10-CM | POA: Diagnosis not present

## 2021-02-21 MED ORDER — SODIUM CHLORIDE 0.9 % IV SOLN
510.0000 mg | INTRAVENOUS | Status: DC
  Administered 2021-02-21: 510 mg via INTRAVENOUS
  Filled 2021-02-21: qty 510

## 2021-02-28 ENCOUNTER — Other Ambulatory Visit: Payer: Self-pay

## 2021-02-28 ENCOUNTER — Encounter (HOSPITAL_COMMUNITY)
Admission: RE | Admit: 2021-02-28 | Discharge: 2021-02-28 | Disposition: A | Discharge status: Home / Self Care | Payer: Medicare Other | Source: Ambulatory Visit | Attending: Gastroenterology | Admitting: Gastroenterology

## 2021-02-28 DIAGNOSIS — D500 Iron deficiency anemia secondary to blood loss (chronic): Secondary | ICD-10-CM | POA: Diagnosis not present

## 2021-02-28 MED ORDER — SODIUM CHLORIDE 0.9 % IV SOLN
510.0000 mg | INTRAVENOUS | Status: AC
  Administered 2021-02-28: 510 mg via INTRAVENOUS
  Filled 2021-02-28: qty 510

## 2021-03-07 DIAGNOSIS — D510 Vitamin B12 deficiency anemia due to intrinsic factor deficiency: Secondary | ICD-10-CM | POA: Diagnosis not present

## 2021-03-30 DIAGNOSIS — D509 Iron deficiency anemia, unspecified: Secondary | ICD-10-CM | POA: Diagnosis not present

## 2021-04-03 DIAGNOSIS — D510 Vitamin B12 deficiency anemia due to intrinsic factor deficiency: Secondary | ICD-10-CM | POA: Diagnosis not present

## 2021-05-01 DIAGNOSIS — I129 Hypertensive chronic kidney disease with stage 1 through stage 4 chronic kidney disease, or unspecified chronic kidney disease: Secondary | ICD-10-CM | POA: Diagnosis not present

## 2021-05-01 DIAGNOSIS — E1122 Type 2 diabetes mellitus with diabetic chronic kidney disease: Secondary | ICD-10-CM | POA: Diagnosis not present

## 2021-05-01 DIAGNOSIS — N1832 Chronic kidney disease, stage 3b: Secondary | ICD-10-CM | POA: Diagnosis not present

## 2021-05-01 DIAGNOSIS — D631 Anemia in chronic kidney disease: Secondary | ICD-10-CM | POA: Diagnosis not present

## 2021-05-09 DIAGNOSIS — N1832 Chronic kidney disease, stage 3b: Secondary | ICD-10-CM | POA: Diagnosis not present

## 2021-05-09 DIAGNOSIS — D510 Vitamin B12 deficiency anemia due to intrinsic factor deficiency: Secondary | ICD-10-CM | POA: Diagnosis not present

## 2021-05-23 DIAGNOSIS — H524 Presbyopia: Secondary | ICD-10-CM | POA: Diagnosis not present

## 2021-05-23 DIAGNOSIS — Z961 Presence of intraocular lens: Secondary | ICD-10-CM | POA: Diagnosis not present

## 2021-05-23 DIAGNOSIS — H52201 Unspecified astigmatism, right eye: Secondary | ICD-10-CM | POA: Diagnosis not present

## 2021-05-23 DIAGNOSIS — E119 Type 2 diabetes mellitus without complications: Secondary | ICD-10-CM | POA: Diagnosis not present

## 2021-06-05 DIAGNOSIS — D510 Vitamin B12 deficiency anemia due to intrinsic factor deficiency: Secondary | ICD-10-CM | POA: Diagnosis not present

## 2021-07-03 DIAGNOSIS — D509 Iron deficiency anemia, unspecified: Secondary | ICD-10-CM | POA: Diagnosis not present

## 2021-07-03 DIAGNOSIS — D510 Vitamin B12 deficiency anemia due to intrinsic factor deficiency: Secondary | ICD-10-CM | POA: Diagnosis not present

## 2021-07-31 DIAGNOSIS — D510 Vitamin B12 deficiency anemia due to intrinsic factor deficiency: Secondary | ICD-10-CM | POA: Diagnosis not present

## 2021-08-17 DIAGNOSIS — Z1231 Encounter for screening mammogram for malignant neoplasm of breast: Secondary | ICD-10-CM | POA: Diagnosis not present

## 2021-08-28 DIAGNOSIS — R011 Cardiac murmur, unspecified: Secondary | ICD-10-CM | POA: Diagnosis not present

## 2021-08-28 DIAGNOSIS — M810 Age-related osteoporosis without current pathological fracture: Secondary | ICD-10-CM | POA: Diagnosis not present

## 2021-08-28 DIAGNOSIS — E039 Hypothyroidism, unspecified: Secondary | ICD-10-CM | POA: Diagnosis not present

## 2021-08-28 DIAGNOSIS — I5030 Unspecified diastolic (congestive) heart failure: Secondary | ICD-10-CM | POA: Diagnosis not present

## 2021-08-28 DIAGNOSIS — D509 Iron deficiency anemia, unspecified: Secondary | ICD-10-CM | POA: Diagnosis not present

## 2021-08-28 DIAGNOSIS — D510 Vitamin B12 deficiency anemia due to intrinsic factor deficiency: Secondary | ICD-10-CM | POA: Diagnosis not present

## 2021-08-28 DIAGNOSIS — Z1389 Encounter for screening for other disorder: Secondary | ICD-10-CM | POA: Diagnosis not present

## 2021-08-28 DIAGNOSIS — E1121 Type 2 diabetes mellitus with diabetic nephropathy: Secondary | ICD-10-CM | POA: Diagnosis not present

## 2021-08-28 DIAGNOSIS — Z0000 Encounter for general adult medical examination without abnormal findings: Secondary | ICD-10-CM | POA: Diagnosis not present

## 2021-08-28 DIAGNOSIS — E7800 Pure hypercholesterolemia, unspecified: Secondary | ICD-10-CM | POA: Diagnosis not present

## 2021-08-28 DIAGNOSIS — G629 Polyneuropathy, unspecified: Secondary | ICD-10-CM | POA: Diagnosis not present

## 2021-08-28 DIAGNOSIS — D0510 Intraductal carcinoma in situ of unspecified breast: Secondary | ICD-10-CM | POA: Diagnosis not present

## 2021-08-28 DIAGNOSIS — Z23 Encounter for immunization: Secondary | ICD-10-CM | POA: Diagnosis not present

## 2021-10-02 DIAGNOSIS — D510 Vitamin B12 deficiency anemia due to intrinsic factor deficiency: Secondary | ICD-10-CM | POA: Diagnosis not present

## 2021-10-18 DIAGNOSIS — D509 Iron deficiency anemia, unspecified: Secondary | ICD-10-CM | POA: Diagnosis not present

## 2021-10-23 ENCOUNTER — Other Ambulatory Visit (HOSPITAL_COMMUNITY): Payer: Self-pay | Admitting: *Deleted

## 2021-10-24 ENCOUNTER — Encounter (HOSPITAL_COMMUNITY)
Admission: RE | Admit: 2021-10-24 | Discharge: 2021-10-24 | Disposition: A | Discharge status: Home / Self Care | Payer: Medicare Other | Source: Ambulatory Visit | Attending: Gastroenterology | Admitting: Gastroenterology

## 2021-10-24 ENCOUNTER — Other Ambulatory Visit: Payer: Self-pay

## 2021-10-24 DIAGNOSIS — D500 Iron deficiency anemia secondary to blood loss (chronic): Secondary | ICD-10-CM | POA: Insufficient documentation

## 2021-10-24 MED ORDER — SODIUM CHLORIDE 0.9 % IV SOLN
510.0000 mg | INTRAVENOUS | Status: DC
  Administered 2021-10-24: 510 mg via INTRAVENOUS
  Filled 2021-10-24: qty 510

## 2021-10-30 DIAGNOSIS — D510 Vitamin B12 deficiency anemia due to intrinsic factor deficiency: Secondary | ICD-10-CM | POA: Diagnosis not present

## 2021-10-31 ENCOUNTER — Encounter (HOSPITAL_COMMUNITY)
Admission: RE | Admit: 2021-10-31 | Discharge: 2021-10-31 | Disposition: A | Discharge status: Home / Self Care | Payer: Medicare Other | Source: Ambulatory Visit | Attending: Gastroenterology | Admitting: Gastroenterology

## 2021-10-31 DIAGNOSIS — D500 Iron deficiency anemia secondary to blood loss (chronic): Secondary | ICD-10-CM | POA: Diagnosis not present

## 2021-10-31 MED ORDER — SODIUM CHLORIDE 0.9 % IV SOLN
510.0000 mg | INTRAVENOUS | Status: AC
  Administered 2021-10-31: 510 mg via INTRAVENOUS
  Filled 2021-10-31: qty 510

## 2021-11-27 DIAGNOSIS — D510 Vitamin B12 deficiency anemia due to intrinsic factor deficiency: Secondary | ICD-10-CM | POA: Diagnosis not present

## 2021-12-25 DIAGNOSIS — D510 Vitamin B12 deficiency anemia due to intrinsic factor deficiency: Secondary | ICD-10-CM | POA: Diagnosis not present

## 2021-12-28 DIAGNOSIS — D509 Iron deficiency anemia, unspecified: Secondary | ICD-10-CM | POA: Diagnosis not present

## 2022-01-16 DIAGNOSIS — D509 Iron deficiency anemia, unspecified: Secondary | ICD-10-CM | POA: Diagnosis not present

## 2022-01-22 DIAGNOSIS — D510 Vitamin B12 deficiency anemia due to intrinsic factor deficiency: Secondary | ICD-10-CM | POA: Diagnosis not present

## 2022-02-22 ENCOUNTER — Emergency Department (HOSPITAL_COMMUNITY): Payer: Medicare Other

## 2022-02-22 ENCOUNTER — Emergency Department (HOSPITAL_COMMUNITY)
Admission: EM | Admit: 2022-02-22 | Discharge: 2022-02-23 | Disposition: A | Discharge status: Home / Self Care | Payer: Medicare Other | Attending: Emergency Medicine | Admitting: Emergency Medicine

## 2022-02-22 DIAGNOSIS — Y9301 Activity, walking, marching and hiking: Secondary | ICD-10-CM | POA: Diagnosis not present

## 2022-02-22 DIAGNOSIS — M542 Cervicalgia: Secondary | ICD-10-CM | POA: Diagnosis not present

## 2022-02-22 DIAGNOSIS — M7732 Calcaneal spur, left foot: Secondary | ICD-10-CM | POA: Diagnosis not present

## 2022-02-22 DIAGNOSIS — I10 Essential (primary) hypertension: Secondary | ICD-10-CM | POA: Diagnosis not present

## 2022-02-22 DIAGNOSIS — M19072 Primary osteoarthritis, left ankle and foot: Secondary | ICD-10-CM | POA: Diagnosis not present

## 2022-02-22 DIAGNOSIS — M79603 Pain in arm, unspecified: Secondary | ICD-10-CM | POA: Diagnosis not present

## 2022-02-22 DIAGNOSIS — E119 Type 2 diabetes mellitus without complications: Secondary | ICD-10-CM | POA: Insufficient documentation

## 2022-02-22 DIAGNOSIS — Y92009 Unspecified place in unspecified non-institutional (private) residence as the place of occurrence of the external cause: Secondary | ICD-10-CM | POA: Insufficient documentation

## 2022-02-22 DIAGNOSIS — W1839XA Other fall on same level, initial encounter: Secondary | ICD-10-CM | POA: Diagnosis not present

## 2022-02-22 DIAGNOSIS — R519 Headache, unspecified: Secondary | ICD-10-CM | POA: Diagnosis not present

## 2022-02-22 DIAGNOSIS — M7989 Other specified soft tissue disorders: Secondary | ICD-10-CM | POA: Diagnosis not present

## 2022-02-22 DIAGNOSIS — Z043 Encounter for examination and observation following other accident: Secondary | ICD-10-CM | POA: Diagnosis not present

## 2022-02-22 DIAGNOSIS — S42222A 2-part displaced fracture of surgical neck of left humerus, initial encounter for closed fracture: Secondary | ICD-10-CM | POA: Diagnosis not present

## 2022-02-22 DIAGNOSIS — S4992XA Unspecified injury of left shoulder and upper arm, initial encounter: Secondary | ICD-10-CM | POA: Diagnosis present

## 2022-02-22 DIAGNOSIS — Z9889 Other specified postprocedural states: Secondary | ICD-10-CM | POA: Diagnosis not present

## 2022-02-22 DIAGNOSIS — W19XXXA Unspecified fall, initial encounter: Secondary | ICD-10-CM

## 2022-02-22 NOTE — ED Triage Notes (Addendum)
Patient BIB EMS for a mechanical fall. Patient was walking with ice outside when she fell on her left side . Patient is unable to lift her left arm and complains of left shoulder pain . Moderate swelling. Noted. Patient provided an ice pack. Patient has a contusion to her left arm . No hemorrage of bleeding noted . Denies hitting her head . Denies taking blood thinners. H/c of DM and HTN .  ?

## 2022-02-22 NOTE — ED Provider Notes (Signed)
? ?Emergency Department Provider Note ? ? ?I have reviewed the triage vital signs and the nursing notes. ? ? ?HISTORY ? ?Chief Complaint ?Mechanical Fall ? ? ?HPI ?Alisha [name] is a 88 y.o. female with past medical history reviewed below presents with mechanical fall at home.  Patient was there by herself and wheeling some ice out of the car when she lost her balance and fell to the ground.  She landed on her left side and it is mainly hurting in her left shoulder.  She is unsure regarding head trauma.  She states she was able to get up to her knees but then not able to stand.  She is supposed to ambulate with a walker but did not have it with her.  She is she is not having any numbness or weakness into the arms or legs.  She was on the ground for around 2 hours until she was able to shout to her neighbor to come and get her. No CP, abdominal pain, or SOB. Notes an abrasion to the left great toe. No ankle or knee pain.  ? ? ?Past Medical History:  ?Diagnosis Date  ? Anemia   ? takes Ferrous Sulfate daily  ? Arthritis   ? B12 deficiency   ? takes Vit B12 Q3 months  ? Blood transfusion   ? Colon polyps   ? Diabetes mellitus   ? takes Metformin and Glipizide daily  ? Hemorrhoids   ? History of blood transfusion   ? no abnormal reaction noted  ? Hyperlipidemia   ? takes Zetia daily  ? Hypertension   ? takes Diovan and Metoprolol daily  ? Joint pain   ? Multiple thyroid nodules   ? takes Synthroid daily  ? Osteoporosis   ? takes Fosamax every Wed  ? Peripheral vascular disease ([place])   ? DVT  2008 with ? anticoagulation  ? PONV (postoperative nausea and vomiting)   ? ? ?Review of Systems ? ?Constitutional: No fever/chills ?Cardiovascular: Denies chest pain. ?Respiratory: Denies shortness of breath. ?Gastrointestinal: No abdominal pain.  No nausea, no vomiting.  No diarrhea.  No constipation. ?Genitourinary: Negative for dysuria. ?Musculoskeletal: Negative for back pain. Positive left shoulder pain.  ?Skin: Negative for  rash. ?Neurological: Negative for focal weakness or numbness. Mild HA.  ? ? ?____________________________________________ ? ? ?PHYSICAL EXAM: ? ?VITAL SIGNS: ?ED Triage Vitals  ?Enc Vitals Group  ?   BP 02/22/22 2139 (!) 152/91  ?   Pulse Rate 02/22/22 2139 73  ?   Resp 02/22/22 2139 16  ?   Temp 02/22/22 2139 98.4 ?F (36.9 ?C)  ?   Temp src --   ?   SpO2 02/22/22 2139 98 %  ? ?Constitutional: Alert and oriented. Well appearing and in no acute distress. ?Eyes: Conjunctivae are normal.  ?Head: Atraumatic. ?Nose: No congestion/rhinnorhea. ?Mouth/Throat: Mucous membranes are moist.   ?Neck: No stridor. No cervical spine tenderness to palpation. ?Cardiovascular: Normal rate, regular rhythm. Good peripheral circulation. Grossly normal heart sounds.   ?Respiratory: Normal respiratory effort.  No retractions. Lungs CTAB. ?Gastrointestinal: Soft and nontender. No distention.  ?Musculoskeletal: Tenderness to palpation of the left shoulder with pain with any attempted range of motion.  No tenderness to the left elbow or wrist.  Bruising to the left proximal arm. No laceration. Normal ROM of the bilateral LEs and right shoulder.  ?Neurologic:  Normal speech and language. No gross focal neurologic deficits are appreciated.  ?Skin:  Skin is warm and dry.  Bruising to the left upper arm and abrasion to the left great toe.  ?____________________________________________ ? ? ?PROCEDURES ? ?Procedure(s) performed:  ? ?Procedures ? ?None ?____________________________________________ ? ? ?INITIAL IMPRESSION / ASSESSMENT AND PLAN / ED COURSE ? ?Pertinent labs & imaging results that were available during my care of the patient were reviewed by me and considered in my medical decision making (see chart for details). ?  ?This patient is Presenting for Evaluation of fall, which does require a range of treatment options, and is a complaint that involves a high risk of morbidity and mortality. ? ?The Differential Diagnoses proximal humerus  fracture, shoulder dislocation, SDH, SAH, c spine fracture.  ? ? ?I did obtain Additional Historical Information from family at bedside. ? ?Radiologic Tests Ordered, included CT head/c-spine along with plain films. Imaging pending at the time of sign-out.  ? ?Medical Decision Making: Summary:  ? ?Patient presents to the emergency department for evaluation after mechanical fall.  She is mainly hurting in the left shoulder.  Differential includes proximal humerus fracture versus dislocation.  Clinically suspect fracture.  Plan for CT imaging of the head.  Has some abrasion to the knees and abrasion to the left great toe.  Will obtain additional imaging of the left foot and left chest. No abdominal tenderness.  ? ?Care transferred to Dr. Leonette Monarch.  ? ?Disposition: pending ? ?____________________________________________ ? ?FINAL CLINICAL IMPRESSION(S) / ED DIAGNOSES ? ?Final diagnoses:  ?Fall in home, initial encounter  ?Closed 2-part displaced fracture of surgical neck of left humerus, initial encounter  ? ? ? ?NEW OUTPATIENT MEDICATIONS STARTED DURING THIS VISIT: ? ?Discharge Medication List as of 02/23/2022 12:35 AM  ?  ? ?START taking these medications  ? Details  ?oxyCODONE (ROXICODONE) 5 MG immediate release tablet Take 0.5-1 tablets (2.5-5 mg total) by mouth every 6 (six) hours as needed for up to 5 days for severe pain., Starting Fri 02/23/2022, Until Wed 02/28/2022 at 2359, Normal  ?  ?  ? ? ?Note:  This document was prepared using Dragon voice recognition software and may include unintentional dictation errors. ? ?Nanda Quinton, MD, FACEP ?Emergency Medicine ? ?  ?Margette Fast, MD ?02/23/22 1158 ? ?

## 2022-02-23 MED ORDER — ACETAMINOPHEN 500 MG PO TABS
1000.0000 mg | ORAL_TABLET | Freq: Once | ORAL | Status: AC
  Administered 2022-02-23: 1000 mg via ORAL
  Filled 2022-02-23: qty 2

## 2022-02-23 MED ORDER — OXYCODONE HCL 5 MG PO TABS: 2.5000 mg | ORAL_TABLET | Freq: Four times a day (QID) | ORAL | 0 refills | Status: AC | PRN

## 2022-02-23 NOTE — ED Provider Notes (Signed)
I assumed care of this patient.  Please see previous provider note for further details of Hx, PE.  Briefly patient is a 88 y.o. female who presented after mechanical fall at home with left shoulder pain.  Currently pending plain films and CT head of the cervical spine and head. ? ?CT head and cervical spine negative. ?Plain films notable only for a displaced left femoral neck fracture. ?No other injuries. ? ?Patient provided with Tylenol per her request and sling. ?Patient accompanied by family members who will stay with the patient. ?She reports seeing orthopedic surgery at [facility], who is actually on call tonight.  Instructed to call for follow-up appointment to discuss possible operative management. ? ?The patient appears reasonably screened and/or stabilized for discharge and I doubt any other medical condition or other [facility] requiring further screening, evaluation, or treatment in the ED at this time prior to discharge. Safe for discharge with strict return precautions. ? ?Disposition: Discharge ? ?Condition: Good ? ?I have discussed the results, Dx and Tx plan with the patient/family who expressed understanding and agree(s) with the plan. Discharge instructions discussed at length. The patient/family was given strict return precautions who verbalized understanding of the instructions. No further questions at time of discharge.  ? ? ?ED Discharge Orders   ? ?      Ordered  ?  oxyCODONE (ROXICODONE) 5 MG immediate release tablet  Every 6 hours PRN       ? 02/23/22 0030  ? ?  ?  ? ?  ? ? ?[facility] narcotic database reviewed and no active prescriptions noted. ? ? ?Follow Up: ?[facility] ?[address] ?[place] ?[phone] ?Call  ?to schedule an appointment for close follow up for humerus fracture ? ?Kelton Pillar, MD ?[address] ?[address] ?[place] [address] ?[phone] ? ?Call  ?as needed for additional pain  control ? ? ? ? ? ? ?  ?Fatima Blank, MD ?02/23/22 0034 ? ?

## 2022-02-23 NOTE — Discharge Instructions (Addendum)
For pain control you may take at 1000 mg of Tylenol every 8 hours scheduled.  In addition you can take 0.5 to 1 tablet of oxycodone every 6 hours as needed for pain not controlled with the scheduled Tylenol. Please limit acetaminophen (Tylenol) to 4000 mg  for a 24hr period. Please note that other over-the-counter medicine may contain acetaminophen as a component of their ingredients.  ? ? ?

## 2022-02-26 ENCOUNTER — Other Ambulatory Visit: Payer: Self-pay | Admitting: Orthopedic Surgery

## 2022-02-26 DIAGNOSIS — M25512 Pain in left shoulder: Secondary | ICD-10-CM | POA: Diagnosis not present

## 2022-02-26 NOTE — Patient Instructions (Addendum)
DUE TO COVID-19 ONLY ONE VISITOR  (aged 86 and older)  IS ALLOWED TO COME WITH YOU AND STAY IN THE WAITING ROOM ONLY DURING PRE OP AND PROCEDURE.   ?**NO VISITORS ARE ALLOWED IN THE SHORT STAY AREA OR RECOVERY ROOM!!** ? ?IF YOU WILL BE ADMITTED INTO THE HOSPITAL YOU ARE ALLOWED ONLY TWO SUPPORT PEOPLE DURING VISITATION HOURS ONLY (7 AM -8PM)   ?The support person(s) must pass our screening, gel in and out, and wear a mask at all times, including in the patient?s room. ?Patients must also wear a mask when staff or their support person are in the room. ?Visitors GUEST BADGE MUST BE WORN VISIBLY  ?One adult visitor may remain with you overnight and MUST be in the room by 8 P.M. ?  ? ? Your procedure is scheduled on: 03/01/22 ? ? Report to [facility] Main Entrance ? ?  Report to admitting at : 9:00 AM ? ? Call this number if you have problems the morning of surgery [phone] ? ? Do not eat food :After Midnight. ? ? After Midnight you may have the following liquids until: 8:30 AM  DAY OF SURGERY ? ?Water ?Black Coffee (sugar ok, NO MILK/CREAM OR CREAMERS)  ?Tea (sugar ok, NO MILK/CREAM OR CREAMERS) regular and decaf                             ?Plain Jell-O (NO RED)                                           ?Fruit ices (not with fruit pulp, NO RED)                                     ?Popsicles (NO RED)                                                                  ?Juice: apple, WHITE grape, WHITE cranberry ?Sports drinks like Gatorade (NO RED) ?Clear broth(vegetable,chicken,beef) ? ?Drink Gatorade drink AT : 8:30 AM the morning of surgery.   ?  ?The day of surgery:  ?Drink ONE (1) Pre-Surgery Clear Ensure or G2 at AM the morning of surgery. Drink in one sitting. Do not sip.  ?This drink was given to you during your hospital  ?pre-op appointment visit. ?Nothing else to drink after completing the  ?Pre-Surgery Clear Ensure or G2. ?  ?       If you have questions, please contact your surgeon?s office  ?   ?Oral Hygiene is also important to reduce your risk of infection.                                    ?Remember - BRUSH YOUR TEETH THE MORNING OF SURGERY WITH YOUR REGULAR TOOTHPASTE ? ? Do NOT smoke after Midnight ? ? Take these medicines the morning of surgery with A SIP OF WATER:isosorbide,carvedilol,amlodipine.Tylenol as needed.  ? ?How to Manage  Your Diabetes ?Before and After Surgery ? ?Why is it important to control my blood sugar before and after surgery? ?Improving blood sugar levels before and after surgery helps healing and can limit problems. ?A way of improving blood sugar control is eating a healthy diet by: ? Eating less sugar and carbohydrates ? Increasing activity/exercise ? Talking with your doctor about reaching your blood sugar goals ?High blood sugars (greater than 180 mg/dL) can raise your risk of infections and slow your recovery, so you will need to focus on controlling your diabetes during the weeks before surgery. ?Make sure that the doctor who takes care of your diabetes knows about your planned surgery including the date and location. ? ?How do I manage my blood sugar before surgery? ?Check your blood sugar at least 4 times a day, starting 2 days before surgery, to make sure that the level is not too high or low. ?Check your blood sugar the morning of your surgery when you wake up and every 2 hours until you get to the Short Stay unit. ?If your blood sugar is less than 70 mg/dL, you will need to treat for low blood sugar: ?Do not take insulin. ?Treat a low blood sugar (less than 70 mg/dL) with ? cup of clear juice (cranberry or apple), 4 glucose tablets, OR glucose gel. ?Recheck blood sugar in 15 minutes after treatment (to make sure it is greater than 70 mg/dL). If your blood sugar is not greater than 70 mg/dL on recheck, call [phone] for further instructions. ?Report your blood sugar to the short stay nurse when you get to Short Stay. ? ?If you are admitted to the hospital after  surgery: ?Your blood sugar will be checked by the staff and you will probably be given insulin after surgery (instead of oral diabetes medicines) to make sure you have good blood sugar levels. ?The goal for blood sugar control after surgery is 80-180 mg/dL. ? ? ?WHAT DO I DO ABOUT MY DIABETES MEDICATION? ? ?Do not take oral diabetes medicines (pills) the morning of surgery. ? ?THE NIGHT BEFORE SURGERY, take [place] and glipizide  as usual.Take half of the semglee insulin dose.     ? ?THE MORNING OF SURGERY, DO NOT TAKE ANY ORAL DIABETIC MEDICATIONS DAY OF YOUR SURGERY ? ?Bring CPAP mask and tubing day of surgery. ?                  ?           You may not have any metal on your body including hair pins, jewelry, and body piercing ? ?           Do not wear make-up, lotions, powders, perfumes/cologne, or deodorant ? ?Do not wear nail polish including gel and S&S, artificial/acrylic nails, or any other type of covering on natural nails including finger and toenails. If you have artificial nails, gel coating, etc. that needs to be removed by a nail salon please have this removed prior to surgery or surgery may need to be canceled/ delayed if the surgeon/ anesthesia feels like they are unable to be safely monitored.  ? ?Do not shave  48 hours prior to surgery. ? ? Do not bring valuables to the hospital. [facility] NOT ?            RESPONSIBLE   FOR VALUABLES. ? ? Contacts, dentures or bridgework may not be worn into surgery. ? ? Bring small overnight bag day of surgery. ?  ?  Patients discharged on the day of surgery will not be allowed to drive home.  Someone NEEDS to stay with you for the first 24 hours after anesthesia. ? ? Special Instructions: Bring a copy of your healthcare power of attorney and living will documents         the day of surgery if you haven't scanned them before. ? ?            Please read over the following fact sheets you were given: IF [place] [phone] ? ?   [facility] - Preparing for Surgery ?Before surgery, you can play an important role.  Because skin is not sterile, your skin needs to be as free of germs as possible.  You can reduce the number of germs on your skin by washing with CHG (chlorahexidine gluconate) soap before surgery.  CHG is an antiseptic cleaner which kills germs and bonds with the skin to continue killing germs even after washing. ?Please DO NOT use if you have an allergy to CHG or antibacterial soaps.  If your skin becomes reddened/irritated stop using the CHG and inform your nurse when you arrive at Short Stay. ?Do not shave (including legs and underarms) for at least 48 hours prior to the first CHG shower.  You may shave your face/neck. ?Please follow these instructions carefully: ? 1.  Shower with CHG Soap the night before surgery and the  morning of Surgery. ? 2.  If you choose to wash your hair, wash your hair first as usual with your  normal  shampoo. ? 3.  After you shampoo, rinse your hair and body thoroughly to remove the  shampoo.                           4.  Use CHG as you would any other liquid soap.  You can apply chg directly  to the skin and wash  ?                     Gently with a scrungie or clean washcloth. ? 5.  Apply the CHG Soap to your body ONLY FROM THE NECK DOWN.   Do not use on face/ open      ?                     Wound or open sores. Avoid contact with eyes, ears mouth and genitals (private parts).  ?                     Production manager,  Genitals (private parts) with your normal soap. ?            6.  Wash thoroughly, paying special attention to the area where your surgery  will be performed. ? 7.  Thoroughly rinse your body with warm water from the neck down. ? 8.  DO NOT shower/wash with your normal soap after using and rinsing off  the CHG Soap. ?               9.  Pat yourself dry with a clean towel. ?           10.  Wear clean pajamas. ?           11.  Place clean sheets on your bed the night of  your first shower and do not  sleep with  pets. ?Day of Surgery : ?Do not apply any lotions/deodorants the morning of surgery.  Please wear clean clothes to the hospital/surgery center. ? ?FAILURE TO FOLLOW

## 2022-02-27 ENCOUNTER — Other Ambulatory Visit: Payer: Self-pay

## 2022-02-27 ENCOUNTER — Encounter (HOSPITAL_COMMUNITY)
Admission: RE | Admit: 2022-02-27 | Discharge: 2022-02-27 | Disposition: A | Discharge status: Home / Self Care | Payer: Medicare Other | Source: Ambulatory Visit | Attending: Orthopedic Surgery | Admitting: Orthopedic Surgery

## 2022-02-27 ENCOUNTER — Encounter (HOSPITAL_COMMUNITY): Payer: Self-pay

## 2022-02-27 VITALS — BP 172/76 | HR 61 | Temp 97.8°F | Ht 61.0 in | Wt 157.0 lb

## 2022-02-27 DIAGNOSIS — W19XXXA Unspecified fall, initial encounter: Secondary | ICD-10-CM | POA: Diagnosis not present

## 2022-02-27 DIAGNOSIS — N189 Chronic kidney disease, unspecified: Secondary | ICD-10-CM | POA: Insufficient documentation

## 2022-02-27 DIAGNOSIS — Z01812 Encounter for preprocedural laboratory examination: Secondary | ICD-10-CM | POA: Diagnosis not present

## 2022-02-27 DIAGNOSIS — E119 Type 2 diabetes mellitus without complications: Secondary | ICD-10-CM

## 2022-02-27 DIAGNOSIS — S42212A Unspecified displaced fracture of surgical neck of left humerus, initial encounter for closed fracture: Secondary | ICD-10-CM | POA: Insufficient documentation

## 2022-02-27 DIAGNOSIS — D649 Anemia, unspecified: Secondary | ICD-10-CM | POA: Diagnosis not present

## 2022-02-27 DIAGNOSIS — E1122 Type 2 diabetes mellitus with diabetic chronic kidney disease: Secondary | ICD-10-CM | POA: Insufficient documentation

## 2022-02-27 DIAGNOSIS — E1142 Type 2 diabetes mellitus with diabetic polyneuropathy: Secondary | ICD-10-CM | POA: Insufficient documentation

## 2022-02-27 DIAGNOSIS — Z853 Personal history of malignant neoplasm of breast: Secondary | ICD-10-CM | POA: Diagnosis not present

## 2022-02-27 DIAGNOSIS — M810 Age-related osteoporosis without current pathological fracture: Secondary | ICD-10-CM | POA: Insufficient documentation

## 2022-02-27 DIAGNOSIS — I10 Essential (primary) hypertension: Secondary | ICD-10-CM

## 2022-02-27 DIAGNOSIS — Z01818 Encounter for other preprocedural examination: Secondary | ICD-10-CM

## 2022-02-27 DIAGNOSIS — Z9010 Acquired absence of unspecified breast and nipple: Secondary | ICD-10-CM | POA: Insufficient documentation

## 2022-02-27 DIAGNOSIS — I129 Hypertensive chronic kidney disease with stage 1 through stage 4 chronic kidney disease, or unspecified chronic kidney disease: Secondary | ICD-10-CM | POA: Diagnosis not present

## 2022-02-27 DIAGNOSIS — S90819A Abrasion, unspecified foot, initial encounter: Secondary | ICD-10-CM | POA: Diagnosis not present

## 2022-02-27 HISTORY — DX: Hypothyroidism, unspecified: E03.9

## 2022-02-27 HISTORY — DX: Pneumonia, unspecified organism: J18.9

## 2022-02-27 HISTORY — DX: Chronic kidney disease, unspecified: N18.9

## 2022-02-27 LAB — SURGICAL PCR SCREEN
MRSA, PCR: NEGATIVE
Staphylococcus aureus: NEGATIVE

## 2022-02-27 LAB — CBC
HCT: 36.1 % (ref 36.0–46.0)
Hemoglobin: 11.3 g/dL — ABNORMAL LOW (ref 12.0–15.0)
MCH: 28.2 pg (ref 26.0–34.0)
MCHC: 31.3 g/dL (ref 30.0–36.0)
MCV: 90 fL (ref 80.0–100.0)
Platelets: 233 10*3/uL (ref 150–400)
RBC: 4.01 MIL/uL (ref 3.87–5.11)
RDW: 13.6 % (ref 11.5–15.5)
WBC: 8.2 10*3/uL (ref 4.0–10.5)
nRBC: 0 % (ref 0.0–0.2)

## 2022-02-27 LAB — BASIC METABOLIC PANEL
Anion gap: 8 (ref 5–15)
BUN: 30 mg/dL — ABNORMAL HIGH (ref 8–23)
CO2: 28 mmol/L (ref 22–32)
Calcium: 8.7 mg/dL — ABNORMAL LOW (ref 8.9–10.3)
Chloride: 103 mmol/L (ref 98–111)
Creatinine, Ser: 1.58 mg/dL — ABNORMAL HIGH (ref 0.44–1.00)
GFR, Estimated: 31 mL/min — ABNORMAL LOW (ref >60)
Glucose, Bld: 137 mg/dL — ABNORMAL HIGH (ref 70–99)
Potassium: 5.1 mmol/L (ref 3.5–5.1)
Sodium: 139 mmol/L (ref 135–145)

## 2022-02-27 LAB — GLUCOSE, CAPILLARY: Glucose-Capillary: 127 mg/dL — ABNORMAL HIGH (ref 70–99)

## 2022-02-27 LAB — HEMOGLOBIN A1C
Hgb A1c MFr Bld: 7.1 % — ABNORMAL HIGH (ref 4.8–5.6)
Mean Plasma Glucose: 157.07 mg/dL

## 2022-02-27 NOTE — Progress Notes (Addendum)
For Short Stay: ?[place] appointment date: ?Date of COVID positive in last 90 days: ? ?Bowel Prep reminder: ? ? ?For Anesthesia: ?PCP - Dr. Kelton Pillar ?Cardiologist -  ? ?Chest x-ray - 02/22/22 ?EKG -  ?Stress Test -  ?ECHO - 04/12/17 ?Cardiac Cath -  ?Pacemaker/ICD device last checked: ?Pacemaker orders received: ?Device Rep notified: ? ?Spinal Cord Stimulator: ? ?Sleep Study -  ?CPAP -  ? ?Fasting Blood Sugar -  ?Checks Blood Sugar _____ times a day ?Date and result of last Hgb A1c- ? ?Blood Thinner Instructions: ?Aspirin Instructions: Pt. Is not taking it now. ?Last Dose: ? ?Activity level: Can go up a flight of stairs and activities of daily living without stopping and without chest pain and/or shortness of breath ?  Able to exercise without chest pain and/or shortness of breath ?  Unable to go up a flight of stairs without chest pain and/or shortness of breath ?   ? ?Anesthesia review: Hx: HTN,DIA,DVT,CKD III. ? ?Patient denies shortness of breath, fever, cough and chest pain at PAT appointment ? ? ?Patient verbalized understanding of instructions that were given to them at the PAT appointment. Patient was also instructed that they will need to review over the PAT instructions again at home before surgery.  ?

## 2022-02-27 NOTE — Progress Notes (Signed)
Pt. Has a small wound under her left big toe,the site is swollen and shows some redness around the open area.Pt. denies any pain or drainage.Levada Dy PA was notified,she personally assessed the pt.,spoke to pt. And her son.She recommended to notified pcp.  ?

## 2022-02-28 NOTE — Anesthesia Preprocedure Evaluation (Addendum)
Anesthesia Evaluation  ?Patient identified by MRN, date of birth, ID band ?Patient awake ? ? ? ?Reviewed: ?Allergy & Precautions, H&P , NPO status , Patient's Chart, lab work & pertinent test results ? ?History of Anesthesia Complications ?(+) PONV and history of anesthetic complications ? ?Airway ?Mallampati: II ? ?TM Distance: >3 FB ?Neck ROM: Full ? ? ? Dental ?no notable dental hx. ?(+) Teeth Intact, Dental Advisory Given, Partial Upper, Partial Lower ?  ?Pulmonary ?neg pulmonary ROS,  ?  ?Pulmonary exam normal ?breath sounds clear to auscultation ? ? ? ? ? ? Cardiovascular ?Exercise Tolerance: Good ?hypertension, Pt. on medications ?negative cardio ROS ?Normal cardiovascular exam ?Rhythm:Regular Rate:Normal ? ? ?  ?Neuro/Psych ?PSYCHIATRIC DISORDERS negative neurological ROS ? negative psych ROS  ? GI/Hepatic ?negative GI ROS, Neg liver ROS,   ?Endo/Other  ?negative endocrine ROSdiabetesHypothyroidism  ? Renal/GU ?negative Renal ROS  ?negative genitourinary ?  ?Musculoskeletal ?negative musculoskeletal ROS ?(+)  ? Abdominal ?  ?Peds ?negative pediatric ROS ?(+)  Hematology ?negative hematology ROS ?(+) Blood dyscrasia, anemia ,   ?Anesthesia Other Findings ?Echo 04/12/17:  ?- Left ventricle: The cavity size was normal. There was mild focal basal hypertrophy of the septum. Systolic function was normal. The estimated ejection fraction was in the range of 55% to 60%. Wall motion was normal; there were no regional wall motion abnormalities. Doppler parameters are consistent with abnormal left ventricular relaxation (grade 1 diastolic dysfunction).  ?- Aortic valve: There was no stenosis. There was mild regurgitation.  ?- Mitral valve: Mildly calcified annulus. There was trivial regurgitation.  ?- Right ventricle: The cavity size was normal. Systolic function was normal.  ?- Tricuspid valve: Peak RV-RA gradient (S): 23 mm Hg.  ?- Pulmonary arteries: PA peak pressure: 26 mm Hg (S).   ?- Impressions:  Normal LV size with mild focal basal septal hypertrophy. EF 55-60%. Normal RV size and systolic function. Mild aortic insufficiency. ? Reproductive/Obstetrics ?negative OB ROS ? ?  ? ? ? ? ? ? ? ? ? ? ? ? ? ?  ?  ? ? ? ? ? ? ?Anesthesia Physical ?Anesthesia Plan ? ?ASA: 4 ? ?Anesthesia Plan: General  ? ?Post-op Pain Management: Regional block* and Minimal or no pain anticipated  ? ?Induction: Intravenous ? ?PONV Risk Score and Plan: 3 and Ondansetron, Dexamethasone and Treatment may vary due to age or medical condition ? ?Airway Management Planned: Oral ETT ? ?Additional Equipment: None ? ?Intra-op Plan:  ? ?Post-operative Plan: Extubation in OR ? ?Informed Consent: I have reviewed the patients History and Physical, chart, labs and discussed the procedure including the risks, benefits and alternatives for the proposed anesthesia with the patient or authorized representative who has indicated his/her understanding and acceptance.  ? ? ? ?Dental advisory given ? ?Plan Discussed with: Anesthesiologist and CRNA ? ?Anesthesia Plan Comments: (See APP note by Durel Salts, FNP )  ? ? ? ? ?Anesthesia Quick Evaluation ? ?

## 2022-02-28 NOTE — Progress Notes (Signed)
Anesthesia Consult ? ? Case: [id number] Date/Time: 03/01/22 1140  ? Procedure: LEFT REVERSE SHOULDER ARTHROPLASTY (Left: Shoulder)  ? Anesthesia type: Choice  ? Pre-op diagnosis: left shoulder proxmial humerus fracture  ? [facility]: [facility]OM 08 / [facility]  ? Surgeons: Tania Ade, MD  ? ?  ? ? ?DISCUSSION: ?Pt is 88 years old with hx HTN, DM, anemia, osteoporosis, CKD, breast cancer (s/p R mastectomy 2015) ? ?Saw pt in pre-surgical testing. She fell 02/22/22 and fx L proximal humerus and also scraped L great toe on the medial side. L great toe is swollen with some erythema that blanches. Not warm to the touch. Minimally painful or tender with palpation. Pt reports she has neuropathy. Cap refill in other toes of L foot brisk. Pt denies purulent drainage from wound. Pt reports she doesn't check her blood glucose regularly but when she does, it is ~130. HbA1c at pre-surgical testing is 7.1, and WBC is normal.  I notified Bethena Roys in Dr. Bettina Gavia office. Pt is to notify her PCP of foot wound to arrange f/u as she is at right for poor wound healing/outcomes given DM hx and peripheral neuropathy.  ? ? ?VS: BP (!) 172/76   Pulse 61   Temp 36.6 ?C (Oral)   Ht '5\' 1"'$  (1.549 m)   Wt 71.2 kg   SpO2 99%   BMI 29.66 kg/m?  ? ?PROVIDERS: ?- PCP is Kelton Pillar, MD ? ? ?LABS: Labs reviewed: Acceptable for surgery. ?(all labs ordered are listed, but only abnormal results are displayed) ? ?Labs Reviewed  ?CBC - Abnormal; Notable for the following components:  ?    Result Value  ? Hemoglobin 11.3 (*)   ? All other components within normal limits  ?BASIC METABOLIC PANEL - Abnormal; Notable for the following components:  ? Glucose, Bld 137 (*)   ? BUN 30 (*)   ? Creatinine, Ser 1.58 (*)   ? Calcium 8.7 (*)   ? GFR, Estimated 31 (*)   ? All other components within normal limits  ?HEMOGLOBIN A1C - Abnormal; Notable for the following components:  ? Hgb A1c MFr Bld 7.1 (*)   ? All other components within normal limits  ?GLUCOSE,  CAPILLARY - Abnormal; Notable for the following components:  ? Glucose-Capillary 127 (*)   ? All other components within normal limits  ?SURGICAL PCR SCREEN  ?TYPE AND SCREEN  ? ? ? ?IMAGES: ?CXR 02/22/22:  ?1. Displaced left humeral neck fracture. ?2. No other acute cardiopulmonary process. ? ?EKG: Will be obtained day of surgery  ? ? ?CV: ?Echo 04/12/17:  ?- Left ventricle: The cavity size was normal. There was mild focal basal hypertrophy of the septum. Systolic function was normal. The estimated ejection fraction was in the range of 55% to 60%. Wall motion was normal; there were no regional wall motion abnormalities. Doppler parameters are consistent with abnormal left ventricular relaxation (grade 1 diastolic dysfunction).  ?- Aortic valve: There was no stenosis. There was mild regurgitation.  ?- Mitral valve: Mildly calcified annulus. There was trivial regurgitation.  ?- Right ventricle: The cavity size was normal. Systolic function was normal.  ?- Tricuspid valve: Peak RV-RA gradient (S): 23 mm Hg.  ?- Pulmonary arteries: PA peak pressure: 26 mm Hg (S).  ?- Impressions:  Normal LV size with mild focal basal septal hypertrophy. EF 55-60%. Normal RV size and systolic function. Mild aortic insufficiency. ? ? ?Past Medical History:  ?Diagnosis Date  ? Anemia   ? takes Ferrous Sulfate  daily  ? Arthritis   ? B12 deficiency   ? takes Vit B12 Q3 months  ? Blood transfusion   ? Chronic kidney disease   ? Colon polyps   ? Diabetes mellitus   ? takes Metformin and Glipizide daily  ? Hemorrhoids   ? History of blood transfusion   ? no abnormal reaction noted  ? Hyperlipidemia   ? takes Zetia daily  ? Hypertension   ? takes Diovan and Metoprolol daily  ? Hypothyroidism   ? Joint pain   ? Multiple thyroid nodules   ? takes Synthroid daily  ? Osteoporosis   ? takes Fosamax every Wed  ? Peripheral vascular disease ([place])   ? DVT  2008 with ? anticoagulation  ? Pneumonia   ? PONV (postoperative nausea and vomiting)   ? ? ?Past  Surgical History:  ?Procedure Laterality Date  ? COLONOSCOPY WITH PROPOFOL N/A 12/06/2014  ? Procedure: COLONOSCOPY WITH PROPOFOL;  Surgeon: Garlan Fair, MD;  [facility];  Service: Endoscopy;  Laterality: N/A;  ? ESOPHAGOGASTRODUODENOSCOPY (EGD) WITH PROPOFOL N/A 12/06/2014  ? Procedure: ESOPHAGOGASTRODUODENOSCOPY (EGD) WITH PROPOFOL;  Surgeon: Garlan Fair, MD;  [facility];  Service: Endoscopy;  Laterality: N/A;  ? FEMUR SURGERY Left 07/08/11  ? plates and screws  ? FRACTURE SURGERY    ? 8/12 femur fx with rod and bolt placed  ? HEMORRHOIDECTOMY WITH HEMORRHOID BANDING    ? HERNIA REPAIR  1945  ? IR ANGIO EXTRACRAN SEL COM CAROTID INNOMINATE UNI BILAT MOD SED  06/13/2018  ? IR ANGIO VERTEBRAL SEL SUBCLAVIAN INNOMINATE UNI R MOD SED  06/13/2018  ? IR ANGIO VERTEBRAL SEL VERTEBRAL UNI L MOD SED  06/13/2018  ? MASTECTOMY W/ SENTINEL NODE BIOPSY Right 05/13/2014  ? Procedure: MASTECTOMY WITH SENTINEL LYMPH NODE BIOPSY;  Surgeon: Stark Klein, MD;  Location: [place];  Service: General;  Laterality: Right;  ? THYROIDECTOMY  12/04/2011  ? Procedure: THYROIDECTOMY;  Surgeon: Earnstine Regal, MD;  [facility];  Service: General;  Laterality: N/A;  total thyroidectomy  ? ? ?MEDICATIONS: ? acetaminophen (TYLENOL) 325 MG tablet  ? amLODipine (NORVASC) 5 MG tablet  ? aspirin EC 81 MG tablet  ? Calcium Carbonate (CALTRATE 600 PO)  ? carvedilol (COREG) 25 MG tablet  ? cholecalciferol (VITAMIN D3) 25 MCG (1000 UNIT) tablet  ? cyanocobalamin (,VITAMIN B-12,) 1000 MCG/ML injection  ? ferrous sulfate 325 (65 FE) MG tablet  ? gabapentin (NEURONTIN) 100 MG capsule  ? glipiZIDE (GLUCOTROL XL) 5 MG 24 hr tablet  ? isosorbide mononitrate (IMDUR) 30 MG 24 hr tablet  ? levothyroxine (SYNTHROID) 100 MCG tablet  ? loratadine (CLARITIN) 10 MG tablet  ? methocarbamol (ROBAXIN) 500 MG tablet  ? Multiple Vitamin (MULTIVITAMIN WITH MINERALS) TABS tablet  ? oxyCODONE (ROXICODONE) 5 MG immediate release tablet  ? SEMGLEE,  YFGN, 100 UNIT/ML Pen  ? senna-docusate (SENOKOT-S) 8.6-50 MG tablet  ? sitaGLIPtin (JANUVIA) 50 MG tablet  ? UNABLE TO FIND  ? ZETIA 10 MG tablet  ? ?No current facility-administered medications for this encounter.  ? ? ?If no changes, I anticipate pt can proceed with surgery as scheduled.  ? ?Willeen Cass, PhD, FNP-BC ?[facility]/Anesthesiology ?Phone: [phone] ?02/28/2022 10:16 AM ? ? ? ? ? ? ? ? ? ?

## 2022-03-01 ENCOUNTER — Other Ambulatory Visit: Payer: Self-pay

## 2022-03-01 ENCOUNTER — Encounter (HOSPITAL_COMMUNITY): Payer: Self-pay | Admitting: Orthopedic Surgery

## 2022-03-01 ENCOUNTER — Ambulatory Visit (HOSPITAL_BASED_OUTPATIENT_CLINIC_OR_DEPARTMENT_OTHER): Payer: Medicare Other | Admitting: Anesthesiology

## 2022-03-01 ENCOUNTER — Encounter (HOSPITAL_COMMUNITY)
Admission: RE | Disposition: A | Discharge status: Home / Self Care | Payer: Self-pay | Source: Ambulatory Visit | Attending: Orthopedic Surgery

## 2022-03-01 ENCOUNTER — Observation Stay (HOSPITAL_COMMUNITY): Payer: Medicare Other

## 2022-03-01 ENCOUNTER — Ambulatory Visit (HOSPITAL_COMMUNITY): Payer: Medicare Other | Admitting: Emergency Medicine

## 2022-03-01 ENCOUNTER — Observation Stay (HOSPITAL_COMMUNITY)
Admission: RE | Admit: 2022-03-01 | Discharge: 2022-03-02 | Disposition: A | Discharge status: Home / Self Care | Payer: Medicare Other | Source: Ambulatory Visit | Attending: Orthopedic Surgery | Admitting: Orthopedic Surgery

## 2022-03-01 DIAGNOSIS — Z7982 Long term (current) use of aspirin: Secondary | ICD-10-CM | POA: Diagnosis not present

## 2022-03-01 DIAGNOSIS — Z7984 Long term (current) use of oral hypoglycemic drugs: Secondary | ICD-10-CM | POA: Insufficient documentation

## 2022-03-01 DIAGNOSIS — Z86718 Personal history of other venous thrombosis and embolism: Secondary | ICD-10-CM | POA: Diagnosis not present

## 2022-03-01 DIAGNOSIS — Z9181 History of falling: Secondary | ICD-10-CM | POA: Diagnosis not present

## 2022-03-01 DIAGNOSIS — Y92009 Unspecified place in unspecified non-institutional (private) residence as the place of occurrence of the external cause: Secondary | ICD-10-CM | POA: Insufficient documentation

## 2022-03-01 DIAGNOSIS — I10 Essential (primary) hypertension: Secondary | ICD-10-CM | POA: Diagnosis not present

## 2022-03-01 DIAGNOSIS — E039 Hypothyroidism, unspecified: Secondary | ICD-10-CM

## 2022-03-01 DIAGNOSIS — Z79899 Other long term (current) drug therapy: Secondary | ICD-10-CM | POA: Diagnosis not present

## 2022-03-01 DIAGNOSIS — G8918 Other acute postprocedural pain: Secondary | ICD-10-CM | POA: Diagnosis not present

## 2022-03-01 DIAGNOSIS — E890 Postprocedural hypothyroidism: Secondary | ICD-10-CM | POA: Diagnosis not present

## 2022-03-01 DIAGNOSIS — S42292A Other displaced fracture of upper end of left humerus, initial encounter for closed fracture: Secondary | ICD-10-CM | POA: Diagnosis not present

## 2022-03-01 DIAGNOSIS — S42202A Unspecified fracture of upper end of left humerus, initial encounter for closed fracture: Secondary | ICD-10-CM | POA: Diagnosis not present

## 2022-03-01 DIAGNOSIS — S42201A Unspecified fracture of upper end of right humerus, initial encounter for closed fracture: Secondary | ICD-10-CM | POA: Diagnosis not present

## 2022-03-01 DIAGNOSIS — J439 Emphysema, unspecified: Secondary | ICD-10-CM | POA: Diagnosis not present

## 2022-03-01 DIAGNOSIS — W19XXXA Unspecified fall, initial encounter: Secondary | ICD-10-CM | POA: Diagnosis not present

## 2022-03-01 DIAGNOSIS — R2681 Unsteadiness on feet: Secondary | ICD-10-CM | POA: Insufficient documentation

## 2022-03-01 DIAGNOSIS — E1122 Type 2 diabetes mellitus with diabetic chronic kidney disease: Secondary | ICD-10-CM | POA: Diagnosis not present

## 2022-03-01 DIAGNOSIS — I129 Hypertensive chronic kidney disease with stage 1 through stage 4 chronic kidney disease, or unspecified chronic kidney disease: Secondary | ICD-10-CM | POA: Diagnosis not present

## 2022-03-01 DIAGNOSIS — D638 Anemia in other chronic diseases classified elsewhere: Secondary | ICD-10-CM | POA: Diagnosis not present

## 2022-03-01 DIAGNOSIS — Z96612 Presence of left artificial shoulder joint: Secondary | ICD-10-CM

## 2022-03-01 DIAGNOSIS — E119 Type 2 diabetes mellitus without complications: Secondary | ICD-10-CM | POA: Diagnosis not present

## 2022-03-01 DIAGNOSIS — Z96642 Presence of left artificial hip joint: Secondary | ICD-10-CM | POA: Diagnosis not present

## 2022-03-01 DIAGNOSIS — Z471 Aftercare following joint replacement surgery: Secondary | ICD-10-CM | POA: Diagnosis not present

## 2022-03-01 DIAGNOSIS — S42352A Displaced comminuted fracture of shaft of humerus, left arm, initial encounter for closed fracture: Secondary | ICD-10-CM | POA: Diagnosis not present

## 2022-03-01 DIAGNOSIS — N189 Chronic kidney disease, unspecified: Secondary | ICD-10-CM | POA: Diagnosis not present

## 2022-03-01 HISTORY — PX: REVERSE SHOULDER ARTHROPLASTY: SHX5054

## 2022-03-01 LAB — GLUCOSE, CAPILLARY
Glucose-Capillary: 130 mg/dL — ABNORMAL HIGH (ref 70–99)
Glucose-Capillary: 129 mg/dL — ABNORMAL HIGH (ref 70–99)
Glucose-Capillary: 187 mg/dL — ABNORMAL HIGH (ref 70–99)
Glucose-Capillary: 195 mg/dL — ABNORMAL HIGH (ref 70–99)

## 2022-03-01 SURGERY — ARTHROPLASTY, SHOULDER, TOTAL, REVERSE
Anesthesia: General | Site: Shoulder | Laterality: Left

## 2022-03-01 MED ORDER — GLIPIZIDE ER 5 MG PO TB24
5.0000 mg | ORAL_TABLET | Freq: Every day | ORAL | Status: DC
  Administered 2022-03-02: 5 mg via ORAL
  Filled 2022-03-01: qty 1

## 2022-03-01 MED ORDER — DEXAMETHASONE SODIUM PHOSPHATE 10 MG/ML IJ SOLN
INTRAMUSCULAR | Status: DC | PRN
  Administered 2022-03-01: 4 mg via INTRAVENOUS

## 2022-03-01 MED ORDER — ACETAMINOPHEN 500 MG PO TABS
1000.0000 mg | ORAL_TABLET | Freq: Four times a day (QID) | ORAL | Status: AC
  Administered 2022-03-01 – 2022-03-02 (×4): 1000 mg via ORAL
  Filled 2022-03-01 (×4): qty 2

## 2022-03-01 MED ORDER — BISACODYL 5 MG PO TBEC: 5.0000 mg | DELAYED_RELEASE_TABLET | Freq: Every day | ORAL | Status: DC | PRN

## 2022-03-01 MED ORDER — OXYCODONE HCL 5 MG PO TABS: 5.0000 mg | ORAL_TABLET | Freq: Once | ORAL | Status: DC | PRN

## 2022-03-01 MED ORDER — HYDROMORPHONE HCL 1 MG/ML IJ SOLN: 0.5000 mg | INTRAMUSCULAR | Status: DC | PRN

## 2022-03-01 MED ORDER — PHENYLEPHRINE HCL-NACL 20-0.9 MG/250ML-% IV SOLN
INTRAVENOUS | Status: AC
  Filled 2022-03-01: qty 250

## 2022-03-01 MED ORDER — SODIUM CHLORIDE 0.9 % IV SOLN: INTRAVENOUS | Status: DC

## 2022-03-01 MED ORDER — FLEET ENEMA 7-19 GM/118ML RE ENEM: 1.0000 | ENEMA | Freq: Once | RECTAL | Status: DC | PRN

## 2022-03-01 MED ORDER — ONDANSETRON HCL 4 MG/2ML IJ SOLN
INTRAMUSCULAR | Status: DC | PRN
  Administered 2022-03-01: 4 mg via INTRAVENOUS

## 2022-03-01 MED ORDER — FENTANYL CITRATE (PF) 100 MCG/2ML IJ SOLN
INTRAMUSCULAR | Status: AC
Start: 2022-03-01 — End: ?
  Filled 2022-03-01: qty 2

## 2022-03-01 MED ORDER — METOCLOPRAMIDE HCL 5 MG/ML IJ SOLN: 5.0000 mg | Freq: Three times a day (TID) | INTRAMUSCULAR | Status: DC | PRN

## 2022-03-01 MED ORDER — LACTATED RINGERS IV SOLN: INTRAVENOUS | Status: DC

## 2022-03-01 MED ORDER — OXYCODONE HCL 5 MG PO TABS: 10.0000 mg | ORAL_TABLET | ORAL | Status: DC | PRN

## 2022-03-01 MED ORDER — CARVEDILOL 25 MG PO TABS
25.0000 mg | ORAL_TABLET | Freq: Two times a day (BID) | ORAL | Status: DC
  Administered 2022-03-01 – 2022-03-02 (×2): 25 mg via ORAL
  Filled 2022-03-01 (×2): qty 1

## 2022-03-01 MED ORDER — HYDROMORPHONE HCL 2 MG PO TABS: 1.0000 mg | ORAL_TABLET | ORAL | Status: DC | PRN

## 2022-03-01 MED ORDER — ONDANSETRON HCL 4 MG/2ML IJ SOLN
INTRAMUSCULAR | Status: AC
  Filled 2022-03-01: qty 2

## 2022-03-01 MED ORDER — 0.9 % SODIUM CHLORIDE (POUR BTL) OPTIME
TOPICAL | Status: DC | PRN
  Administered 2022-03-01: 1000 mL

## 2022-03-01 MED ORDER — FENTANYL CITRATE PF 50 MCG/ML IJ SOSY: 25.0000 ug | PREFILLED_SYRINGE | INTRAMUSCULAR | Status: DC | PRN

## 2022-03-01 MED ORDER — DIPHENHYDRAMINE HCL 12.5 MG/5ML PO ELIX: 12.5000 mg | ORAL_SOLUTION | ORAL | Status: DC | PRN

## 2022-03-01 MED ORDER — DOCUSATE SODIUM 100 MG PO CAPS
100.0000 mg | ORAL_CAPSULE | Freq: Two times a day (BID) | ORAL | Status: DC
  Administered 2022-03-01: 100 mg via ORAL
  Filled 2022-03-01 (×2): qty 1

## 2022-03-01 MED ORDER — ALUM & MAG HYDROXIDE-SIMETH 200-200-20 MG/5ML PO SUSP: 30.0000 mL | ORAL | Status: DC | PRN

## 2022-03-01 MED ORDER — ORAL CARE MOUTH RINSE: 15.0000 mL | Freq: Once | OROMUCOSAL | Status: AC

## 2022-03-01 MED ORDER — SUGAMMADEX SODIUM 200 MG/2ML IV SOLN
INTRAVENOUS | Status: DC | PRN
  Administered 2022-03-01: 200 mg via INTRAVENOUS

## 2022-03-01 MED ORDER — LEVOTHYROXINE SODIUM 100 MCG PO TABS
100.0000 ug | ORAL_TABLET | Freq: Every day | ORAL | Status: DC
  Administered 2022-03-02: 100 ug via ORAL
  Filled 2022-03-01: qty 1

## 2022-03-01 MED ORDER — TRANEXAMIC ACID 1000 MG/10ML IV SOLN
2000.0000 mg | INTRAVENOUS | Status: DC
  Filled 2022-03-01: qty 20

## 2022-03-01 MED ORDER — OXYCODONE HCL 5 MG PO TABS: 5.0000 mg | ORAL_TABLET | ORAL | Status: DC | PRN

## 2022-03-01 MED ORDER — CEFAZOLIN SODIUM-DEXTROSE 2-4 GM/100ML-% IV SOLN
2.0000 g | INTRAVENOUS | Status: AC
  Administered 2022-03-01 (×2): 2 g via INTRAVENOUS
  Filled 2022-03-01: qty 100

## 2022-03-01 MED ORDER — PROPOFOL 10 MG/ML IV BOLUS
INTRAVENOUS | Status: DC | PRN
  Administered 2022-03-01: 100 mg via INTRAVENOUS

## 2022-03-01 MED ORDER — ZOLPIDEM TARTRATE 5 MG PO TABS: 5.0000 mg | ORAL_TABLET | Freq: Every evening | ORAL | Status: DC | PRN

## 2022-03-01 MED ORDER — EZETIMIBE 10 MG PO TABS
10.0000 mg | ORAL_TABLET | Freq: Every day | ORAL | Status: DC
  Administered 2022-03-02: 10 mg via ORAL
  Filled 2022-03-01: qty 1

## 2022-03-01 MED ORDER — BUPIVACAINE HCL (PF) 0.5 % IJ SOLN
INTRAMUSCULAR | Status: DC | PRN
  Administered 2022-03-01: 20 mL via PERINEURAL

## 2022-03-01 MED ORDER — ACETAMINOPHEN 325 MG PO TABS: 325.0000 mg | ORAL_TABLET | Freq: Four times a day (QID) | ORAL | Status: DC | PRN

## 2022-03-01 MED ORDER — WATER FOR IRRIGATION, STERILE IR SOLN
Status: DC | PRN
Start: 2022-03-01 — End: 2022-03-01
  Administered 2022-03-01: 2000 mL

## 2022-03-01 MED ORDER — TRANEXAMIC ACID-NACL 1000-0.7 MG/100ML-% IV SOLN
1000.0000 mg | INTRAVENOUS | Status: AC
  Administered 2022-03-01: 1000 mg via INTRAVENOUS
  Filled 2022-03-01: qty 100

## 2022-03-01 MED ORDER — GABAPENTIN 300 MG PO CAPS
300.0000 mg | ORAL_CAPSULE | Freq: Every day | ORAL | Status: DC
  Administered 2022-03-01: 300 mg via ORAL
  Filled 2022-03-01: qty 1

## 2022-03-01 MED ORDER — PHENYLEPHRINE HCL-NACL 20-0.9 MG/250ML-% IV SOLN
INTRAVENOUS | Status: DC | PRN
  Administered 2022-03-01: 50 ug/min via INTRAVENOUS

## 2022-03-01 MED ORDER — FENTANYL CITRATE (PF) 250 MCG/5ML IJ SOLN
INTRAMUSCULAR | Status: DC | PRN
Start: 2022-03-01 — End: 2022-03-01
  Administered 2022-03-01: 50 ug via INTRAVENOUS
  Administered 2022-03-01 (×2): 25 ug via INTRAVENOUS

## 2022-03-01 MED ORDER — LIDOCAINE HCL (PF) 2 % IJ SOLN
INTRAMUSCULAR | Status: AC
  Filled 2022-03-01: qty 5

## 2022-03-01 MED ORDER — METOCLOPRAMIDE HCL 5 MG PO TABS: 5.0000 mg | ORAL_TABLET | Freq: Three times a day (TID) | ORAL | Status: DC | PRN

## 2022-03-01 MED ORDER — PROPOFOL 10 MG/ML IV BOLUS
INTRAVENOUS | Status: AC
  Filled 2022-03-01: qty 20

## 2022-03-01 MED ORDER — EPHEDRINE SULFATE-NACL 50-0.9 MG/10ML-% IV SOSY
PREFILLED_SYRINGE | INTRAVENOUS | Status: DC | PRN
  Administered 2022-03-01 (×5): 5 mg via INTRAVENOUS

## 2022-03-01 MED ORDER — METHOCARBAMOL 500 MG PO TABS: 500.0000 mg | ORAL_TABLET | Freq: Four times a day (QID) | ORAL | Status: DC | PRN

## 2022-03-01 MED ORDER — INSULIN ASPART 100 UNIT/ML IJ SOLN
0.0000 [IU] | Freq: Three times a day (TID) | INTRAMUSCULAR | Status: DC
  Administered 2022-03-01: 3 [IU] via SUBCUTANEOUS

## 2022-03-01 MED ORDER — POLYETHYLENE GLYCOL 3350 17 G PO PACK: 17.0000 g | PACK | Freq: Every day | ORAL | Status: DC | PRN

## 2022-03-01 MED ORDER — ACETAMINOPHEN 325 MG PO TABS: 325.0000 mg | ORAL_TABLET | ORAL | Status: DC | PRN

## 2022-03-01 MED ORDER — OXYCODONE HCL 5 MG/5ML PO SOLN: 5.0000 mg | Freq: Once | ORAL | Status: DC | PRN

## 2022-03-01 MED ORDER — ROCURONIUM BROMIDE 10 MG/ML (PF) SYRINGE
PREFILLED_SYRINGE | INTRAVENOUS | Status: DC | PRN
  Administered 2022-03-01: 70 mg via INTRAVENOUS

## 2022-03-01 MED ORDER — MENTHOL 3 MG MT LOZG: 1.0000 | LOZENGE | OROMUCOSAL | Status: DC | PRN

## 2022-03-01 MED ORDER — HYDROMORPHONE HCL 2 MG PO TABS: 2.0000 mg | ORAL_TABLET | ORAL | Status: DC | PRN

## 2022-03-01 MED ORDER — ACETAMINOPHEN 160 MG/5ML PO SOLN: 325.0000 mg | ORAL | Status: DC | PRN

## 2022-03-01 MED ORDER — ROCURONIUM BROMIDE 10 MG/ML (PF) SYRINGE
PREFILLED_SYRINGE | INTRAVENOUS | Status: AC
  Filled 2022-03-01: qty 10

## 2022-03-01 MED ORDER — ISOSORBIDE MONONITRATE ER 30 MG PO TB24
30.0000 mg | ORAL_TABLET | Freq: Every day | ORAL | Status: DC
  Administered 2022-03-01 – 2022-03-02 (×2): 30 mg via ORAL
  Filled 2022-03-01 (×2): qty 1

## 2022-03-01 MED ORDER — CHLORHEXIDINE GLUCONATE 0.12 % MT SOLN
15.0000 mL | Freq: Once | OROMUCOSAL | Status: AC
Start: 2022-03-01 — End: 2022-03-01
  Administered 2022-03-01: 15 mL via OROMUCOSAL

## 2022-03-01 MED ORDER — METHOCARBAMOL 1000 MG/10ML IJ SOLN
500.0000 mg | Freq: Four times a day (QID) | INTRAVENOUS | Status: DC | PRN
  Filled 2022-03-01: qty 5

## 2022-03-01 MED ORDER — SODIUM CHLORIDE 0.9 % IR SOLN
Status: DC | PRN
  Administered 2022-03-01: 1000 mL

## 2022-03-01 MED ORDER — PHENOL 1.4 % MT LIQD: 1.0000 | OROMUCOSAL | Status: DC | PRN

## 2022-03-01 MED ORDER — ONDANSETRON HCL 4 MG PO TABS: 4.0000 mg | ORAL_TABLET | Freq: Four times a day (QID) | ORAL | Status: DC | PRN

## 2022-03-01 MED ORDER — INSULIN GLARGINE-YFGN 100 UNIT/ML ~~LOC~~ SOLN
4.0000 [IU] | Freq: Every day | SUBCUTANEOUS | Status: DC
  Administered 2022-03-01: 4 [IU] via SUBCUTANEOUS
  Filled 2022-03-01 (×2): qty 0.04

## 2022-03-01 MED ORDER — LIDOCAINE 2% (20 MG/ML) 5 ML SYRINGE
INTRAMUSCULAR | Status: DC | PRN
  Administered 2022-03-01: 80 mg via INTRAVENOUS

## 2022-03-01 MED ORDER — DEXAMETHASONE SODIUM PHOSPHATE 10 MG/ML IJ SOLN
INTRAMUSCULAR | Status: AC
  Filled 2022-03-01: qty 1

## 2022-03-01 MED ORDER — ONDANSETRON HCL 4 MG/2ML IJ SOLN: 4.0000 mg | Freq: Four times a day (QID) | INTRAMUSCULAR | Status: DC | PRN

## 2022-03-01 MED ORDER — MEPERIDINE HCL 50 MG/ML IJ SOLN: 6.2500 mg | INTRAMUSCULAR | Status: DC | PRN

## 2022-03-01 MED ORDER — ONDANSETRON HCL 4 MG/2ML IJ SOLN: 4.0000 mg | Freq: Once | INTRAMUSCULAR | Status: DC | PRN

## 2022-03-01 MED ORDER — ASPIRIN EC 81 MG PO TBEC: 81.0000 mg | DELAYED_RELEASE_TABLET | Freq: Every day | ORAL | Status: DC

## 2022-03-01 MED ORDER — EPHEDRINE 5 MG/ML INJ
INTRAVENOUS | Status: AC
  Filled 2022-03-01: qty 5

## 2022-03-01 MED ORDER — BUPIVACAINE LIPOSOME 1.3 % IJ SUSP
INTRAMUSCULAR | Status: DC | PRN
  Administered 2022-03-01: 10 mL via PERINEURAL

## 2022-03-01 SURGICAL SUPPLY — 80 items
BAG COUNTER SPONGE SURGICOUNT (BAG) ×1 IMPLANT
BAG ZIPLOCK 12X15 (MISCELLANEOUS) ×2 IMPLANT
BASEPLATE P2 COATD GLND 6.5X30 (Shoulder) IMPLANT
BIT DRILL 1.6MX128 (BIT) IMPLANT
BIT DRILL 2.5 DIA 127 CALI (BIT) ×1 IMPLANT
BIT DRILL 4 DIA CALIBRATED (BIT) ×1 IMPLANT
BLADE SAW SAG 73X25 THK (BLADE) ×1
BLADE SAW SGTL 73X25 THK (BLADE) ×1 IMPLANT
BOOTIES KNEE HIGH SLOAN (MISCELLANEOUS) ×4 IMPLANT
CLSR STERI-STRIP ANTIMIC 1/2X4 (GAUZE/BANDAGES/DRESSINGS) ×1 IMPLANT
COOLER ICEMAN CLASSIC (MISCELLANEOUS) ×1 IMPLANT
COVER BACK TABLE 60X90IN (DRAPES) ×2 IMPLANT
COVER SURGICAL LIGHT HANDLE (MISCELLANEOUS) ×2 IMPLANT
DRAPE INCISE IOBAN 66X45 STRL (DRAPES) ×2 IMPLANT
DRAPE ORTHO SPLIT 77X108 STRL (DRAPES) ×4
DRAPE POUCH INSTRU U-SHP 10X18 (DRAPES) ×2 IMPLANT
DRAPE SHEET LG 3/4 BI-LAMINATE (DRAPES) ×2 IMPLANT
DRAPE SURG 17X11 SM STRL (DRAPES) ×2 IMPLANT
DRAPE SURG ORHT 6 SPLT 77X108 (DRAPES) ×2 IMPLANT
DRAPE TOP 10253 STERILE (DRAPES) ×2 IMPLANT
DRAPE U-SHAPE 47X51 STRL (DRAPES) ×2 IMPLANT
DRSG AQUACEL AG ADV 3.5X 6 (GAUZE/BANDAGES/DRESSINGS) ×2 IMPLANT
DURAPREP 26ML APPLICATOR (WOUND CARE) ×4 IMPLANT
ELECT BLADE TIP CTD 4 INCH (ELECTRODE) ×2 IMPLANT
ELECT REM PT RETURN 15FT ADLT (MISCELLANEOUS) ×2 IMPLANT
GLOVE BIO SURGEON STRL SZ7 (GLOVE) ×4 IMPLANT
GLOVE BIO SURGEON STRL SZ7.5 (GLOVE) ×2 IMPLANT
GLOVE BIOGEL PI IND STRL 6.5 (GLOVE) ×1 IMPLANT
GLOVE BIOGEL PI IND STRL 7.0 (GLOVE) ×1 IMPLANT
GLOVE BIOGEL PI IND STRL 8 (GLOVE) ×1 IMPLANT
GLOVE BIOGEL PI INDICATOR 6.5 (GLOVE) ×1
GLOVE BIOGEL PI INDICATOR 7.0 (GLOVE) ×3
GLOVE BIOGEL PI INDICATOR 8 (GLOVE) ×1
GLOVE SURG POLYISO LF SZ6.5 (GLOVE) ×4 IMPLANT
GOWN STRL REUS W/ TWL XL LVL3 (GOWN DISPOSABLE) ×1 IMPLANT
GOWN STRL REUS W/TWL XL LVL3 (GOWN DISPOSABLE) ×3
HANDPIECE INTERPULSE COAX TIP (DISPOSABLE) ×2
HOOD PEEL AWAY FLYTE STAYCOOL (MISCELLANEOUS) ×7 IMPLANT
INSERT SMALL SOCKET 32MM NEU (Insert) ×1 IMPLANT
KIT BASIN OR (CUSTOM PROCEDURE TRAY) ×2 IMPLANT
KIT TURNOVER KIT A (KITS) ×1 IMPLANT
MANIFOLD NEPTUNE II (INSTRUMENTS) ×2 IMPLANT
NDL TROCAR POINT SZ 2 1/2 (NEEDLE) IMPLANT
NEEDLE TROCAR POINT SZ 2 1/2 (NEEDLE) IMPLANT
NS IRRIG 1000ML POUR BTL (IV SOLUTION) ×2 IMPLANT
P2 COATDE GLNOID BSEPLT 6.5X30 (Shoulder) ×2 IMPLANT
PACK SHOULDER (CUSTOM PROCEDURE TRAY) ×2 IMPLANT
PAD COLD SHLDR WRAP-ON (PAD) ×1 IMPLANT
PROTECTOR NERVE ULNAR (MISCELLANEOUS) ×1 IMPLANT
RESTRAINT HEAD UNIVERSAL NS (MISCELLANEOUS) ×1 IMPLANT
RETRIEVER SUT HEWSON (MISCELLANEOUS) ×1 IMPLANT
SCREW BONE LOCKING RSP 5.0X14 (Screw) ×4 IMPLANT
SCREW BONE LOCKING RSP 5.0X30 (Screw) ×2 IMPLANT
SCREW BONE LOCKING RSP 5.0X34 (Screw) ×2 IMPLANT
SCREW BONE RSP LOCK 5X14 (Screw) IMPLANT
SCREW BONE RSP LOCK 5X30 (Screw) IMPLANT
SCREW BONE RSP LOCK 5X34 (Screw) IMPLANT
SCREW RETAIN W/HEAD 4MM OFFSET (Shoulder) ×1 IMPLANT
SET HNDPC FAN SPRY TIP SCT (DISPOSABLE) ×1 IMPLANT
SLING ARM FOAM STRAP MED (SOFTGOODS) ×1 IMPLANT
SLING ARM IMMOBILIZER LRG (SOFTGOODS) IMPLANT
SLING ARM IMMOBILIZER MED (SOFTGOODS) ×1 IMPLANT
SPONGE T-LAP 18X18 ~~LOC~~+RFID (SPONGE) ×2 IMPLANT
SPONGE T-LAP 4X18 ~~LOC~~+RFID (SPONGE) ×2 IMPLANT
STEM HUMERAL REVERSE S 10X108 (Stem) ×1 IMPLANT
STRIP CLOSURE SKIN 1/2X4 (GAUZE/BANDAGES/DRESSINGS) ×4 IMPLANT
SUCTION FRAZIER HANDLE 10FR (MISCELLANEOUS) ×1
SUCTION TUBE FRAZIER 10FR DISP (MISCELLANEOUS) IMPLANT
SUPPORT WRAP ARM LG (MISCELLANEOUS) IMPLANT
SUT ETHIBOND 2 V 37 (SUTURE) ×1 IMPLANT
SUT FIBERWIRE #2 38 REV NDL BL (SUTURE)
SUT MNCRL AB 4-0 PS2 18 (SUTURE) ×2 IMPLANT
SUT VIC AB 2-0 CT1 27 (SUTURE) ×1
SUT VIC AB 2-0 CT1 TAPERPNT 27 (SUTURE) ×1 IMPLANT
SUTURE FIBERWR#2 38 REV NDL BL (SUTURE) IMPLANT
TAPE LABRALWHITE 1.5X36 (TAPE) IMPLANT
TAPE SUT LABRALTAP WHT/BLK (SUTURE) ×1 IMPLANT
TOWEL OR 17X26 10 PK STRL BLUE (TOWEL DISPOSABLE) ×2 IMPLANT
TOWEL OR NON WOVEN STRL DISP B (DISPOSABLE) ×2 IMPLANT
WATER STERILE IRR 1000ML POUR (IV SOLUTION) ×2 IMPLANT

## 2022-03-01 NOTE — Anesthesia Procedure Notes (Signed)
Procedure Name: Intubation ?Date/Time: 03/01/2022 7:32 AM ?Performed by: Carrington Mullenax D, CRNA ?Pre-anesthesia Checklist: Patient identified, Emergency Drugs available, Suction available and Patient being monitored ?Patient Re-evaluated:Patient Re-evaluated prior to induction ?Oxygen Delivery Method: Circle system utilized ?Preoxygenation: Pre-oxygenation with 100% oxygen ?Induction Type: IV induction ?Ventilation: Mask ventilation without difficulty ?Laryngoscope Size: Mac and 3 (by Larose Hires) ?Grade View: Grade I ?Tube type: Oral ?Tube size: 7.0 mm ?Number of attempts: 1 ?Airway Equipment and Method: Stylet ?Placement Confirmation: ETT inserted through vocal cords under direct vision, positive ETCO2 and breath sounds checked- equal and bilateral ?Secured at: 21 cm ?Tube secured with: Tape ?Dental Injury: Teeth and Oropharynx as per pre-operative assessment  ? ? ? ? ?

## 2022-03-01 NOTE — Transfer of Care (Signed)
Immediate Anesthesia Transfer of Care Note ? ?Patient: Alisha [name] ? ?Procedure(s) Performed: LEFT REVERSE SHOULDER ARTHROPLASTY (Left: Shoulder) ? ?Patient Location: PACU ? ?Anesthesia Type:General and Regional ? ?Level of Consciousness: awake and alert  ? ?Airway & Oxygen Therapy: Patient Spontanous Breathing and Patient connected to face mask oxygen ? ?Post-op Assessment: Report given to RN and Post -op Vital signs reviewed and stable ? ?Post vital signs: Reviewed and stable ? ?Last Vitals:  ?Vitals Value Taken Time  ?BP 157/78 03/01/22 0910  ?Temp    ?Pulse 79 03/01/22 0912  ?Resp 15 03/01/22 0912  ?SpO2 100 % 03/01/22 0912  ?Vitals shown include unvalidated device data. ? ?Last Pain:  ?Vitals:  ? 03/01/22 0626  ?TempSrc: Oral  ?   ? ?  ? ?Complications: No notable events documented. ?

## 2022-03-01 NOTE — Anesthesia Procedure Notes (Addendum)
Anesthesia Regional Block: Interscalene brachial plexus block  ? ?Pre-Anesthetic Checklist: , timeout performed,  Correct Patient, Correct Site, Correct Laterality,  Correct Procedure, Correct Position, site marked,  Risks and benefits discussed,  Surgical consent,  Pre-op evaluation,  At surgeon's request and post-op pain management ? ?Laterality: Left ? ?Prep: chloraprep     ?  ?Needles:  ?Injection technique: Single-shot ? ?Needle Type: Echogenic Stimulator Needle   ? ? ?Needle Length: 5cm  ?Needle Gauge: 22  ? ? ? ?Additional Needles: ? ? ?Procedures:, nerve stimulator,,, ultrasound used (permanent image in chart),,    ? ?Nerve Stimulator or Paresthesia:  ?Response: hand, 0.45 mA ? ?Additional Responses:  ? ?Narrative:  ?Start time: 03/01/2022 7:00 AM ?End time: 03/01/2022 7:07 AM ?Injection made incrementally with aspirations every 5 mL. ? ?Performed by: Personally  ?Anesthesiologist: Janeece Riggers, MD ? ?Additional Notes: ?Functioning IV was confirmed and monitors were applied.  A 62m 22ga Arrow echogenic stimulator needle was used. Sterile prep and drape,hand hygiene and sterile gloves were used. Ultrasound guidance: relevant anatomy identified, needle position confirmed, local anesthetic spread visualized around nerve(s)., vascular puncture avoided.  Image printed for medical record. Negative aspiration and negative test dose prior to incremental administration of local anesthetic. The patient tolerated the procedure well. ? ? ?NO PIK ? ? ? ?

## 2022-03-01 NOTE — H&P (Signed)
Alisha [name] is an 88 y.o. female.   ?Chief Complaint: L shoulder injury ?HPI: s/p fall with L displaced proximal humerus fracture ? ?Past Medical History:  ?Diagnosis Date  ? Anemia   ? takes Ferrous Sulfate daily  ? Arthritis   ? B12 deficiency   ? takes Vit B12 Q3 months  ? Blood transfusion   ? Chronic kidney disease   ? Colon polyps   ? Diabetes mellitus   ? takes Metformin and Glipizide daily  ? Hemorrhoids   ? History of blood transfusion   ? no abnormal reaction noted  ? Hyperlipidemia   ? takes Zetia daily  ? Hypertension   ? takes Diovan and Metoprolol daily  ? Hypothyroidism   ? Joint pain   ? Multiple thyroid nodules   ? takes Synthroid daily  ? Osteoporosis   ? takes Fosamax every Wed  ? Peripheral vascular disease ([place])   ? DVT  2008 with ? anticoagulation  ? Pneumonia   ? PONV (postoperative nausea and vomiting)   ? ? ?Past Surgical History:  ?Procedure Laterality Date  ? COLONOSCOPY WITH PROPOFOL N/A 12/06/2014  ? Procedure: COLONOSCOPY WITH PROPOFOL;  Surgeon: Garlan Fair, MD;  [facility];  Service: Endoscopy;  Laterality: N/A;  ? ESOPHAGOGASTRODUODENOSCOPY (EGD) WITH PROPOFOL N/A 12/06/2014  ? Procedure: ESOPHAGOGASTRODUODENOSCOPY (EGD) WITH PROPOFOL;  Surgeon: Garlan Fair, MD;  [facility];  Service: Endoscopy;  Laterality: N/A;  ? FEMUR SURGERY Left 07/08/11  ? plates and screws  ? FRACTURE SURGERY    ? 8/12 femur fx with rod and bolt placed  ? HEMORRHOIDECTOMY WITH HEMORRHOID BANDING    ? HERNIA REPAIR  1945  ? IR ANGIO EXTRACRAN SEL COM CAROTID INNOMINATE UNI BILAT MOD SED  06/13/2018  ? IR ANGIO VERTEBRAL SEL SUBCLAVIAN INNOMINATE UNI R MOD SED  06/13/2018  ? IR ANGIO VERTEBRAL SEL VERTEBRAL UNI L MOD SED  06/13/2018  ? MASTECTOMY W/ SENTINEL NODE BIOPSY Right 05/13/2014  ? Procedure: MASTECTOMY WITH SENTINEL LYMPH NODE BIOPSY;  Surgeon: Stark Klein, MD;  Location: [place];  Service: General;  Laterality: Right;  ? THYROIDECTOMY  12/04/2011  ? Procedure: THYROIDECTOMY;   Surgeon: Earnstine Regal, MD;  [facility];  Service: General;  Laterality: N/A;  total thyroidectomy  ? ? ?Family History  ?Problem Relation Age of Onset  ? Hypertension Mother   ? Hypertension Father   ? ?Social History:  reports that she has never smoked. She has never used smokeless tobacco. She reports that she does not drink alcohol and does not use drugs. ? ?Allergies:  ?Allergies  ?Allergen Reactions  ? Ace Inhibitors Cough  ? Codeine Nausea Only  ? Evista [Raloxifene Hydrochloride] Other (See Comments)  ?  cramps ?  ? Zocor [Simvastatin - High Dose] Other (See Comments)  ?  Cramps   ? ? ?Medications Prior to Admission  ?Medication Sig Dispense Refill  ? acetaminophen (TYLENOL) 325 MG tablet Take 2 tablets (650 mg total) by mouth every 6 (six) hours as needed for mild pain or moderate pain. 30 tablet 0  ? Calcium Carbonate (CALTRATE 600 PO) Take 600 mg by mouth daily.    ? carvedilol (COREG) 25 MG tablet Take 1 tablet (25 mg total) by mouth 2 (two) times daily with a meal. 60 tablet 0  ? cholecalciferol (VITAMIN D3) 25 MCG (1000 UNIT) tablet Take 1,000 Units by mouth daily.    ? cyanocobalamin (,VITAMIN B-12,) 1000 MCG/ML injection Inject 1,000  mcg into the muscle every 30 (thirty) days.    ? gabapentin (NEURONTIN) 100 MG capsule Take 300 mg by mouth at bedtime.    ? glipiZIDE (GLUCOTROL XL) 5 MG 24 hr tablet Take 5 mg by mouth daily before breakfast.    ? isosorbide mononitrate (IMDUR) 30 MG 24 hr tablet Take 1 tablet (30 mg total) by mouth daily. 30 tablet 0  ? levothyroxine (SYNTHROID) 100 MCG tablet Take 100 mcg by mouth every morning.    ? loratadine (CLARITIN) 10 MG tablet Take 10 mg by mouth daily.    ? Multiple Vitamin (MULTIVITAMIN WITH MINERALS) TABS tablet Take 1 tablet by mouth every morning.    ? SEMGLEE, YFGN, 100 UNIT/ML Pen Inject 8 Units into the skin at bedtime.    ? ZETIA 10 MG tablet Take 10 mg by mouth daily before breakfast.     ? amLODipine (NORVASC) 5 MG tablet Take 1 tablet (5 mg  total) by mouth daily. 30 tablet 0  ? aspirin EC 81 MG tablet Take 1 tablet (81 mg total) by mouth daily. (Patient not taking: Reported on 02/26/2022) 30 tablet 3  ? ferrous sulfate 325 (65 FE) MG tablet Take 1 tablet (325 mg total) by mouth daily with breakfast. (Patient not taking: Reported on 11/19/2019) 30 tablet 0  ? methocarbamol (ROBAXIN) 500 MG tablet Take 1 tablet (500 mg total) by mouth every 6 (six) hours as needed for muscle spasms. (Patient not taking: Reported on 02/26/2022) 30 tablet 0  ? senna-docusate (SENOKOT-S) 8.6-50 MG tablet Take 1 tablet by mouth at bedtime. (Patient not taking: Reported on 02/26/2022) 30 tablet 0  ? sitaGLIPtin (JANUVIA) 50 MG tablet Take 1 tablet (50 mg total) by mouth daily. (Patient not taking: Reported on 02/26/2022) 30 tablet 0  ? UNABLE TO FIND 174.9 Right Mastectomy  ? ?L8000- Post Surgical Bras-6 ? ?V7616- Silicone Breast Prosthesis-1 1 each 0  ? ? ?Results for orders placed or performed during the hospital encounter of 03/01/22 (from the past 48 hour(s))  ?Glucose, capillary     Status: Abnormal  ? Collection Time: 03/01/22  6:14 AM  ?Result Value Ref Range  ? Glucose-Capillary 130 (H) 70 - 99 mg/dL  ?  Comment: Glucose reference range applies only to samples taken after fasting for at least 8 hours.  ? ?No results found. ? ?Review of Systems  ?All other systems reviewed and are negative. ? ?BMI: ?Body mass index is 29.67 kg/m?. ? ?Lab Results  ?Component Value Date  ? ALBUMIN 2.7 (L) 06/10/2018  ? ? ? ?Diabetes: ?Patient does not have a diagnosis of diabetes. ?Lab Results  ?Component Value Date  ? HGBA1C 7.1 (H) 02/27/2022  ? ? ? ?.  ?  ?Smoking Status: ?  ?nonsmoker ? ? ? ? ? ?  ? ?Blood pressure (!) 164/76, pulse 66, temperature 98.2 ?F (36.8 ?C), temperature source Oral, resp. rate 16, height '5\' 1"'$  (1.549 m), weight 71.2 kg, SpO2 97 %. ?Physical Exam ?HENT:  ?   Head: Atraumatic.  ?Eyes:  ?   Extraocular Movements: Extraocular movements intact.  ?Cardiovascular:  ?    Pulses: Normal pulses.  ?Pulmonary:  ?   Effort: Pulmonary effort is normal.  ?Musculoskeletal:  ?   Comments: L shoulder swollen, ecchymotic.  NVID  ?Neurological:  ?   Mental Status: She is alert.  ?Psychiatric:     ?   Mood and Affect: Mood normal.  ?  ? ?Assessment/Plan ?L displaced proximal humerus fracture in  elderly female ?Plan L reverse TSA ?Risks / benefits of surgery discussed ?Consent on chart  ?NPO for OR ?Preop antibiotics ? ? ?Rhae Hammock, MD ?03/01/2022, 7:15 AM ? ? ? ?

## 2022-03-01 NOTE — Anesthesia Postprocedure Evaluation (Signed)
Anesthesia Post Note ? ?Patient: Alisha [name] ? ?Procedure(s) Performed: LEFT REVERSE SHOULDER ARTHROPLASTY (Left: Shoulder) ? ?  ? ?Patient location during evaluation: PACU ?Anesthesia Type: General ?Level of consciousness: awake and alert ?Pain management: pain level controlled ?Vital Signs Assessment: post-procedure vital signs reviewed and stable ?Respiratory status: spontaneous breathing, nonlabored ventilation, respiratory function stable and patient connected to nasal cannula oxygen ?Cardiovascular status: blood pressure returned to baseline and stable ?Postop Assessment: no apparent nausea or vomiting ?Anesthetic complications: no ? ? ?No notable events documented. ? ?Last Vitals:  ?Vitals:  ? 03/01/22 1115 03/01/22 1322  ?BP: 136/78 140/81  ?Pulse: 81 81  ?Resp: 19 18  ?Temp: (!) 36.4 ?C 36.8 ?C  ?SpO2: 100% 100%  ?  ?Last Pain:  ?Vitals:  ? 03/01/22 1126  ?TempSrc:   ?PainSc: 0-No pain  ? ? ?  ?  ?  ?  ?  ?  ? ?[name] ? ? ? ? ?

## 2022-03-01 NOTE — Op Note (Signed)
Procedure(s): ?LEFT REVERSE SHOULDER ARTHROPLASTY Procedure Note ? ?NIEVES CHAPA ?female ?88 y.o. ?03/01/2022 ? ?Preoperative diagnosis: Left displaced comminuted proximal humerus fracture ? ?Postoperative diagnosis: Same ? ?Procedure(s) and Anesthesia Type: ?   * LEFT REVERSE SHOULDER ARTHROPLASTY - Choice ? ? ?Indications:  88 y.o. female  With left shoulder displaced proximal humerus fracture.  Indicated for reverse total shoulder arthroplasty to try and give her the best possible outcome with pain relief and function. ? ?    ?Surgeon: Rhae Hammock  ? ?Assistants: Forensic psychologist was present and scrubbed throughout the procedure and was essential in positioning, retraction, exposure, and closure) ? ?Anesthesia: General endotracheal anesthesia with preoperative interscalene block given by the attending anesthesiologist ? ? ? ?Procedure Detail ? ?LEFT REVERSE SHOULDER ARTHROPLASTY ? ? ?Estimated Blood Loss:  300 mL ?        ?Drains: none ? ?Blood Given: none  ?        ?Specimens: none ?       ?Complications:  * No complications entered in OR log * ?        ?Disposition: PACU - hemodynamically stable. ?        ?Condition: stable ?   ? ? ?OPERATIVE FINDINGS:  ?A DJO Altivate pressfit reverse total shoulder arthroplasty was placed with a  ?size 10 stem, a 32-4 glenosphere, and a standard-mm poly insert. The base plate  ?fixation was excellent.  The tuberosities were repaired around the implant.  ? ?PROCEDURE: The patient was identified in the preoperative holding area  ?where I personally marked the operative site after verifying site, side,  ?and procedure with the patient. An interscalene block given by  ?the attending anesthesiologist in the holding area and the patient was taken back to the operating room where all extremities were  ?carefully padded in position after general anesthesia was induced. She  ?was placed in a beach-chair position and the operative upper extremity was  ?prepped and  draped in a standard sterile fashion. An approximately 10-  ?cm incision was made from the tip of the coracoid process to the center  ?point of the humerus at the level of the axilla. Dissection was carried  ?down through subcutaneous tissues to the level of the cephalic vein  ?which was taken laterally with the deltoid. The pectoralis major was  ?retracted medially. The subdeltoid space was developed and the lateral  ?edge of the conjoined tendon was identified. The undersurface of  ?conjoined tendon was palpated and the musculocutaneous nerve was not in  ?the field. Retractor was placed underneath the conjoined and second  ?retractor was placed lateral into the deltoid.  ?The biceps tendon was traced proximally to identify the rotator interval.   ?The tuberosities were mobilized with a Cobb elevator and a rondure.  Once adequately mobilized the greater tuberosity was prepared with 4 suture tapes with 2 superior into the inferior.  These were tagged for later repair.  The tuberosities were also controlled with Ethibond sutures in the lesser and greater tuberosity for control. ? ?The glenoid was exposed with the arm in an  ?abducted extended position. The anterior and posterior labrum were  ?completely excised and the capsule was released circumferentially to  ?allow for exposure of the glenoid for preparation.  Any remaining glenoid cartilage was removed.  The 2.5 mm drill was  ?placed using the guide in 5-10? inferior angulation and the tap was then advanced in the same hole. Small and large reamers were then  used. The tap was then removed and the Metaglene was then screwed in with excellent purchase.  The peripheral guide was then used to drilled measured and filled peripheral locking screws. The size 32-4 glenosphere was then impacted on the [facility] taper and the central screw was placed.  ? ?The proximal humeral shaft was then again exposed and the diaphyseal reamers were used to size the canal. The final broach  was left in place in the proximal trial was placed. The joint was reduced and the tuberosities were brought around the implant to assess height and soft tissue tension.  The above implant was felt to be stable and with appropriate soft tissue tension.  Therefore, final humeral stem was placed press-fit with bone graft.  The final polyethylene component was impacted.  The joint was reduced and again the soft tissue tension and length were felt to be appropriate.   ?The tuberosities were then repaired around the implant with 4 suture tapes and 2 FiberWire's including a vertical tension band which gave excellent reconstruction of the tuberosities. ?The joint was then copiously irrigated with pulse lavage and the wound was then closed.  Skin was closed with 2-0 Vicryl in a deep dermal layer and 4-0  ?Monocryl for skin closure. Steri-Strips were applied. Sterile  ?dressings were then applied as well as a sling. The patient was allowed  ?to awaken from general anesthesia, transferred to stretcher, and taken  ?to recovery room in stable condition.  ? ?POSTOPERATIVE PLAN: The patient will be kept in the hospital postoperatively  ?for pain control and therapy.   ?

## 2022-03-01 NOTE — Discharge Instructions (Signed)

## 2022-03-02 ENCOUNTER — Encounter (HOSPITAL_COMMUNITY): Payer: Self-pay | Admitting: Orthopedic Surgery

## 2022-03-02 DIAGNOSIS — S42292A Other displaced fracture of upper end of left humerus, initial encounter for closed fracture: Secondary | ICD-10-CM | POA: Diagnosis not present

## 2022-03-02 DIAGNOSIS — Z86718 Personal history of other venous thrombosis and embolism: Secondary | ICD-10-CM | POA: Diagnosis not present

## 2022-03-02 DIAGNOSIS — E1122 Type 2 diabetes mellitus with diabetic chronic kidney disease: Secondary | ICD-10-CM | POA: Diagnosis not present

## 2022-03-02 DIAGNOSIS — I129 Hypertensive chronic kidney disease with stage 1 through stage 4 chronic kidney disease, or unspecified chronic kidney disease: Secondary | ICD-10-CM | POA: Diagnosis not present

## 2022-03-02 DIAGNOSIS — N189 Chronic kidney disease, unspecified: Secondary | ICD-10-CM | POA: Diagnosis not present

## 2022-03-02 DIAGNOSIS — E890 Postprocedural hypothyroidism: Secondary | ICD-10-CM | POA: Diagnosis not present

## 2022-03-02 LAB — HEMOGLOBIN AND HEMATOCRIT, BLOOD
HCT: 23.8 % — ABNORMAL LOW (ref 36.0–46.0)
HCT: 29.3 % — ABNORMAL LOW (ref 36.0–46.0)
Hemoglobin: 7.5 g/dL — ABNORMAL LOW (ref 12.0–15.0)
Hemoglobin: 9.7 g/dL — ABNORMAL LOW (ref 12.0–15.0)

## 2022-03-02 LAB — GLUCOSE, CAPILLARY
Glucose-Capillary: 109 mg/dL — ABNORMAL HIGH (ref 70–99)
Glucose-Capillary: 97 mg/dL (ref 70–99)

## 2022-03-02 LAB — PREPARE RBC (CROSSMATCH)

## 2022-03-02 MED ORDER — OXYCODONE-ACETAMINOPHEN 5-325 MG PO TABS: 1.0000 | ORAL_TABLET | Freq: Four times a day (QID) | ORAL | 0 refills | Status: AC | PRN

## 2022-03-02 MED ORDER — BUPIVACAINE LIPOSOME 1.3 % IJ SUSP
INTRAMUSCULAR | Status: AC
Start: 2022-03-02 — End: ?
  Filled 2022-03-02: qty 20

## 2022-03-02 MED ORDER — SODIUM CHLORIDE (PF) 0.9 % IJ SOLN
INTRAMUSCULAR | Status: AC
  Filled 2022-03-02: qty 50

## 2022-03-02 MED ORDER — BUPIVACAINE-EPINEPHRINE (PF) 0.25% -1:200000 IJ SOLN
INTRAMUSCULAR | Status: AC
  Filled 2022-03-02: qty 30

## 2022-03-02 MED ORDER — METHOCARBAMOL 500 MG PO TABS: 500.0000 mg | ORAL_TABLET | Freq: Three times a day (TID) | ORAL | 0 refills | Status: DC | PRN

## 2022-03-02 MED ORDER — SODIUM CHLORIDE 0.9% IV SOLUTION: Freq: Once | INTRAVENOUS | Status: AC

## 2022-03-02 NOTE — Progress Notes (Signed)
Patient discharged to home w/ family. Given all belongings, instructions, equipment. Verbalized understanding of instructions. Escorted to pov via w/c. 

## 2022-03-02 NOTE — Progress Notes (Signed)
?  Transition of Care (TOC) Screening Note ? ? ?Patient Details  ?Name: Alisha [name] ?Date of Birth: Mar 15, 1932 ? ? ?Transition of Care (TOC) CM/SW Contact:    ?Jaxiel Kines, LCSW ?Phone Number: ?03/02/2022, 9:50 AM ? ? ? ?Transition of Care Department [facility]) has reviewed patient and no TOC needs have been identified at this time. We will continue to monitor patient advancement through interdisciplinary progression rounds. If new patient transition needs arise, please place a TOC consult. ? ? ?

## 2022-03-02 NOTE — Evaluation (Signed)
Physical Therapy Evaluation ?Patient Details ?Name: Alisha [name] ?MRN: [id number] ?DOB: Apr 08, 1932 ?Today's Date: 03/02/2022 ? ?History of Present Illness ? Briefly patient is a 88 y.o. female who presented after mechanical fall at home with left shoulder pain. CT of head and neck negative. PMH significant for anemia, OA, CKD, DM, HLD, HTN, hypothyroidism, osteoporosis, PVD, previous falls with pubic rami fx's and Lt femoral neck fracture s/p ORIF. ? ?  ?Clinical Impression ? Alisha [name] is a 88 y.o. female POD 1 s/p Lt reverse TSA secondary to fall and humeral fracture. Patient reports modified independence with mobility at baseline. Patient is now limited by functional impairments (see PT problem list below) and requires min assist for bed mobility, transfers, and gait with SBQC. Patient was able to ambulate ~130 feet with SBQC and min assist to steady. Pt required cues for safe cane placement and had multiple episodes of LOB requiring assist to prevent fall. Patient will benefit from continued skilled PT interventions to address impairments and progress towards PLOF. Pt reports family is working on obtained 24/7 assist with home care agency. Acute PT will follow to progress mobility and stair training in preparation for safe discharge home.    ?   ? ?Recommendations for follow up therapy are one component of a multi-disciplinary discharge planning process, led by the attending physician.  Recommendations may be updated based on patient status, additional functional criteria and insurance authorization. ? ?Follow Up Recommendations Home health PT ? ?  ?Assistance Recommended at Discharge Frequent or constant Supervision/Assistance  ?Patient can return home with the following ? A little help with walking and/or transfers;A lot of help with bathing/dressing/bathroom;Assistance with cooking/housework;Direct supervision/assist for medications management;Assist for transportation;Help with stairs or ramp for  entrance ? ?  ?Equipment Recommendations None recommended by PT  ?Recommendations for Other Services ?    ?  ?Functional Status Assessment Patient has had a recent decline in their functional status and demonstrates the ability to make significant improvements in function in a reasonable and predictable amount of time.  ? ?  ?Precautions / Restrictions Precautions ?Precautions: Fall;Shoulder ?Type of Shoulder Precautions: NWB, Sling at all times except ADL/exercise AROM elbow, wrist and hand to tolerance, No PROM/AROM of shoulder ?Shoulder Interventions: Shoulder sling/immobilizer;At all times;Off for dressing/bathing/exercises ?Required Braces or Orthoses: Sling ?Restrictions ?Weight Bearing Restrictions: Yes ?LUE Weight Bearing: Non weight bearing  ? ?  ? ?Mobility ? Bed Mobility ?Overal bed mobility: Needs Assistance ?Bed Mobility: Supine to Sit ?  ?  ?Supine to sit: Min assist, HOB elevated ?  ?  ?General bed mobility comments: cues needed for safety regarding Lt shoulder as pt trying to use momentum to scoot to EOB. Min assist with bed pad and pt using Rt UE on bed rail to pivot. ?  ? ?Transfers ?Overall transfer level: Needs assistance ?Equipment used: Quad cane ?Transfers: Sit to/from Stand ?Sit to Stand: Min assist ?  ?  ?  ?  ?  ?General transfer comment: min assist to steady with rise. pt using Rt UE for power up and transitioned hand to QC safely. ?  ? ?Ambulation/Gait ?Ambulation/Gait assistance: Min assist ?Gait Distance (Feet): 130 Feet ?Assistive device: Quad cane ?Gait Pattern/deviations: Step-through pattern, Decreased stride length, Shuffle, Drifts right/left, Trunk flexed ?Gait velocity: decr ?  ?  ?  ? ?Stairs ?  ?  ?  ?  ?  ? ?Wheelchair Mobility ?  ? ?Modified Rankin (Stroke Patients Only) ?  ? ?  ? ?Balance   ?  ?  ?  ?  ?  ?  ?  ?  ?  ?  ?  ?  ?  ?  ?  ?  ?  ?  ?   ? ? ? ?  Pertinent Vitals/Pain Pain Assessment ?Pain Assessment: No/denies pain  ? ? ?Home Living Family/patient expects to be  discharged to:: Private residence ?Living Arrangements: Alone ?Available Help at Discharge: Family;Available PRN/intermittently ?Type of Home: House ?Home Access: Ramped entrance ?  ?  ?  ?Home Layout: One level ?Home Equipment: Shower seat;Grab bars - tub/shower ?Additional Comments: son calls every evening and neighbors come in  ?  ?Prior Function Prior Level of Function : Independent/Modified Independent ?  ?  ?  ?  ?  ?  ?Mobility Comments: independent with RW but doesn't always use it. uses cane occasionally in home. ?ADLs Comments: independent ?  ? ? ?Hand Dominance  ? Dominant Hand: Right ? ?  ?Extremity/Trunk Assessment  ? Upper Extremity Assessment ?Upper Extremity Assessment: Defer to OT evaluation ?  ? ?Lower Extremity Assessment ?Lower Extremity Assessment: Generalized weakness ?  ? ?Cervical / Trunk Assessment ?Cervical / Trunk Assessment: Kyphotic  ?Communication  ? Communication: No difficulties  ?Cognition Arousal/Alertness: Awake/alert ?Behavior During Therapy: [employer] for tasks assessed/performed ?Overall Cognitive Status: Within Functional Limits for tasks assessed ?  ?  ?  ?  ?  ?  ?  ?  ?  ?  ?  ?  ?  ?  ?  ?  ?  ?  ?  ? ?  ?General Comments   ? ?  ?Exercises    ? ?Assessment/Plan  ?  ?PT Assessment Patient needs continued PT services  ?PT Problem List Decreased strength;Decreased range of motion;Decreased activity tolerance;Decreased balance;Decreased mobility;Decreased knowledge of use of DME;Decreased safety awareness;Decreased knowledge of precautions ? ?   ?  ?PT Treatment Interventions DME instruction;Gait training;Stair training;Functional mobility training;Therapeutic activities;Therapeutic exercise;Balance training;Neuromuscular re-education;Patient/family education   ? ?PT Goals (Current goals can be found in the Care Plan section)  ?Acute Rehab PT Goals ?Patient Stated Goal: get back home and set up 24/7 assist ?PT Goal Formulation: With patient ?Time For Goal Achievement:  03/09/22 ?Potential to Achieve Goals: Good ? ?  ?Frequency Min 3X/week ?  ? ? ?Co-evaluation   ?  ?  ?  ?  ? ? ?  ?AM-PAC PT "6 Clicks" Mobility  ?Outcome Measure Help needed turning from your back to your side while in a flat bed without using bedrails?: A Little ?Help needed moving from lying on your back to sitting on the side of a flat bed without using bedrails?: A Little ?Help needed moving to and from a bed to a chair (including a wheelchair)?: A Little ?Help needed standing up from a chair using your arms (e.g., wheelchair or bedside chair)?: A Little ?Help needed to walk in hospital room?: A Little ?Help needed climbing 3-5 steps with a railing? : A Lot ?6 Click Score: 17 ? ?  ?End of Session Equipment Utilized During Treatment: Gait belt;Other (comment) (Lt shoulder sling) ?Activity Tolerance: Patient tolerated treatment well ?Patient left: in chair;with chair alarm set;with call bell/phone within reach ?Nurse Communication: Mobility status ?PT Visit Diagnosis: Unsteadiness on feet (R26.81);Muscle weakness (generalized) (M62.81);History of falling (Z91.81);Difficulty in walking, not elsewhere classified (R26.2) ?  ? ?Time: 8099-8338 ?PT Time Calculation (min) (ACUTE ONLY): 22 min ? ? ?Charges:   PT Evaluation ?$PT Eval Low Complexity: 1 Low ?  ?  ?   ? ? ?Gwynneth Albright PT, DPT ?Acute Rehabilitation Services ?Office [phone] ?Pager [phone]  ? ?Jacques Navy ?03/02/2022, 12:38 PM ? ?

## 2022-03-02 NOTE — Progress Notes (Signed)
Occupational Therapy Evaluation ? ?Patient is an 88 year old female s/p shoulder replacement without functional use of left non-dominant upper extremity secondary to effects of surgery and interscalene block and shoulder precautions. Therapist provided education and instruction to patient and son in regards to exercises, precautions, positioning, donning upper extremity clothing and bathing while maintaining shoulder precautions, ice and edema management and donning/doffing sling. Patient and son verbalized understanding and demonstrated as needed. Patient needed assistance to don underwear, pants, socks and and provided with instruction on compensatory strategies to perform ADLs. Patient to follow up with MD for further therapy needs.  ? ? ? 03/02/22 1300  ?OT Visit Information  ?Last OT Received On 03/02/22  ?Assistance Needed +1  ?History of Present Illness Patient is a 88 year old female had a fall at home resulting in left femoral neck fracture. S/p left reverse total shoulder arthroplasty 4/13. PMH: anemia, OA, CKD, DM, HLD,  HTN  ?Precautions  ?Precautions Fall;Shoulder  ?Type of Shoulder Precautions NWB, Sling at all times except ADL/exercise AROM elbow, wrist and hand to tolerance, No PROM/AROM of shoulder  ?Shoulder Interventions Shoulder sling/immobilizer;Off for dressing/bathing/exercises  ?Required Braces or Orthoses Sling  ?Restrictions  ?Weight Bearing Restrictions Yes  ?LUE Weight Bearing NWB  ?Home Living  ?Family/patient expects to be discharged to: Private residence  ?Living Arrangements Alone  ?Available Help at Discharge Family;Available PRN/intermittently  ?Type of Home House  ?Home Access Ramped entrance  ?Home Layout One level  ?Bathroom Shower/Tub Walk-in shower  ?Bathroom Toilet Handicapped height  ?Bathroom Accessibility Yes  ?Home Equipment Shower seat;Grab bars - tub/shower  ?Additional Comments son calls every evening and neighbors come in  ?Prior Function  ?Prior Level of Function   Independent/Modified Independent  ?Mobility Comments independent with RW but doesn't always use it. uses cane occasionally in home.  ?ADLs Comments independent  ?Communication  ?Communication No difficulties  ?Pain Assessment  ?Pain Assessment No/denies pain  ?Cognition  ?Arousal/Alertness Awake/alert  ?Behavior During Therapy [facility] for tasks assessed/performed  ?Overall Cognitive Status Within Functional Limits for tasks assessed  ?General Comments Patient reports some memory deficits  ?Upper Extremity Assessment  ?Upper Extremity Assessment LUE deficits/detail  ?LUE Deficits / Details + nerve block. Able to perform fist pumps with L hand  ?Lower Extremity Assessment  ?Lower Extremity Assessment Defer to PT evaluation  ?ADL  ?Overall ADL's  Needs assistance/impaired  ?Eating/Feeding Independent  ?Grooming Wash/dry hands;Supervision/safety;Standing  ?Upper Body Bathing Minimal assistance;Sitting  ?Lower Body Bathing Minimal assistance;Sitting/lateral leans;Sit to/from stand  ?Upper Body Dressing Details (indicate cue type and reason) Patient needing blood transfusion therefore unable to complete UB dressing task at this time. Patient is able to verbalize understanding for sequencing of donning/doffing shirt as she was doing this at home prior to surgery after she fell. Anticipate patient needing moderate assistance due to nerve block. Notified RN to assist patient with UB dressing when blood transfusion complete and ready for D/C.  ?Lower Body Dressing Minimal assistance;Sit to/from stand;Sitting/lateral leans  ?Lower Body Dressing Details (indicate cue type and reason) With increased time patient is able to don brief and pants, needs min A to pull up over her L hip  ?Surveyor, minerals guard;Ambulation;Regular Toilet  ?Toilet Transfer Details (indicate cue type and reason) Patient pushes IV pole for steadying assistance with R UE when ambulating to/from bathroom. Min G for safety as patient mildly unsteady   ?Toileting- Clothing Manipulation and Hygiene Minimal assistance;Sit to/from stand;Sitting/lateral lean  ?Toileting - Clothing Manipulation Details (indicate cue  type and reason) To pull clothing up over L hip  ?Functional mobility during ADLs Min guard ?(IV pole)  ?General ADL Comments Patient and son educated in shoulder precautions and how to maintain during self care tasks.  ?Bed Mobility  ?General bed mobility comments In recliner  ?Balance  ?Overall balance assessment Mild deficits observed, not formally tested  ?Exercises  ?Exercises Shoulder  ?Shoulder Instructions  ?Donning/doffing shirt without moving shoulder Patient able to independently direct caregiver;Moderate assistance  ?Method for sponge bathing under operated UE Minimal assistance;Patient able to independently direct caregiver  ?Donning/doffing sling/immobilizer Patient able to independently direct caregiver  ?Correct positioning of sling/immobilizer Patient able to independently direct caregiver  ?ROM for elbow, wrist and digits of operated UE Patient able to independently direct caregiver  ?Sling wearing schedule (on at all times/off for ADL's) Patient able to independently direct caregiver  ?Proper positioning of operated UE when showering Patient able to independently direct caregiver  ?Positioning of UE while sleeping Patient able to independently direct caregiver  ?OT - End of Session  ?Equipment Utilized During Treatment Other (comment) ?(sling)  ?Activity Tolerance Patient tolerated treatment well  ?Patient left in chair;with call bell/phone within reach;with nursing/sitter in room  ?Nurse Communication Mobility status  ?OT Assessment  ?OT Recommendation/Assessment Progress rehab of shoulder as ordered by MD at follow-up appointment  ?OT Visit Diagnosis Pain  ?Pain - Right/Left Left  ?Pain - part of body Shoulder  ?OT Problem List Pain;Impaired UE functional use;Decreased knowledge of precautions  ?AM-PAC OT "6 Clicks" Daily Activity  Outcome Measure (Version 2)  ?Help from another person eating meals? 4  ?Help from another person taking care of personal grooming? 3  ?Help from another person toileting, which includes using toliet, bedpan, or urinal? 3  ?Help from another person bathing (including washing, rinsing, drying)? 3  ?Help from another person to put on and taking off regular upper body clothing? 2  ?Help from another person to put on and taking off regular lower body clothing? 3  ?6 Click Score 18  ?Progressive Mobility  ?What is the highest level of mobility based on the progressive mobility assessment? Level 5 (Walks with assist in room/hall) - Balance while stepping forward/back and can walk in room with assist - Complete  ?Activity Ambulated with assistance to bathroom  ?OT Recommendation  ?Follow Up Recommendations Follow physician's recommendations for discharge plan and follow up therapies  ?Assistance recommended at discharge Frequent or constant Supervision/Assistance  ?Patient can return home with the following A little help with walking and/or transfers;A little help with bathing/dressing/bathroom;Assistance with cooking/housework;Help with stairs or ramp for entrance;Assist for transportation  ?Functional Status Assessent Patient has had a recent decline in their functional status and demonstrates the ability to make significant improvements in function in a reasonable and predictable amount of time.  ?OT Equipment None recommended by OT  ?Acute Rehab OT Goals  ?Patient Stated Goal Home with hired caregiver assistance  ?OT Goal Formulation All assessment and education complete, DC therapy  ?OT Time Calculation  ?OT Start Time (ACUTE ONLY) 1023  ?OT Stop Time (ACUTE ONLY) 1055  ?OT Time Calculation (min) 32 min  ?OT General Charges  ?$OT Visit 1 Visit  ?OT Evaluation  ?$OT Eval Low Complexity 1 Low  ?OT Treatments  ?$Self Care/Home Management  8-22 mins  ?Written Expression  ?Dominant Hand Right  ? ?Delbert Phenix OT ?OT pager:  [phone] ? ?

## 2022-03-02 NOTE — Progress Notes (Signed)
PATIENT ID: Alisha [name]  MRN: [id number]  DOB/AGE:  June 07, 1932 / 88 y.o.  ?1 Day Post-Op Procedure(s) (LRB): ?LEFT REVERSE SHOULDER ARTHROPLASTY (Left) ? ?Subjective: ?Pain is mild in the left shoulder.  No c/o chest pain or SOB. Denies lightheadedness. She reports that she is "getting feeling back" in the left arm and hand.  ?  ? ?Objective: ?Vital signs in last 24 hours: ?Temp:  [97.5 ?F (36.4 ?C)-98.2 ?F (36.8 ?C)] 97.6 ?F (36.4 ?C) (04/14 7408) ?Pulse Rate:  [65-92] 65 (04/14 0553) ?Resp:  [14-22] 16 (04/14 0553) ?BP: (93-160)/(52-81) 117/69 (04/14 0553) ?SpO2:  [89 %-100 %] 97 % (04/14 0553) ? ?Intake/Output from previous day: ?04/13 0701 - 04/14 0700 ?In: 3415.4 [P.O.:690; I.V.:2725.4] ?Out: 50 [Blood:50] ?Intake/Output this shift: ?Total I/O ?In: 26.6 [I.V.:26.6] ?Out: -  ? ?Recent Labs  ?  02/27/22 ?0912 03/02/22 ?0315  ?HGB 11.3* 7.5*  ? ?Recent Labs  ?  02/27/22 ?0912 03/02/22 ?0315  ?WBC 8.2  --   ?RBC 4.01  --   ?HCT 36.1 23.8*  ?PLT 233  --   ? ?Recent Labs  ?  02/27/22 ?0912  ?NA 139  ?K 5.1  ?CL 103  ?CO2 28  ?BUN 30*  ?CREATININE 1.58*  ?GLUCOSE 137*  ?CALCIUM 8.7*  ? ? ? ?Physical Exam: ?Neurologically intact ?Neurovascular intact ?Sensation intact distally ?Intact pulses distally ?Incision: dressing C/D/I ?Able to move fingers, hand, and wrist on th LUE; slight weakness in thumb extension still ? ?Assessment/Plan: ?1 Day Post-Op Procedure(s) (LRB): ?LEFT REVERSE SHOULDER ARTHROPLASTY (Left) ?  ?Advance diet ?Up with therapy ?D/C IV fluids after transfusion and once tolerating POs well ?Weight Bearing as Tolerated (WBAT) LUE ? ?Will plan to tranfuse 1 unit today. Continue plan to work with OT and PT. Hopeful for DC home later today if she progresses well. Follow up in office in 2 weeks.  ? ? ?Geramy Lamorte L. [place], PA-C ?03/02/2022, 7:32 AM  ? ?  ?

## 2022-03-04 LAB — TYPE AND SCREEN
ABO/RH(D): A POS
Antibody Screen: NEGATIVE
Unit division: 0

## 2022-03-04 LAB — BPAM RBC
Blood Product Expiration Date: 202305042359
ISSUE DATE / TIME: 202304141037
Unit Type and Rh: 6200

## 2022-03-08 NOTE — Discharge Summary (Signed)
Patient ID: ?Alisha [name] ?MRN: [id number] ?DOB/AGE: 20-Nov-1931 88 y.o. ? ?Admit date: 03/01/2022 ?Discharge date: 03/02/2022 ? ?Admission Diagnoses:  ?Principal Problem: ?  S/P reverse total shoulder arthroplasty, left ? ? ?Discharge Diagnoses:  ?Same ? ?Past Medical History:  ?Diagnosis Date  ? Anemia   ? takes Ferrous Sulfate daily  ? Arthritis   ? B12 deficiency   ? takes Vit B12 Q3 months  ? Blood transfusion   ? Chronic kidney disease   ? Colon polyps   ? Diabetes mellitus   ? takes Metformin and Glipizide daily  ? Hemorrhoids   ? History of blood transfusion   ? no abnormal reaction noted  ? Hyperlipidemia   ? takes Zetia daily  ? Hypertension   ? takes Diovan and Metoprolol daily  ? Hypothyroidism   ? Joint pain   ? Multiple thyroid nodules   ? takes Synthroid daily  ? Osteoporosis   ? takes Fosamax every Wed  ? Peripheral vascular disease ([place])   ? DVT  2008 with ? anticoagulation  ? Pneumonia   ? PONV (postoperative nausea and vomiting)   ? ? ?Surgeries: Procedure(s): ?LEFT REVERSE SHOULDER ARTHROPLASTY on 03/01/2022 ?  ?Consultants:  ? ?Discharged Condition: Improved ? ?Hospital Course: Alisha [name] is an 88 y.o. female who was admitted 03/01/2022 for operative treatment ofS/P reverse total shoulder arthroplasty, left. Patient has severe unremitting pain that affects sleep, daily activities, and work/hobbies. After pre-op clearance the patient was taken to the operating room on 03/01/2022 and underwent  Procedure(s): ?LEFT REVERSE SHOULDER ARTHROPLASTY.   ? ?Patient was given perioperative antibiotics:  ?Anti-infectives (From admission, onward)  ? ? Start     Dose/Rate Route Frequency Ordered Stop  ? 03/01/22 0600  ceFAZolin (ANCEF) IVPB 2g/100 mL premix       ? 2 g ?200 mL/hr over 30 Minutes Intravenous On call to O.R. 03/01/22 0539 03/01/22 0735  ? ?  ?  ? ?Patient was given sequential compression devices, early ambulation, and chemoprophylaxis to prevent DVT. ? ?Patient benefited maximally from hospital  stay. Hgb dropped to 7.5 post op day one. One unit of RBCs was transfused and Hgb improved to 9.7. ? ? ?Discharge Medications:   ?Allergies as of 03/02/2022   ? ?   Reactions  ? Ace Inhibitors Cough  ? Codeine Nausea Only  ? Evista [raloxifene Hydrochloride] Other (See Comments)  ? cramps  ? Zocor [simvastatin - High Dose] Other (See Comments)  ? Cramps   ? ?  ? ?  ?Medication List  ?  ? ?STOP taking these medications   ? ?ferrous sulfate 325 (65 FE) MG tablet ?  ?senna-docusate 8.6-50 MG tablet ?Commonly known as: Senokot-S ?  ?sitaGLIPtin 50 MG tablet ?Commonly known as: Januvia ?  ? ?  ? ?TAKE these medications   ? ?acetaminophen 325 MG tablet ?Commonly known as: TYLENOL ?Take 2 tablets (650 mg total) by mouth every 6 (six) hours as needed for mild pain or moderate pain. ?  ?amLODipine 5 MG tablet ?Commonly known as: NORVASC ?Take 1 tablet (5 mg total) by mouth daily. ?  ?aspirin EC 81 MG tablet ?Take 1 tablet (81 mg total) by mouth daily. ?  ?CALTRATE 600 PO ?Take 600 mg by mouth daily. ?  ?carvedilol 25 MG tablet ?Commonly known as: COREG ?Take 1 tablet (25 mg total) by mouth 2 (two) times daily with a meal. ?  ?cholecalciferol 25 MCG (1000 UNIT) tablet ?Commonly known as: VITAMIN D3 ?Take  1,000 Units by mouth daily. ?  ?cyanocobalamin 1000 MCG/ML injection ?Commonly known as: (VITAMIN B-12) ?Inject 1,000 mcg into the muscle every 30 (thirty) days. ?  ?gabapentin 100 MG capsule ?Commonly known as: NEURONTIN ?Take 300 mg by mouth at bedtime. ?  ?glipiZIDE 5 MG 24 hr tablet ?Commonly known as: GLUCOTROL XL ?Take 5 mg by mouth daily before breakfast. ?  ?isosorbide mononitrate 30 MG 24 hr tablet ?Commonly known as: IMDUR ?Take 1 tablet (30 mg total) by mouth daily. ?  ?levothyroxine 100 MCG tablet ?Commonly known as: SYNTHROID ?Take 100 mcg by mouth every morning. ?  ?loratadine 10 MG tablet ?Commonly known as: CLARITIN ?Take 10 mg by mouth daily. ?  ?methocarbamol 500 MG tablet ?Commonly known as: ROBAXIN ?Take 1  tablet (500 mg total) by mouth every 8 (eight) hours as needed for muscle spasms. ?What changed: when to take this ?  ?multivitamin with minerals Tabs tablet ?Take 1 tablet by mouth every morning. ?  ?oxyCODONE-acetaminophen 5-325 MG tablet ?Commonly known as: Percocet ?Take 1 tablet by mouth every 6 (six) hours as needed. ?  ?Semglee (yfgn) 100 UNIT/ML Pen ?Generic drug: insulin glargine-yfgn ?Inject 8 Units into the skin at bedtime. ?  ?UNABLE TO FIND ?174.9 Right Mastectomy  ? ?L8000- Post Surgical Bras-6 ? ?T4196- Silicone Breast Prosthesis-1 ?  ?Zetia 10 MG tablet ?Generic drug: ezetimibe ?Take 10 mg by mouth daily before breakfast. ?  ? ?  ? ?  ?  ? ? ?  ?Discharge Care Instructions  ?(From admission, onward)  ?  ? ? ?  ? ?  Start     Ordered  ? 03/02/22 0000  Non weight bearing       ?Question Answer Comment  ?Laterality left   ?Extremity Upper   ?  ? 03/02/22 0739  ? ?  ?  ? ?  ? ? ?Diagnostic Studies: DG Chest 2 View ? ?Result Date: 02/22/2022 ?CLINICAL DATA:  Fall onto left side. EXAM: CHEST - 2 VIEW COMPARISON:  Chest x-ray 05/11/2014. FINDINGS: Examination is limited secondary to arm positioning. There is no focal lung infiltrate, pleural effusion or pneumothorax identified. There are surgical clips in the right axilla and lower neck. The aorta is ectatic. Cardiac silhouette is within normal limits. There is a displaced left humeral neck fracture. IMPRESSION: 1. Displaced left humeral neck fracture. 2. No other acute cardiopulmonary process. Electronically Signed   By: Ronney Asters M.D.   On: 02/22/2022 23:36  ? ?CT Head Wo Contrast ? ?Result Date: 02/22/2022 ?CLINICAL DATA:  Recent fall with headaches and neck pain, initial encounter EXAM: CT HEAD WITHOUT CONTRAST CT CERVICAL SPINE WITHOUT CONTRAST TECHNIQUE: Multidetector CT imaging of the head and cervical spine was performed following the standard protocol without intravenous contrast. Multiplanar CT image reconstructions of the cervical spine were also  generated. RADIATION DOSE REDUCTION: This exam was performed according to the departmental dose-optimization program which includes automated exposure control, adjustment of the mA and/or kV according to patient size and/or use of iterative reconstruction technique. COMPARISON:  06/10/2018 FINDINGS: CT HEAD FINDINGS Brain: No evidence of acute infarction, hemorrhage, hydrocephalus, extra-axial collection or mass lesion/mass effect. Chronic atrophic and ischemic changes are noted. Vascular: No hyperdense vessel or unexpected calcification. Skull: Normal. Negative for fracture or focal lesion. Sinuses/Orbits: No acute finding. Other: None. CT CERVICAL SPINE FINDINGS Alignment: Within normal limits. Skull base and vertebrae: In cervical segments are well visualized. Multilevel osteophytic changes are noted as well as facet hypertrophic changes throughout  the cervical spine. No compression deformity is noted. The odontoid is within normal limits. Soft tissues and spinal canal: Surrounding soft tissue structures show changes of prior thyroidectomy. Upper chest: Visualized lung apices are within normal limits. Other: None IMPRESSION: CT of the head: Chronic atrophic and ischemic changes without acute abnormality. CT of the cervical spine: Degenerative change without acute bony abnormality. Electronically Signed   By: Inez Catalina M.D.   On: 02/22/2022 23:54  ? ?CT Cervical Spine Wo Contrast ? ?Result Date: 02/22/2022 ?CLINICAL DATA:  Recent fall with headaches and neck pain, initial encounter EXAM: CT HEAD WITHOUT CONTRAST CT CERVICAL SPINE WITHOUT CONTRAST TECHNIQUE: Multidetector CT imaging of the head and cervical spine was performed following the standard protocol without intravenous contrast. Multiplanar CT image reconstructions of the cervical spine were also generated. RADIATION DOSE REDUCTION: This exam was performed according to the departmental dose-optimization program which includes automated exposure control,  adjustment of the mA and/or kV according to patient size and/or use of iterative reconstruction technique. COMPARISON:  06/10/2018 FINDINGS: CT HEAD FINDINGS Brain: No evidence of acute infarction, hemor

## 2022-03-20 DIAGNOSIS — D509 Iron deficiency anemia, unspecified: Secondary | ICD-10-CM | POA: Diagnosis not present

## 2022-03-20 DIAGNOSIS — D510 Vitamin B12 deficiency anemia due to intrinsic factor deficiency: Secondary | ICD-10-CM | POA: Diagnosis not present

## 2022-04-03 ENCOUNTER — Other Ambulatory Visit (HOSPITAL_COMMUNITY): Payer: Self-pay | Admitting: *Deleted

## 2022-04-04 ENCOUNTER — Encounter (HOSPITAL_COMMUNITY)
Admission: RE | Admit: 2022-04-04 | Discharge: 2022-04-04 | Disposition: A | Discharge status: Home / Self Care | Payer: Medicare Other | Source: Ambulatory Visit | Attending: Gastroenterology | Admitting: Gastroenterology

## 2022-04-04 DIAGNOSIS — D500 Iron deficiency anemia secondary to blood loss (chronic): Secondary | ICD-10-CM | POA: Insufficient documentation

## 2022-04-04 MED ORDER — SODIUM CHLORIDE 0.9 % IV SOLN
510.0000 mg | INTRAVENOUS | Status: DC
  Administered 2022-04-04: 510 mg via INTRAVENOUS
  Filled 2022-04-04: qty 510

## 2022-04-09 DIAGNOSIS — Z471 Aftercare following joint replacement surgery: Secondary | ICD-10-CM | POA: Diagnosis not present

## 2022-04-09 DIAGNOSIS — Z96612 Presence of left artificial shoulder joint: Secondary | ICD-10-CM | POA: Diagnosis not present

## 2022-04-10 ENCOUNTER — Encounter (HOSPITAL_COMMUNITY): Payer: Medicare Other

## 2022-04-10 DIAGNOSIS — Z96612 Presence of left artificial shoulder joint: Secondary | ICD-10-CM | POA: Diagnosis not present

## 2022-04-10 DIAGNOSIS — S42202D Unspecified fracture of upper end of left humerus, subsequent encounter for fracture with routine healing: Secondary | ICD-10-CM | POA: Diagnosis not present

## 2022-04-10 DIAGNOSIS — Z9181 History of falling: Secondary | ICD-10-CM | POA: Diagnosis not present

## 2022-04-10 DIAGNOSIS — E119 Type 2 diabetes mellitus without complications: Secondary | ICD-10-CM | POA: Diagnosis not present

## 2022-04-10 DIAGNOSIS — I10 Essential (primary) hypertension: Secondary | ICD-10-CM | POA: Diagnosis not present

## 2022-04-10 DIAGNOSIS — R32 Unspecified urinary incontinence: Secondary | ICD-10-CM | POA: Diagnosis not present

## 2022-04-10 DIAGNOSIS — Z7982 Long term (current) use of aspirin: Secondary | ICD-10-CM | POA: Diagnosis not present

## 2022-04-10 DIAGNOSIS — D649 Anemia, unspecified: Secondary | ICD-10-CM | POA: Diagnosis not present

## 2022-04-10 DIAGNOSIS — Z794 Long term (current) use of insulin: Secondary | ICD-10-CM | POA: Diagnosis not present

## 2022-04-10 DIAGNOSIS — Z7984 Long term (current) use of oral hypoglycemic drugs: Secondary | ICD-10-CM | POA: Diagnosis not present

## 2022-04-11 ENCOUNTER — Encounter (HOSPITAL_COMMUNITY)
Admission: RE | Admit: 2022-04-11 | Discharge: 2022-04-11 | Disposition: A | Discharge status: Home / Self Care | Payer: Medicare Other | Source: Ambulatory Visit | Attending: Gastroenterology | Admitting: Gastroenterology

## 2022-04-11 DIAGNOSIS — D500 Iron deficiency anemia secondary to blood loss (chronic): Secondary | ICD-10-CM | POA: Diagnosis not present

## 2022-04-11 MED ORDER — SODIUM CHLORIDE 0.9 % IV SOLN
510.0000 mg | INTRAVENOUS | Status: DC
  Administered 2022-04-11: 510 mg via INTRAVENOUS
  Filled 2022-04-11: qty 510

## 2022-04-13 DIAGNOSIS — D649 Anemia, unspecified: Secondary | ICD-10-CM | POA: Diagnosis not present

## 2022-04-13 DIAGNOSIS — E119 Type 2 diabetes mellitus without complications: Secondary | ICD-10-CM | POA: Diagnosis not present

## 2022-04-13 DIAGNOSIS — S42202D Unspecified fracture of upper end of left humerus, subsequent encounter for fracture with routine healing: Secondary | ICD-10-CM | POA: Diagnosis not present

## 2022-04-13 DIAGNOSIS — I10 Essential (primary) hypertension: Secondary | ICD-10-CM | POA: Diagnosis not present

## 2022-04-13 DIAGNOSIS — R32 Unspecified urinary incontinence: Secondary | ICD-10-CM | POA: Diagnosis not present

## 2022-04-13 DIAGNOSIS — Z7982 Long term (current) use of aspirin: Secondary | ICD-10-CM | POA: Diagnosis not present

## 2022-04-17 DIAGNOSIS — E119 Type 2 diabetes mellitus without complications: Secondary | ICD-10-CM | POA: Diagnosis not present

## 2022-04-17 DIAGNOSIS — S42202D Unspecified fracture of upper end of left humerus, subsequent encounter for fracture with routine healing: Secondary | ICD-10-CM | POA: Diagnosis not present

## 2022-04-17 DIAGNOSIS — R32 Unspecified urinary incontinence: Secondary | ICD-10-CM | POA: Diagnosis not present

## 2022-04-17 DIAGNOSIS — I10 Essential (primary) hypertension: Secondary | ICD-10-CM | POA: Diagnosis not present

## 2022-04-17 DIAGNOSIS — Z7982 Long term (current) use of aspirin: Secondary | ICD-10-CM | POA: Diagnosis not present

## 2022-04-17 DIAGNOSIS — D649 Anemia, unspecified: Secondary | ICD-10-CM | POA: Diagnosis not present

## 2022-04-18 DIAGNOSIS — R32 Unspecified urinary incontinence: Secondary | ICD-10-CM | POA: Diagnosis not present

## 2022-04-18 DIAGNOSIS — I10 Essential (primary) hypertension: Secondary | ICD-10-CM | POA: Diagnosis not present

## 2022-04-18 DIAGNOSIS — D649 Anemia, unspecified: Secondary | ICD-10-CM | POA: Diagnosis not present

## 2022-04-18 DIAGNOSIS — E119 Type 2 diabetes mellitus without complications: Secondary | ICD-10-CM | POA: Diagnosis not present

## 2022-04-18 DIAGNOSIS — S42202D Unspecified fracture of upper end of left humerus, subsequent encounter for fracture with routine healing: Secondary | ICD-10-CM | POA: Diagnosis not present

## 2022-04-18 DIAGNOSIS — Z7982 Long term (current) use of aspirin: Secondary | ICD-10-CM | POA: Diagnosis not present

## 2022-04-20 DIAGNOSIS — D649 Anemia, unspecified: Secondary | ICD-10-CM | POA: Diagnosis not present

## 2022-04-20 DIAGNOSIS — I10 Essential (primary) hypertension: Secondary | ICD-10-CM | POA: Diagnosis not present

## 2022-04-20 DIAGNOSIS — Z7982 Long term (current) use of aspirin: Secondary | ICD-10-CM | POA: Diagnosis not present

## 2022-04-20 DIAGNOSIS — E119 Type 2 diabetes mellitus without complications: Secondary | ICD-10-CM | POA: Diagnosis not present

## 2022-04-20 DIAGNOSIS — S42202D Unspecified fracture of upper end of left humerus, subsequent encounter for fracture with routine healing: Secondary | ICD-10-CM | POA: Diagnosis not present

## 2022-04-20 DIAGNOSIS — R32 Unspecified urinary incontinence: Secondary | ICD-10-CM | POA: Diagnosis not present

## 2022-04-23 DIAGNOSIS — R32 Unspecified urinary incontinence: Secondary | ICD-10-CM | POA: Diagnosis not present

## 2022-04-23 DIAGNOSIS — D649 Anemia, unspecified: Secondary | ICD-10-CM | POA: Diagnosis not present

## 2022-04-23 DIAGNOSIS — Z7982 Long term (current) use of aspirin: Secondary | ICD-10-CM | POA: Diagnosis not present

## 2022-04-23 DIAGNOSIS — E119 Type 2 diabetes mellitus without complications: Secondary | ICD-10-CM | POA: Diagnosis not present

## 2022-04-23 DIAGNOSIS — I10 Essential (primary) hypertension: Secondary | ICD-10-CM | POA: Diagnosis not present

## 2022-04-23 DIAGNOSIS — S42202D Unspecified fracture of upper end of left humerus, subsequent encounter for fracture with routine healing: Secondary | ICD-10-CM | POA: Diagnosis not present

## 2022-04-26 DIAGNOSIS — E119 Type 2 diabetes mellitus without complications: Secondary | ICD-10-CM | POA: Diagnosis not present

## 2022-04-26 DIAGNOSIS — D649 Anemia, unspecified: Secondary | ICD-10-CM | POA: Diagnosis not present

## 2022-04-26 DIAGNOSIS — S42202D Unspecified fracture of upper end of left humerus, subsequent encounter for fracture with routine healing: Secondary | ICD-10-CM | POA: Diagnosis not present

## 2022-04-26 DIAGNOSIS — I10 Essential (primary) hypertension: Secondary | ICD-10-CM | POA: Diagnosis not present

## 2022-04-26 DIAGNOSIS — Z7982 Long term (current) use of aspirin: Secondary | ICD-10-CM | POA: Diagnosis not present

## 2022-04-26 DIAGNOSIS — R32 Unspecified urinary incontinence: Secondary | ICD-10-CM | POA: Diagnosis not present

## 2022-04-28 DIAGNOSIS — I10 Essential (primary) hypertension: Secondary | ICD-10-CM | POA: Diagnosis not present

## 2022-04-28 DIAGNOSIS — E119 Type 2 diabetes mellitus without complications: Secondary | ICD-10-CM | POA: Diagnosis not present

## 2022-04-28 DIAGNOSIS — S42202D Unspecified fracture of upper end of left humerus, subsequent encounter for fracture with routine healing: Secondary | ICD-10-CM | POA: Diagnosis not present

## 2022-04-28 DIAGNOSIS — R32 Unspecified urinary incontinence: Secondary | ICD-10-CM | POA: Diagnosis not present

## 2022-04-28 DIAGNOSIS — D649 Anemia, unspecified: Secondary | ICD-10-CM | POA: Diagnosis not present

## 2022-04-28 DIAGNOSIS — Z7982 Long term (current) use of aspirin: Secondary | ICD-10-CM | POA: Diagnosis not present

## 2022-04-30 DIAGNOSIS — E119 Type 2 diabetes mellitus without complications: Secondary | ICD-10-CM | POA: Diagnosis not present

## 2022-04-30 DIAGNOSIS — Z7982 Long term (current) use of aspirin: Secondary | ICD-10-CM | POA: Diagnosis not present

## 2022-04-30 DIAGNOSIS — R32 Unspecified urinary incontinence: Secondary | ICD-10-CM | POA: Diagnosis not present

## 2022-04-30 DIAGNOSIS — I10 Essential (primary) hypertension: Secondary | ICD-10-CM | POA: Diagnosis not present

## 2022-04-30 DIAGNOSIS — S42202D Unspecified fracture of upper end of left humerus, subsequent encounter for fracture with routine healing: Secondary | ICD-10-CM | POA: Diagnosis not present

## 2022-04-30 DIAGNOSIS — D649 Anemia, unspecified: Secondary | ICD-10-CM | POA: Diagnosis not present

## 2022-05-02 DIAGNOSIS — D649 Anemia, unspecified: Secondary | ICD-10-CM | POA: Diagnosis not present

## 2022-05-02 DIAGNOSIS — Z7982 Long term (current) use of aspirin: Secondary | ICD-10-CM | POA: Diagnosis not present

## 2022-05-02 DIAGNOSIS — E119 Type 2 diabetes mellitus without complications: Secondary | ICD-10-CM | POA: Diagnosis not present

## 2022-05-02 DIAGNOSIS — S42202D Unspecified fracture of upper end of left humerus, subsequent encounter for fracture with routine healing: Secondary | ICD-10-CM | POA: Diagnosis not present

## 2022-05-02 DIAGNOSIS — I10 Essential (primary) hypertension: Secondary | ICD-10-CM | POA: Diagnosis not present

## 2022-05-02 DIAGNOSIS — R32 Unspecified urinary incontinence: Secondary | ICD-10-CM | POA: Diagnosis not present

## 2022-05-08 DIAGNOSIS — D649 Anemia, unspecified: Secondary | ICD-10-CM | POA: Diagnosis not present

## 2022-05-08 DIAGNOSIS — I10 Essential (primary) hypertension: Secondary | ICD-10-CM | POA: Diagnosis not present

## 2022-05-08 DIAGNOSIS — E119 Type 2 diabetes mellitus without complications: Secondary | ICD-10-CM | POA: Diagnosis not present

## 2022-05-08 DIAGNOSIS — Z7982 Long term (current) use of aspirin: Secondary | ICD-10-CM | POA: Diagnosis not present

## 2022-05-08 DIAGNOSIS — S42202D Unspecified fracture of upper end of left humerus, subsequent encounter for fracture with routine healing: Secondary | ICD-10-CM | POA: Diagnosis not present

## 2022-05-08 DIAGNOSIS — R32 Unspecified urinary incontinence: Secondary | ICD-10-CM | POA: Diagnosis not present

## 2022-05-09 ENCOUNTER — Other Ambulatory Visit (HOSPITAL_BASED_OUTPATIENT_CLINIC_OR_DEPARTMENT_OTHER): Payer: Self-pay | Admitting: Orthopedic Surgery

## 2022-05-09 ENCOUNTER — Encounter (INDEPENDENT_AMBULATORY_CARE_PROVIDER_SITE_OTHER): Payer: Medicare Other

## 2022-05-09 DIAGNOSIS — M79602 Pain in left arm: Secondary | ICD-10-CM

## 2022-05-10 DIAGNOSIS — Z96612 Presence of left artificial shoulder joint: Secondary | ICD-10-CM | POA: Diagnosis not present

## 2022-05-10 DIAGNOSIS — D649 Anemia, unspecified: Secondary | ICD-10-CM | POA: Diagnosis not present

## 2022-05-10 DIAGNOSIS — Z7984 Long term (current) use of oral hypoglycemic drugs: Secondary | ICD-10-CM | POA: Diagnosis not present

## 2022-05-10 DIAGNOSIS — Z9181 History of falling: Secondary | ICD-10-CM | POA: Diagnosis not present

## 2022-05-10 DIAGNOSIS — I10 Essential (primary) hypertension: Secondary | ICD-10-CM | POA: Diagnosis not present

## 2022-05-10 DIAGNOSIS — E119 Type 2 diabetes mellitus without complications: Secondary | ICD-10-CM | POA: Diagnosis not present

## 2022-05-10 DIAGNOSIS — Z794 Long term (current) use of insulin: Secondary | ICD-10-CM | POA: Diagnosis not present

## 2022-05-10 DIAGNOSIS — Z7982 Long term (current) use of aspirin: Secondary | ICD-10-CM | POA: Diagnosis not present

## 2022-05-10 DIAGNOSIS — S42202D Unspecified fracture of upper end of left humerus, subsequent encounter for fracture with routine healing: Secondary | ICD-10-CM | POA: Diagnosis not present

## 2022-05-10 DIAGNOSIS — R32 Unspecified urinary incontinence: Secondary | ICD-10-CM | POA: Diagnosis not present

## 2022-05-11 DIAGNOSIS — E119 Type 2 diabetes mellitus without complications: Secondary | ICD-10-CM | POA: Diagnosis not present

## 2022-05-11 DIAGNOSIS — S42202D Unspecified fracture of upper end of left humerus, subsequent encounter for fracture with routine healing: Secondary | ICD-10-CM | POA: Diagnosis not present

## 2022-05-11 DIAGNOSIS — R32 Unspecified urinary incontinence: Secondary | ICD-10-CM | POA: Diagnosis not present

## 2022-05-11 DIAGNOSIS — I10 Essential (primary) hypertension: Secondary | ICD-10-CM | POA: Diagnosis not present

## 2022-05-11 DIAGNOSIS — Z7982 Long term (current) use of aspirin: Secondary | ICD-10-CM | POA: Diagnosis not present

## 2022-05-11 DIAGNOSIS — D649 Anemia, unspecified: Secondary | ICD-10-CM | POA: Diagnosis not present

## 2022-05-14 DIAGNOSIS — D649 Anemia, unspecified: Secondary | ICD-10-CM | POA: Diagnosis not present

## 2022-05-14 DIAGNOSIS — S42202D Unspecified fracture of upper end of left humerus, subsequent encounter for fracture with routine healing: Secondary | ICD-10-CM | POA: Diagnosis not present

## 2022-05-14 DIAGNOSIS — E119 Type 2 diabetes mellitus without complications: Secondary | ICD-10-CM | POA: Diagnosis not present

## 2022-05-14 DIAGNOSIS — R32 Unspecified urinary incontinence: Secondary | ICD-10-CM | POA: Diagnosis not present

## 2022-05-14 DIAGNOSIS — I10 Essential (primary) hypertension: Secondary | ICD-10-CM | POA: Diagnosis not present

## 2022-05-14 DIAGNOSIS — Z7982 Long term (current) use of aspirin: Secondary | ICD-10-CM | POA: Diagnosis not present

## 2022-05-16 DIAGNOSIS — E119 Type 2 diabetes mellitus without complications: Secondary | ICD-10-CM | POA: Diagnosis not present

## 2022-05-16 DIAGNOSIS — D649 Anemia, unspecified: Secondary | ICD-10-CM | POA: Diagnosis not present

## 2022-05-16 DIAGNOSIS — R32 Unspecified urinary incontinence: Secondary | ICD-10-CM | POA: Diagnosis not present

## 2022-05-16 DIAGNOSIS — I10 Essential (primary) hypertension: Secondary | ICD-10-CM | POA: Diagnosis not present

## 2022-05-16 DIAGNOSIS — Z7982 Long term (current) use of aspirin: Secondary | ICD-10-CM | POA: Diagnosis not present

## 2022-05-16 DIAGNOSIS — S42202D Unspecified fracture of upper end of left humerus, subsequent encounter for fracture with routine healing: Secondary | ICD-10-CM | POA: Diagnosis not present

## 2022-05-17 DIAGNOSIS — Z7982 Long term (current) use of aspirin: Secondary | ICD-10-CM | POA: Diagnosis not present

## 2022-05-17 DIAGNOSIS — E119 Type 2 diabetes mellitus without complications: Secondary | ICD-10-CM | POA: Diagnosis not present

## 2022-05-17 DIAGNOSIS — R32 Unspecified urinary incontinence: Secondary | ICD-10-CM | POA: Diagnosis not present

## 2022-05-17 DIAGNOSIS — I10 Essential (primary) hypertension: Secondary | ICD-10-CM | POA: Diagnosis not present

## 2022-05-17 DIAGNOSIS — D649 Anemia, unspecified: Secondary | ICD-10-CM | POA: Diagnosis not present

## 2022-05-17 DIAGNOSIS — S42202D Unspecified fracture of upper end of left humerus, subsequent encounter for fracture with routine healing: Secondary | ICD-10-CM | POA: Diagnosis not present

## 2022-05-21 DIAGNOSIS — S42202D Unspecified fracture of upper end of left humerus, subsequent encounter for fracture with routine healing: Secondary | ICD-10-CM | POA: Diagnosis not present

## 2022-05-21 DIAGNOSIS — D649 Anemia, unspecified: Secondary | ICD-10-CM | POA: Diagnosis not present

## 2022-05-21 DIAGNOSIS — E119 Type 2 diabetes mellitus without complications: Secondary | ICD-10-CM | POA: Diagnosis not present

## 2022-05-21 DIAGNOSIS — Z7982 Long term (current) use of aspirin: Secondary | ICD-10-CM | POA: Diagnosis not present

## 2022-05-21 DIAGNOSIS — I10 Essential (primary) hypertension: Secondary | ICD-10-CM | POA: Diagnosis not present

## 2022-05-21 DIAGNOSIS — R32 Unspecified urinary incontinence: Secondary | ICD-10-CM | POA: Diagnosis not present

## 2022-05-23 DIAGNOSIS — Z471 Aftercare following joint replacement surgery: Secondary | ICD-10-CM | POA: Diagnosis not present

## 2022-05-23 DIAGNOSIS — Z96612 Presence of left artificial shoulder joint: Secondary | ICD-10-CM | POA: Diagnosis not present

## 2022-05-24 DIAGNOSIS — Z7982 Long term (current) use of aspirin: Secondary | ICD-10-CM | POA: Diagnosis not present

## 2022-05-24 DIAGNOSIS — I10 Essential (primary) hypertension: Secondary | ICD-10-CM | POA: Diagnosis not present

## 2022-05-24 DIAGNOSIS — D649 Anemia, unspecified: Secondary | ICD-10-CM | POA: Diagnosis not present

## 2022-05-24 DIAGNOSIS — R32 Unspecified urinary incontinence: Secondary | ICD-10-CM | POA: Diagnosis not present

## 2022-05-24 DIAGNOSIS — S42202D Unspecified fracture of upper end of left humerus, subsequent encounter for fracture with routine healing: Secondary | ICD-10-CM | POA: Diagnosis not present

## 2022-05-24 DIAGNOSIS — E119 Type 2 diabetes mellitus without complications: Secondary | ICD-10-CM | POA: Diagnosis not present

## 2022-05-25 DIAGNOSIS — I10 Essential (primary) hypertension: Secondary | ICD-10-CM | POA: Diagnosis not present

## 2022-05-25 DIAGNOSIS — E119 Type 2 diabetes mellitus without complications: Secondary | ICD-10-CM | POA: Diagnosis not present

## 2022-05-25 DIAGNOSIS — R32 Unspecified urinary incontinence: Secondary | ICD-10-CM | POA: Diagnosis not present

## 2022-05-25 DIAGNOSIS — S42202D Unspecified fracture of upper end of left humerus, subsequent encounter for fracture with routine healing: Secondary | ICD-10-CM | POA: Diagnosis not present

## 2022-05-25 DIAGNOSIS — D649 Anemia, unspecified: Secondary | ICD-10-CM | POA: Diagnosis not present

## 2022-05-25 DIAGNOSIS — Z7982 Long term (current) use of aspirin: Secondary | ICD-10-CM | POA: Diagnosis not present

## 2022-05-29 DIAGNOSIS — D649 Anemia, unspecified: Secondary | ICD-10-CM | POA: Diagnosis not present

## 2022-05-29 DIAGNOSIS — R32 Unspecified urinary incontinence: Secondary | ICD-10-CM | POA: Diagnosis not present

## 2022-05-29 DIAGNOSIS — D510 Vitamin B12 deficiency anemia due to intrinsic factor deficiency: Secondary | ICD-10-CM | POA: Diagnosis not present

## 2022-05-29 DIAGNOSIS — Z7982 Long term (current) use of aspirin: Secondary | ICD-10-CM | POA: Diagnosis not present

## 2022-05-29 DIAGNOSIS — I10 Essential (primary) hypertension: Secondary | ICD-10-CM | POA: Diagnosis not present

## 2022-05-29 DIAGNOSIS — E119 Type 2 diabetes mellitus without complications: Secondary | ICD-10-CM | POA: Diagnosis not present

## 2022-05-29 DIAGNOSIS — S42202D Unspecified fracture of upper end of left humerus, subsequent encounter for fracture with routine healing: Secondary | ICD-10-CM | POA: Diagnosis not present

## 2022-05-30 DIAGNOSIS — E119 Type 2 diabetes mellitus without complications: Secondary | ICD-10-CM | POA: Diagnosis not present

## 2022-05-30 DIAGNOSIS — D649 Anemia, unspecified: Secondary | ICD-10-CM | POA: Diagnosis not present

## 2022-05-30 DIAGNOSIS — R32 Unspecified urinary incontinence: Secondary | ICD-10-CM | POA: Diagnosis not present

## 2022-05-30 DIAGNOSIS — S42202D Unspecified fracture of upper end of left humerus, subsequent encounter for fracture with routine healing: Secondary | ICD-10-CM | POA: Diagnosis not present

## 2022-05-30 DIAGNOSIS — Z7982 Long term (current) use of aspirin: Secondary | ICD-10-CM | POA: Diagnosis not present

## 2022-05-30 DIAGNOSIS — I10 Essential (primary) hypertension: Secondary | ICD-10-CM | POA: Diagnosis not present

## 2022-05-31 DIAGNOSIS — D649 Anemia, unspecified: Secondary | ICD-10-CM | POA: Diagnosis not present

## 2022-05-31 DIAGNOSIS — R32 Unspecified urinary incontinence: Secondary | ICD-10-CM | POA: Diagnosis not present

## 2022-05-31 DIAGNOSIS — I10 Essential (primary) hypertension: Secondary | ICD-10-CM | POA: Diagnosis not present

## 2022-05-31 DIAGNOSIS — E119 Type 2 diabetes mellitus without complications: Secondary | ICD-10-CM | POA: Diagnosis not present

## 2022-05-31 DIAGNOSIS — Z7982 Long term (current) use of aspirin: Secondary | ICD-10-CM | POA: Diagnosis not present

## 2022-05-31 DIAGNOSIS — S42202D Unspecified fracture of upper end of left humerus, subsequent encounter for fracture with routine healing: Secondary | ICD-10-CM | POA: Diagnosis not present

## 2022-06-04 DIAGNOSIS — I10 Essential (primary) hypertension: Secondary | ICD-10-CM | POA: Diagnosis not present

## 2022-06-04 DIAGNOSIS — D649 Anemia, unspecified: Secondary | ICD-10-CM | POA: Diagnosis not present

## 2022-06-04 DIAGNOSIS — E119 Type 2 diabetes mellitus without complications: Secondary | ICD-10-CM | POA: Diagnosis not present

## 2022-06-04 DIAGNOSIS — S42202D Unspecified fracture of upper end of left humerus, subsequent encounter for fracture with routine healing: Secondary | ICD-10-CM | POA: Diagnosis not present

## 2022-06-04 DIAGNOSIS — Z7982 Long term (current) use of aspirin: Secondary | ICD-10-CM | POA: Diagnosis not present

## 2022-06-04 DIAGNOSIS — R32 Unspecified urinary incontinence: Secondary | ICD-10-CM | POA: Diagnosis not present

## 2022-06-05 DIAGNOSIS — Z7982 Long term (current) use of aspirin: Secondary | ICD-10-CM | POA: Diagnosis not present

## 2022-06-05 DIAGNOSIS — D649 Anemia, unspecified: Secondary | ICD-10-CM | POA: Diagnosis not present

## 2022-06-05 DIAGNOSIS — E119 Type 2 diabetes mellitus without complications: Secondary | ICD-10-CM | POA: Diagnosis not present

## 2022-06-05 DIAGNOSIS — R32 Unspecified urinary incontinence: Secondary | ICD-10-CM | POA: Diagnosis not present

## 2022-06-05 DIAGNOSIS — I10 Essential (primary) hypertension: Secondary | ICD-10-CM | POA: Diagnosis not present

## 2022-06-05 DIAGNOSIS — S42202D Unspecified fracture of upper end of left humerus, subsequent encounter for fracture with routine healing: Secondary | ICD-10-CM | POA: Diagnosis not present

## 2022-06-07 DIAGNOSIS — I10 Essential (primary) hypertension: Secondary | ICD-10-CM | POA: Diagnosis not present

## 2022-06-07 DIAGNOSIS — E119 Type 2 diabetes mellitus without complications: Secondary | ICD-10-CM | POA: Diagnosis not present

## 2022-06-07 DIAGNOSIS — S42202D Unspecified fracture of upper end of left humerus, subsequent encounter for fracture with routine healing: Secondary | ICD-10-CM | POA: Diagnosis not present

## 2022-06-07 DIAGNOSIS — Z7982 Long term (current) use of aspirin: Secondary | ICD-10-CM | POA: Diagnosis not present

## 2022-06-07 DIAGNOSIS — R32 Unspecified urinary incontinence: Secondary | ICD-10-CM | POA: Diagnosis not present

## 2022-06-07 DIAGNOSIS — D649 Anemia, unspecified: Secondary | ICD-10-CM | POA: Diagnosis not present

## 2022-06-09 DIAGNOSIS — D649 Anemia, unspecified: Secondary | ICD-10-CM | POA: Diagnosis not present

## 2022-06-09 DIAGNOSIS — Z96612 Presence of left artificial shoulder joint: Secondary | ICD-10-CM | POA: Diagnosis not present

## 2022-06-09 DIAGNOSIS — Z7984 Long term (current) use of oral hypoglycemic drugs: Secondary | ICD-10-CM | POA: Diagnosis not present

## 2022-06-09 DIAGNOSIS — R32 Unspecified urinary incontinence: Secondary | ICD-10-CM | POA: Diagnosis not present

## 2022-06-09 DIAGNOSIS — S42202D Unspecified fracture of upper end of left humerus, subsequent encounter for fracture with routine healing: Secondary | ICD-10-CM | POA: Diagnosis not present

## 2022-06-09 DIAGNOSIS — Z7982 Long term (current) use of aspirin: Secondary | ICD-10-CM | POA: Diagnosis not present

## 2022-06-09 DIAGNOSIS — Z9181 History of falling: Secondary | ICD-10-CM | POA: Diagnosis not present

## 2022-06-09 DIAGNOSIS — E119 Type 2 diabetes mellitus without complications: Secondary | ICD-10-CM | POA: Diagnosis not present

## 2022-06-09 DIAGNOSIS — I10 Essential (primary) hypertension: Secondary | ICD-10-CM | POA: Diagnosis not present

## 2022-06-09 DIAGNOSIS — Z794 Long term (current) use of insulin: Secondary | ICD-10-CM | POA: Diagnosis not present

## 2022-06-11 DIAGNOSIS — E119 Type 2 diabetes mellitus without complications: Secondary | ICD-10-CM | POA: Diagnosis not present

## 2022-06-11 DIAGNOSIS — R32 Unspecified urinary incontinence: Secondary | ICD-10-CM | POA: Diagnosis not present

## 2022-06-11 DIAGNOSIS — Z7982 Long term (current) use of aspirin: Secondary | ICD-10-CM | POA: Diagnosis not present

## 2022-06-11 DIAGNOSIS — D649 Anemia, unspecified: Secondary | ICD-10-CM | POA: Diagnosis not present

## 2022-06-11 DIAGNOSIS — S42202D Unspecified fracture of upper end of left humerus, subsequent encounter for fracture with routine healing: Secondary | ICD-10-CM | POA: Diagnosis not present

## 2022-06-11 DIAGNOSIS — I10 Essential (primary) hypertension: Secondary | ICD-10-CM | POA: Diagnosis not present

## 2022-06-13 DIAGNOSIS — S42202D Unspecified fracture of upper end of left humerus, subsequent encounter for fracture with routine healing: Secondary | ICD-10-CM | POA: Diagnosis not present

## 2022-06-13 DIAGNOSIS — I10 Essential (primary) hypertension: Secondary | ICD-10-CM | POA: Diagnosis not present

## 2022-06-13 DIAGNOSIS — Z7982 Long term (current) use of aspirin: Secondary | ICD-10-CM | POA: Diagnosis not present

## 2022-06-13 DIAGNOSIS — E119 Type 2 diabetes mellitus without complications: Secondary | ICD-10-CM | POA: Diagnosis not present

## 2022-06-13 DIAGNOSIS — R32 Unspecified urinary incontinence: Secondary | ICD-10-CM | POA: Diagnosis not present

## 2022-06-13 DIAGNOSIS — D649 Anemia, unspecified: Secondary | ICD-10-CM | POA: Diagnosis not present

## 2022-06-16 DIAGNOSIS — I10 Essential (primary) hypertension: Secondary | ICD-10-CM | POA: Diagnosis not present

## 2022-06-16 DIAGNOSIS — D649 Anemia, unspecified: Secondary | ICD-10-CM | POA: Diagnosis not present

## 2022-06-16 DIAGNOSIS — Z7982 Long term (current) use of aspirin: Secondary | ICD-10-CM | POA: Diagnosis not present

## 2022-06-16 DIAGNOSIS — E119 Type 2 diabetes mellitus without complications: Secondary | ICD-10-CM | POA: Diagnosis not present

## 2022-06-16 DIAGNOSIS — S42202D Unspecified fracture of upper end of left humerus, subsequent encounter for fracture with routine healing: Secondary | ICD-10-CM | POA: Diagnosis not present

## 2022-06-16 DIAGNOSIS — R32 Unspecified urinary incontinence: Secondary | ICD-10-CM | POA: Diagnosis not present

## 2022-06-18 DIAGNOSIS — Z7982 Long term (current) use of aspirin: Secondary | ICD-10-CM | POA: Diagnosis not present

## 2022-06-18 DIAGNOSIS — R32 Unspecified urinary incontinence: Secondary | ICD-10-CM | POA: Diagnosis not present

## 2022-06-18 DIAGNOSIS — I10 Essential (primary) hypertension: Secondary | ICD-10-CM | POA: Diagnosis not present

## 2022-06-18 DIAGNOSIS — D649 Anemia, unspecified: Secondary | ICD-10-CM | POA: Diagnosis not present

## 2022-06-18 DIAGNOSIS — E119 Type 2 diabetes mellitus without complications: Secondary | ICD-10-CM | POA: Diagnosis not present

## 2022-06-18 DIAGNOSIS — S42202D Unspecified fracture of upper end of left humerus, subsequent encounter for fracture with routine healing: Secondary | ICD-10-CM | POA: Diagnosis not present

## 2022-06-20 DIAGNOSIS — I10 Essential (primary) hypertension: Secondary | ICD-10-CM | POA: Diagnosis not present

## 2022-06-20 DIAGNOSIS — D509 Iron deficiency anemia, unspecified: Secondary | ICD-10-CM | POA: Diagnosis not present

## 2022-06-20 DIAGNOSIS — Z7982 Long term (current) use of aspirin: Secondary | ICD-10-CM | POA: Diagnosis not present

## 2022-06-20 DIAGNOSIS — E119 Type 2 diabetes mellitus without complications: Secondary | ICD-10-CM | POA: Diagnosis not present

## 2022-06-20 DIAGNOSIS — R32 Unspecified urinary incontinence: Secondary | ICD-10-CM | POA: Diagnosis not present

## 2022-06-20 DIAGNOSIS — S42202D Unspecified fracture of upper end of left humerus, subsequent encounter for fracture with routine healing: Secondary | ICD-10-CM | POA: Diagnosis not present

## 2022-06-20 DIAGNOSIS — D649 Anemia, unspecified: Secondary | ICD-10-CM | POA: Diagnosis not present

## 2022-06-23 DIAGNOSIS — E119 Type 2 diabetes mellitus without complications: Secondary | ICD-10-CM | POA: Diagnosis not present

## 2022-06-23 DIAGNOSIS — D649 Anemia, unspecified: Secondary | ICD-10-CM | POA: Diagnosis not present

## 2022-06-23 DIAGNOSIS — S42202D Unspecified fracture of upper end of left humerus, subsequent encounter for fracture with routine healing: Secondary | ICD-10-CM | POA: Diagnosis not present

## 2022-06-23 DIAGNOSIS — R32 Unspecified urinary incontinence: Secondary | ICD-10-CM | POA: Diagnosis not present

## 2022-06-23 DIAGNOSIS — Z7982 Long term (current) use of aspirin: Secondary | ICD-10-CM | POA: Diagnosis not present

## 2022-06-23 DIAGNOSIS — I10 Essential (primary) hypertension: Secondary | ICD-10-CM | POA: Diagnosis not present

## 2022-06-25 DIAGNOSIS — S42202D Unspecified fracture of upper end of left humerus, subsequent encounter for fracture with routine healing: Secondary | ICD-10-CM | POA: Diagnosis not present

## 2022-06-25 DIAGNOSIS — E119 Type 2 diabetes mellitus without complications: Secondary | ICD-10-CM | POA: Diagnosis not present

## 2022-06-25 DIAGNOSIS — Z7982 Long term (current) use of aspirin: Secondary | ICD-10-CM | POA: Diagnosis not present

## 2022-06-25 DIAGNOSIS — D649 Anemia, unspecified: Secondary | ICD-10-CM | POA: Diagnosis not present

## 2022-06-25 DIAGNOSIS — I10 Essential (primary) hypertension: Secondary | ICD-10-CM | POA: Diagnosis not present

## 2022-06-25 DIAGNOSIS — R32 Unspecified urinary incontinence: Secondary | ICD-10-CM | POA: Diagnosis not present

## 2022-06-28 DIAGNOSIS — E119 Type 2 diabetes mellitus without complications: Secondary | ICD-10-CM | POA: Diagnosis not present

## 2022-06-28 DIAGNOSIS — Z7982 Long term (current) use of aspirin: Secondary | ICD-10-CM | POA: Diagnosis not present

## 2022-06-28 DIAGNOSIS — R32 Unspecified urinary incontinence: Secondary | ICD-10-CM | POA: Diagnosis not present

## 2022-06-28 DIAGNOSIS — D649 Anemia, unspecified: Secondary | ICD-10-CM | POA: Diagnosis not present

## 2022-06-28 DIAGNOSIS — S42202D Unspecified fracture of upper end of left humerus, subsequent encounter for fracture with routine healing: Secondary | ICD-10-CM | POA: Diagnosis not present

## 2022-06-28 DIAGNOSIS — I10 Essential (primary) hypertension: Secondary | ICD-10-CM | POA: Diagnosis not present

## 2022-07-03 DIAGNOSIS — S42202D Unspecified fracture of upper end of left humerus, subsequent encounter for fracture with routine healing: Secondary | ICD-10-CM | POA: Diagnosis not present

## 2022-07-03 DIAGNOSIS — D649 Anemia, unspecified: Secondary | ICD-10-CM | POA: Diagnosis not present

## 2022-07-03 DIAGNOSIS — I10 Essential (primary) hypertension: Secondary | ICD-10-CM | POA: Diagnosis not present

## 2022-07-03 DIAGNOSIS — R32 Unspecified urinary incontinence: Secondary | ICD-10-CM | POA: Diagnosis not present

## 2022-07-03 DIAGNOSIS — Z7982 Long term (current) use of aspirin: Secondary | ICD-10-CM | POA: Diagnosis not present

## 2022-07-03 DIAGNOSIS — E119 Type 2 diabetes mellitus without complications: Secondary | ICD-10-CM | POA: Diagnosis not present

## 2022-07-06 DIAGNOSIS — S42202D Unspecified fracture of upper end of left humerus, subsequent encounter for fracture with routine healing: Secondary | ICD-10-CM | POA: Diagnosis not present

## 2022-07-06 DIAGNOSIS — Z7982 Long term (current) use of aspirin: Secondary | ICD-10-CM | POA: Diagnosis not present

## 2022-07-06 DIAGNOSIS — I10 Essential (primary) hypertension: Secondary | ICD-10-CM | POA: Diagnosis not present

## 2022-07-06 DIAGNOSIS — D649 Anemia, unspecified: Secondary | ICD-10-CM | POA: Diagnosis not present

## 2022-07-06 DIAGNOSIS — R32 Unspecified urinary incontinence: Secondary | ICD-10-CM | POA: Diagnosis not present

## 2022-07-06 DIAGNOSIS — E119 Type 2 diabetes mellitus without complications: Secondary | ICD-10-CM | POA: Diagnosis not present

## 2022-07-09 DIAGNOSIS — Z7982 Long term (current) use of aspirin: Secondary | ICD-10-CM | POA: Diagnosis not present

## 2022-07-09 DIAGNOSIS — R32 Unspecified urinary incontinence: Secondary | ICD-10-CM | POA: Diagnosis not present

## 2022-07-09 DIAGNOSIS — Z96612 Presence of left artificial shoulder joint: Secondary | ICD-10-CM | POA: Diagnosis not present

## 2022-07-09 DIAGNOSIS — Z7984 Long term (current) use of oral hypoglycemic drugs: Secondary | ICD-10-CM | POA: Diagnosis not present

## 2022-07-09 DIAGNOSIS — Z794 Long term (current) use of insulin: Secondary | ICD-10-CM | POA: Diagnosis not present

## 2022-07-09 DIAGNOSIS — Z9181 History of falling: Secondary | ICD-10-CM | POA: Diagnosis not present

## 2022-07-09 DIAGNOSIS — E119 Type 2 diabetes mellitus without complications: Secondary | ICD-10-CM | POA: Diagnosis not present

## 2022-07-09 DIAGNOSIS — I10 Essential (primary) hypertension: Secondary | ICD-10-CM | POA: Diagnosis not present

## 2022-07-09 DIAGNOSIS — D649 Anemia, unspecified: Secondary | ICD-10-CM | POA: Diagnosis not present

## 2022-07-09 DIAGNOSIS — S42202D Unspecified fracture of upper end of left humerus, subsequent encounter for fracture with routine healing: Secondary | ICD-10-CM | POA: Diagnosis not present

## 2022-07-11 DIAGNOSIS — Z7982 Long term (current) use of aspirin: Secondary | ICD-10-CM | POA: Diagnosis not present

## 2022-07-11 DIAGNOSIS — R32 Unspecified urinary incontinence: Secondary | ICD-10-CM | POA: Diagnosis not present

## 2022-07-11 DIAGNOSIS — E119 Type 2 diabetes mellitus without complications: Secondary | ICD-10-CM | POA: Diagnosis not present

## 2022-07-11 DIAGNOSIS — I10 Essential (primary) hypertension: Secondary | ICD-10-CM | POA: Diagnosis not present

## 2022-07-11 DIAGNOSIS — S42202D Unspecified fracture of upper end of left humerus, subsequent encounter for fracture with routine healing: Secondary | ICD-10-CM | POA: Diagnosis not present

## 2022-07-11 DIAGNOSIS — D649 Anemia, unspecified: Secondary | ICD-10-CM | POA: Diagnosis not present

## 2022-07-16 DIAGNOSIS — S42202D Unspecified fracture of upper end of left humerus, subsequent encounter for fracture with routine healing: Secondary | ICD-10-CM | POA: Diagnosis not present

## 2022-07-16 DIAGNOSIS — E119 Type 2 diabetes mellitus without complications: Secondary | ICD-10-CM | POA: Diagnosis not present

## 2022-07-16 DIAGNOSIS — R32 Unspecified urinary incontinence: Secondary | ICD-10-CM | POA: Diagnosis not present

## 2022-07-16 DIAGNOSIS — D649 Anemia, unspecified: Secondary | ICD-10-CM | POA: Diagnosis not present

## 2022-07-16 DIAGNOSIS — I10 Essential (primary) hypertension: Secondary | ICD-10-CM | POA: Diagnosis not present

## 2022-07-16 DIAGNOSIS — Z7982 Long term (current) use of aspirin: Secondary | ICD-10-CM | POA: Diagnosis not present

## 2022-07-18 DIAGNOSIS — R32 Unspecified urinary incontinence: Secondary | ICD-10-CM | POA: Diagnosis not present

## 2022-07-18 DIAGNOSIS — I10 Essential (primary) hypertension: Secondary | ICD-10-CM | POA: Diagnosis not present

## 2022-07-18 DIAGNOSIS — D649 Anemia, unspecified: Secondary | ICD-10-CM | POA: Diagnosis not present

## 2022-07-18 DIAGNOSIS — E119 Type 2 diabetes mellitus without complications: Secondary | ICD-10-CM | POA: Diagnosis not present

## 2022-07-18 DIAGNOSIS — Z7982 Long term (current) use of aspirin: Secondary | ICD-10-CM | POA: Diagnosis not present

## 2022-07-18 DIAGNOSIS — S42202D Unspecified fracture of upper end of left humerus, subsequent encounter for fracture with routine healing: Secondary | ICD-10-CM | POA: Diagnosis not present

## 2022-07-24 DIAGNOSIS — E1122 Type 2 diabetes mellitus with diabetic chronic kidney disease: Secondary | ICD-10-CM | POA: Diagnosis not present

## 2022-07-24 DIAGNOSIS — I129 Hypertensive chronic kidney disease with stage 1 through stage 4 chronic kidney disease, or unspecified chronic kidney disease: Secondary | ICD-10-CM | POA: Diagnosis not present

## 2022-07-24 DIAGNOSIS — D631 Anemia in chronic kidney disease: Secondary | ICD-10-CM | POA: Diagnosis not present

## 2022-07-24 DIAGNOSIS — N1832 Chronic kidney disease, stage 3b: Secondary | ICD-10-CM | POA: Diagnosis not present

## 2022-07-27 DIAGNOSIS — Z7982 Long term (current) use of aspirin: Secondary | ICD-10-CM | POA: Diagnosis not present

## 2022-07-27 DIAGNOSIS — R32 Unspecified urinary incontinence: Secondary | ICD-10-CM | POA: Diagnosis not present

## 2022-07-27 DIAGNOSIS — S42202D Unspecified fracture of upper end of left humerus, subsequent encounter for fracture with routine healing: Secondary | ICD-10-CM | POA: Diagnosis not present

## 2022-07-27 DIAGNOSIS — I10 Essential (primary) hypertension: Secondary | ICD-10-CM | POA: Diagnosis not present

## 2022-07-27 DIAGNOSIS — E119 Type 2 diabetes mellitus without complications: Secondary | ICD-10-CM | POA: Diagnosis not present

## 2022-07-27 DIAGNOSIS — D649 Anemia, unspecified: Secondary | ICD-10-CM | POA: Diagnosis not present

## 2022-07-30 DIAGNOSIS — I10 Essential (primary) hypertension: Secondary | ICD-10-CM | POA: Diagnosis not present

## 2022-07-30 DIAGNOSIS — E119 Type 2 diabetes mellitus without complications: Secondary | ICD-10-CM | POA: Diagnosis not present

## 2022-07-30 DIAGNOSIS — S42202D Unspecified fracture of upper end of left humerus, subsequent encounter for fracture with routine healing: Secondary | ICD-10-CM | POA: Diagnosis not present

## 2022-07-30 DIAGNOSIS — Z7982 Long term (current) use of aspirin: Secondary | ICD-10-CM | POA: Diagnosis not present

## 2022-07-30 DIAGNOSIS — D649 Anemia, unspecified: Secondary | ICD-10-CM | POA: Diagnosis not present

## 2022-07-30 DIAGNOSIS — R32 Unspecified urinary incontinence: Secondary | ICD-10-CM | POA: Diagnosis not present

## 2022-07-31 DIAGNOSIS — D510 Vitamin B12 deficiency anemia due to intrinsic factor deficiency: Secondary | ICD-10-CM | POA: Diagnosis not present

## 2022-08-15 ENCOUNTER — Other Ambulatory Visit: Payer: Self-pay | Admitting: Orthopedic Surgery

## 2022-08-15 DIAGNOSIS — M25512 Pain in left shoulder: Secondary | ICD-10-CM

## 2022-08-17 ENCOUNTER — Ambulatory Visit
Admission: RE | Admit: 2022-08-17 | Discharge: 2022-08-17 | Disposition: A | Discharge status: Home / Self Care | Payer: Medicare Other | Source: Ambulatory Visit | Attending: Orthopedic Surgery | Admitting: Orthopedic Surgery

## 2022-08-17 ENCOUNTER — Other Ambulatory Visit: Payer: Self-pay | Admitting: Orthopedic Surgery

## 2022-08-17 DIAGNOSIS — S42125A Nondisplaced fracture of acromial process, left shoulder, initial encounter for closed fracture: Secondary | ICD-10-CM | POA: Diagnosis not present

## 2022-08-17 DIAGNOSIS — M25512 Pain in left shoulder: Secondary | ICD-10-CM

## 2022-08-17 DIAGNOSIS — Z96642 Presence of left artificial hip joint: Secondary | ICD-10-CM | POA: Diagnosis not present

## 2022-08-20 DIAGNOSIS — M25512 Pain in left shoulder: Secondary | ICD-10-CM | POA: Diagnosis not present

## 2022-09-18 DIAGNOSIS — Z23 Encounter for immunization: Secondary | ICD-10-CM | POA: Diagnosis not present

## 2022-09-24 DIAGNOSIS — M25512 Pain in left shoulder: Secondary | ICD-10-CM | POA: Diagnosis not present

## 2022-09-26 DIAGNOSIS — D509 Iron deficiency anemia, unspecified: Secondary | ICD-10-CM | POA: Diagnosis not present

## 2022-10-01 ENCOUNTER — Other Ambulatory Visit (HOSPITAL_COMMUNITY): Payer: Self-pay | Admitting: *Deleted

## 2022-10-01 DIAGNOSIS — E1121 Type 2 diabetes mellitus with diabetic nephropathy: Secondary | ICD-10-CM | POA: Diagnosis not present

## 2022-10-01 DIAGNOSIS — I5030 Unspecified diastolic (congestive) heart failure: Secondary | ICD-10-CM | POA: Diagnosis not present

## 2022-10-01 DIAGNOSIS — E7800 Pure hypercholesterolemia, unspecified: Secondary | ICD-10-CM | POA: Diagnosis not present

## 2022-10-01 DIAGNOSIS — D0510 Intraductal carcinoma in situ of unspecified breast: Secondary | ICD-10-CM | POA: Diagnosis not present

## 2022-10-01 DIAGNOSIS — D510 Vitamin B12 deficiency anemia due to intrinsic factor deficiency: Secondary | ICD-10-CM | POA: Diagnosis not present

## 2022-10-01 DIAGNOSIS — I129 Hypertensive chronic kidney disease with stage 1 through stage 4 chronic kidney disease, or unspecified chronic kidney disease: Secondary | ICD-10-CM | POA: Diagnosis not present

## 2022-10-01 DIAGNOSIS — M5430 Sciatica, unspecified side: Secondary | ICD-10-CM | POA: Diagnosis not present

## 2022-10-01 DIAGNOSIS — E042 Nontoxic multinodular goiter: Secondary | ICD-10-CM | POA: Diagnosis not present

## 2022-10-01 DIAGNOSIS — Z0000 Encounter for general adult medical examination without abnormal findings: Secondary | ICD-10-CM | POA: Diagnosis not present

## 2022-10-01 DIAGNOSIS — N1832 Chronic kidney disease, stage 3b: Secondary | ICD-10-CM | POA: Diagnosis not present

## 2022-10-01 DIAGNOSIS — E039 Hypothyroidism, unspecified: Secondary | ICD-10-CM | POA: Diagnosis not present

## 2022-10-01 DIAGNOSIS — M810 Age-related osteoporosis without current pathological fracture: Secondary | ICD-10-CM | POA: Diagnosis not present

## 2022-10-03 ENCOUNTER — Encounter (HOSPITAL_COMMUNITY)
Admission: RE | Admit: 2022-10-03 | Discharge: 2022-10-03 | Disposition: A | Discharge status: Home / Self Care | Payer: Medicare Other | Source: Ambulatory Visit | Attending: Gastroenterology | Admitting: Gastroenterology

## 2022-10-03 DIAGNOSIS — D500 Iron deficiency anemia secondary to blood loss (chronic): Secondary | ICD-10-CM | POA: Insufficient documentation

## 2022-10-03 MED ORDER — SODIUM CHLORIDE 0.9 % IV SOLN
510.0000 mg | INTRAVENOUS | Status: DC
  Administered 2022-10-03: 510 mg via INTRAVENOUS
  Filled 2022-10-03: qty 510

## 2022-10-07 ENCOUNTER — Emergency Department (HOSPITAL_COMMUNITY): Payer: Medicare Other

## 2022-10-07 ENCOUNTER — Emergency Department (HOSPITAL_COMMUNITY)
Admission: EM | Admit: 2022-10-07 | Discharge: 2022-10-07 | Disposition: A | Discharge status: Home / Self Care | Payer: Medicare Other | Attending: Emergency Medicine | Admitting: Emergency Medicine

## 2022-10-07 ENCOUNTER — Other Ambulatory Visit: Payer: Self-pay

## 2022-10-07 ENCOUNTER — Encounter (HOSPITAL_COMMUNITY): Payer: Self-pay

## 2022-10-07 DIAGNOSIS — M5441 Lumbago with sciatica, right side: Secondary | ICD-10-CM | POA: Diagnosis not present

## 2022-10-07 DIAGNOSIS — S32048A Other fracture of fourth lumbar vertebra, initial encounter for closed fracture: Secondary | ICD-10-CM

## 2022-10-07 DIAGNOSIS — I129 Hypertensive chronic kidney disease with stage 1 through stage 4 chronic kidney disease, or unspecified chronic kidney disease: Secondary | ICD-10-CM | POA: Insufficient documentation

## 2022-10-07 DIAGNOSIS — Z794 Long term (current) use of insulin: Secondary | ICD-10-CM | POA: Diagnosis not present

## 2022-10-07 DIAGNOSIS — Z7984 Long term (current) use of oral hypoglycemic drugs: Secondary | ICD-10-CM | POA: Insufficient documentation

## 2022-10-07 DIAGNOSIS — Z79899 Other long term (current) drug therapy: Secondary | ICD-10-CM | POA: Insufficient documentation

## 2022-10-07 DIAGNOSIS — E119 Type 2 diabetes mellitus without complications: Secondary | ICD-10-CM | POA: Insufficient documentation

## 2022-10-07 DIAGNOSIS — E039 Hypothyroidism, unspecified: Secondary | ICD-10-CM | POA: Diagnosis not present

## 2022-10-07 DIAGNOSIS — Z7982 Long term (current) use of aspirin: Secondary | ICD-10-CM | POA: Insufficient documentation

## 2022-10-07 DIAGNOSIS — S32049A Unspecified fracture of fourth lumbar vertebra, initial encounter for closed fracture: Secondary | ICD-10-CM | POA: Diagnosis not present

## 2022-10-07 DIAGNOSIS — N189 Chronic kidney disease, unspecified: Secondary | ICD-10-CM | POA: Diagnosis not present

## 2022-10-07 DIAGNOSIS — M545 Low back pain, unspecified: Secondary | ICD-10-CM | POA: Diagnosis not present

## 2022-10-07 MED ORDER — PREDNISONE 20 MG PO TABS
60.0000 mg | ORAL_TABLET | Freq: Once | ORAL | Status: AC
Start: 2022-10-07 — End: 2022-10-07
  Administered 2022-10-07: 60 mg via ORAL
  Filled 2022-10-07: qty 3

## 2022-10-07 MED ORDER — PREDNISONE 10 MG PO TABS: 40.0000 mg | ORAL_TABLET | Freq: Every day | ORAL | 0 refills | Status: DC

## 2022-10-07 MED ORDER — ACETAMINOPHEN 325 MG PO TABS
650.0000 mg | ORAL_TABLET | Freq: Once | ORAL | Status: AC
  Administered 2022-10-07: 650 mg via ORAL
  Filled 2022-10-07: qty 2

## 2022-10-07 MED ORDER — HYDROCODONE-ACETAMINOPHEN 5-325 MG PO TABS
1.0000 | ORAL_TABLET | Freq: Once | ORAL | Status: DC
  Filled 2022-10-07: qty 1

## 2022-10-07 NOTE — ED Provider Notes (Signed)
[facility] DEPT Provider Note   CSN: [id number] Arrival date & time: 10/07/22  7829     History  Chief Complaint  Patient presents with   Back Pain    Alisha [name] is a 88 y.o. female.  With a history of arthritis, peripheral vascular disease, hypertension, anemia, hypothyroidism, hemorrhoids, CKD who presents ED for evaluation of bilateral low back pain with right worse than left.  Patient present for approximately 1 week.  No preceding injury.  Patient does not have exactly when the pain started.  States walking helps, but twisting and bending makes it worse.  He lying semirecumbent also makes the pain worse.  Patient tried Tylenol at home for the pain which she states does help.  Pain is described as an ache with sharp shooting pain down bilateral legs with right worse than left.  Rates the pain at an 8 out of 10 at rest.  States she has baseline urinary leakage, but this has not worsened.  Denies fecal incontinence or constipation.  Has had 2 normal bowel movements already today.  Does state that it hurts more than usual when she has bowel movements and she attributes this to her hemorrhoids.  Denies saddle paresthesias.  Does have peripheral neuropathy, but denies increasing numbness or tingling in the extremities.  Patient is ambulatory.   Back Pain      Home Medications Prior to Admission medications   Medication Sig Start Date End Date Taking? Authorizing Provider  acetaminophen (TYLENOL) 325 MG tablet Take 2 tablets (650 mg total) by mouth every 6 (six) hours as needed for mild pain or moderate pain. 11/20/19   Donne Hazel, MD  amLODipine (NORVASC) 5 MG tablet Take 1 tablet (5 mg total) by mouth daily. 11/20/19 11/19/20  Donne Hazel, MD  aspirin EC 81 MG tablet Take 1 tablet (81 mg total) by mouth daily. Patient not taking: Reported on 02/26/2022 06/10/18   Jola Schmidt, MD  Calcium Carbonate (CALTRATE 600 PO) Take 600 mg by mouth daily.     [provider]  carvedilol (COREG) 25 MG tablet Take 1 tablet (25 mg total) by mouth 2 (two) times daily with a meal. 06/16/18   Florencia Reasons, MD  cholecalciferol (VITAMIN D3) 25 MCG (1000 UNIT) tablet Take 1,000 Units by mouth daily.    [provider]  cyanocobalamin (,VITAMIN B-12,) 1000 MCG/ML injection Inject 1,000 mcg into the muscle every 30 (thirty) days.    [provider]  gabapentin (NEURONTIN) 100 MG capsule Take 300 mg by mouth at bedtime.    [provider]  glipiZIDE (GLUCOTROL XL) 5 MG 24 hr tablet Take 5 mg by mouth daily before breakfast. 02/11/22   [provider]  isosorbide mononitrate (IMDUR) 30 MG 24 hr tablet Take 1 tablet (30 mg total) by mouth daily. 06/17/18   Florencia Reasons, MD  levothyroxine (SYNTHROID) 100 MCG tablet Take 100 mcg by mouth every morning. 11/02/19   [provider]  loratadine (CLARITIN) 10 MG tablet Take 10 mg by mouth daily.    [provider]  methocarbamol (ROBAXIN) 500 MG tablet Take 1 tablet (500 mg total) by mouth every 8 (eight) hours as needed for muscle spasms. 03/02/22   Porterfield, Museum/gallery conservator, PA-C  Multiple Vitamin (MULTIVITAMIN WITH MINERALS) TABS tablet Take 1 tablet by mouth every morning.    [provider]  oxyCODONE-acetaminophen (PERCOCET) 5-325 MG tablet Take 1 tablet by mouth every 6 (six) hours as needed. 03/02/22 03/02/23  Porterfield, Amber, PA-C  SEMGLEE, YFGN, 100 UNIT/ML Pen Inject 8 Units into the skin at bedtime. 10/31/21   [provider]  UNABLE TO FIND 174.9 Right Mastectomy   L8000- Post Surgical Bras-6  H3716- Silicone Breast Prosthesis-1 07/21/14   Stark Klein, MD  ZETIA 10 MG tablet Take 10 mg by mouth daily before breakfast.  09/28/11   [provider]      Allergies    Ace inhibitors, Codeine, Evista [raloxifene hydrochloride], and Zocor [simvastatin - high dose]    Review of Systems   Review of Systems  Musculoskeletal:  Positive for back  pain.  All other systems reviewed and are negative.   Physical Exam Updated Vital Signs BP (!) 182/81 (BP Location: Right Arm)   Pulse 70   Temp 97.7 F (36.5 C) (Oral)   Resp 16   SpO2 100%  Physical Exam Vitals and nursing note reviewed.  Constitutional:      General: She is not in acute distress.    Appearance: Normal appearance. She is normal weight. She is not ill-appearing.  HENT:     Head: Normocephalic and atraumatic.  Pulmonary:     Effort: Pulmonary effort is normal. No respiratory distress.  Abdominal:     General: Abdomen is flat.  Musculoskeletal:     Cervical back: Normal and neck supple.     Thoracic back: Normal.     Lumbar back: No deformity, signs of trauma, tenderness or bony tenderness. Decreased range of motion. Positive right straight leg raise test. Negative left straight leg raise test.  Skin:    General: Skin is warm and dry.  Neurological:     Mental Status: She is alert and oriented to person, place, and time.  Psychiatric:        Mood and Affect: Mood normal.        Behavior: Behavior normal.     ED Results / Procedures / Treatments   Labs (all labs ordered are listed, but only abnormal results are displayed) Labs Reviewed - No data to display  EKG None  Radiology No results found.  Procedures Procedures    Medications Ordered in ED Medications  HYDROcodone-acetaminophen (NORCO/VICODIN) 5-325 MG per tablet 1 tablet (has no administration in time range)  predniSONE (DELTASONE) tablet 60 mg (has no administration in time range)    ED Course/ Medical Decision Making/ A&P                           Medical Decision Making Amount and/or Complexity of Data Reviewed Radiology: ordered.  Risk Prescription drug management.  This patient presents to the ED for concern of back pain, this involves an extensive number of treatment options, and is a complaint that carries with it a high risk of complications and morbidity.   The  emergent differential diagnosis for back pain includes but is not limited to fracture, muscle strain, cauda equina, spinal stenosis. DDD, ankylosing spondylitis, acute ligamentous injury, disk herniation, spondylolisthesis, Epidural compression syndrome, metastatic cancer, transverse myelitis, vertebral osteomyelitis, diskitis, kidney stone, pyelonephritis, AAA, Perforated ulcer, Retrocecal appendicitis, pancreatitis, bowel obstruction, retroperitoneal hemorrhage or mass, meningitis.    Co morbidities that complicate the patient evaluation   arthritis, hypertension, anemia, osteoporosis, hypothyroidism, CKD, diabetes  My initial workup includes pain control, prednisone, CT lumbar spine  Additional history obtained from: Nursing notes from this visit. Family son provides personal history  I ordered imaging studies including CT lumbar spine I independently visualized  and interpreted imaging which showed age-indeterminate fracture of L4 superior endplate, chronic degenerative changes I agree with the radiologist interpretation   Afebrile, hemodynamically stable 88 year old female patient presenting to the ED for evaluation of nontraumatic low back pain.  Patient does not have symptoms of cord compression including fevers, chills, saddle paresthesias, urinary or fecal incontinence.  Physical exam remarkable for positive straight leg raise on the right.  Pain is reproducible with forward flexion and twisting.  CT does show age-indeterminate L4 superior endplate fracture, but is otherwise reassuring.  Patient was treated with prednisone and Tylenol.  Was educated on the use of Tylenol at home for the pain.  Will be sent a prescription for a prednisone burst.  Patient was offered orthopedic follow-up, but states she would rather see her primary orthopedic provider.  She will call them on Monday to schedule an appointment for an ED follow-up.  Gave patient strict return precautions.  Stable at  discharge.  At this time there does not appear to be any evidence of an acute emergency medical condition and the patient appears stable for discharge with appropriate outpatient follow up. Diagnosis was discussed with patient who verbalizes understanding of care plan and is agreeable to discharge. I have discussed return precautions with patient and son who verbalizes understanding. Patient encouraged to follow-up with their PCP within 1 week. All questions answered.  Patient's case discussed with Dr. Ashok Cordia who agrees with plan to discharge with follow-up.   Note: Portions of this report may have been transcribed using voice recognition software. Every effort was made to ensure accuracy; however, inadvertent computerized transcription errors may still be present.          Final Clinical Impression(s) / ED Diagnoses Final diagnoses:  None    Rx / DC Orders ED Discharge Orders     None         Roylene Reason, Hershal Coria 10/07/22 1109    Lajean Saver, MD 10/07/22 1419

## 2022-10-07 NOTE — Discharge Instructions (Addendum)
You have been seen today for your complaint of low back pain. Your imaging showed that you have an age indeterminate fracture of you L4 vertebra, was otherwise reassuring. Your discharge medications include prednisone. This is a steroid. You should take it in the morning with food. Home care instructions are as follows:  Move slowly, always use a walker or cane when walking. Try to always have someone around you when you walk. Follow up with: your primary orthopedic doctor. Call them tomorrow to schedule an appointment for an ED follow up. Please seek immediate medical care if you develop any of the following symptoms: You are not able to control when you urinate or have bowel movements (incontinence). You have: Weakness in your lower back, pelvis, buttocks, or legs that gets worse. Redness or swelling of your back. A burning sensation when you urinate. At this time there does not appear to be the presence of an emergent medical condition, however there is always the potential for conditions to change. Please read and follow the below instructions.  Do not take your medicine if  develop an itchy rash, swelling in your mouth or lips, or difficulty breathing; call 911 and seek immediate emergency medical attention if this occurs.  You may review your lab tests and imaging results in their entirety on your MyChart account.  Please discuss all results of fully with your primary care provider and other specialist at your follow-up visit.  Note: Portions of this text may have been transcribed using voice recognition software. Every effort was made to ensure accuracy; however, inadvertent computerized transcription errors may still be present.

## 2022-10-07 NOTE — ED Triage Notes (Signed)
C/o lower back pain radiating to bilateral legs, right leg greater then left x 4 days and worse when constipated.  LBM yesterday that was hard.  Denies fall.

## 2022-10-08 DIAGNOSIS — M545 Low back pain, unspecified: Secondary | ICD-10-CM | POA: Diagnosis not present

## 2022-10-10 ENCOUNTER — Encounter (HOSPITAL_COMMUNITY)
Admission: RE | Admit: 2022-10-10 | Discharge: 2022-10-10 | Disposition: A | Discharge status: Home / Self Care | Payer: Medicare Other | Source: Ambulatory Visit | Attending: Gastroenterology | Admitting: Gastroenterology

## 2022-10-10 DIAGNOSIS — D500 Iron deficiency anemia secondary to blood loss (chronic): Secondary | ICD-10-CM | POA: Diagnosis not present

## 2022-10-10 MED ORDER — SODIUM CHLORIDE 0.9 % IV SOLN
510.0000 mg | INTRAVENOUS | Status: AC
  Administered 2022-10-10: 510 mg via INTRAVENOUS
  Filled 2022-10-10: qty 510

## 2022-10-16 ENCOUNTER — Telehealth: Payer: Self-pay

## 2022-10-16 NOTE — Telephone Encounter (Signed)
     Patient  visit on 11/19  at [facility]  Have you been able to follow up with your primary care physician? Yes   The patient was or was not able to obtain any needed medicine or equipment. Yes   Are there diet recommendations that you are having difficulty following? Na   Patient expresses understanding of discharge instructions and education provided has no other needs at this time.  Yes      [place], [facility], Care Management  [phone] [address], [place], [place] [address] Phone: [phone] Email: [email or web address]

## 2022-11-05 DIAGNOSIS — M25512 Pain in left shoulder: Secondary | ICD-10-CM | POA: Diagnosis not present

## 2022-11-26 DIAGNOSIS — D510 Vitamin B12 deficiency anemia due to intrinsic factor deficiency: Secondary | ICD-10-CM | POA: Diagnosis not present

## 2022-12-24 DIAGNOSIS — D510 Vitamin B12 deficiency anemia due to intrinsic factor deficiency: Secondary | ICD-10-CM | POA: Diagnosis not present

## 2023-01-15 DIAGNOSIS — D509 Iron deficiency anemia, unspecified: Secondary | ICD-10-CM | POA: Diagnosis not present

## 2023-01-21 DIAGNOSIS — D510 Vitamin B12 deficiency anemia due to intrinsic factor deficiency: Secondary | ICD-10-CM | POA: Diagnosis not present

## 2023-02-27 DIAGNOSIS — D510 Vitamin B12 deficiency anemia due to intrinsic factor deficiency: Secondary | ICD-10-CM | POA: Diagnosis not present

## 2023-03-18 DIAGNOSIS — M25512 Pain in left shoulder: Secondary | ICD-10-CM | POA: Diagnosis not present

## 2023-04-08 DIAGNOSIS — I5030 Unspecified diastolic (congestive) heart failure: Secondary | ICD-10-CM | POA: Diagnosis not present

## 2023-04-08 DIAGNOSIS — N1832 Chronic kidney disease, stage 3b: Secondary | ICD-10-CM | POA: Diagnosis not present

## 2023-04-08 DIAGNOSIS — E7800 Pure hypercholesterolemia, unspecified: Secondary | ICD-10-CM | POA: Diagnosis not present

## 2023-04-08 DIAGNOSIS — Z23 Encounter for immunization: Secondary | ICD-10-CM | POA: Diagnosis not present

## 2023-04-08 DIAGNOSIS — D485 Neoplasm of uncertain behavior of skin: Secondary | ICD-10-CM | POA: Diagnosis not present

## 2023-04-08 DIAGNOSIS — I129 Hypertensive chronic kidney disease with stage 1 through stage 4 chronic kidney disease, or unspecified chronic kidney disease: Secondary | ICD-10-CM | POA: Diagnosis not present

## 2023-04-08 DIAGNOSIS — G629 Polyneuropathy, unspecified: Secondary | ICD-10-CM | POA: Diagnosis not present

## 2023-04-08 DIAGNOSIS — E039 Hypothyroidism, unspecified: Secondary | ICD-10-CM | POA: Diagnosis not present

## 2023-04-08 DIAGNOSIS — D500 Iron deficiency anemia secondary to blood loss (chronic): Secondary | ICD-10-CM | POA: Diagnosis not present

## 2023-04-08 DIAGNOSIS — E1121 Type 2 diabetes mellitus with diabetic nephropathy: Secondary | ICD-10-CM | POA: Diagnosis not present

## 2023-04-08 DIAGNOSIS — D510 Vitamin B12 deficiency anemia due to intrinsic factor deficiency: Secondary | ICD-10-CM | POA: Diagnosis not present

## 2023-04-23 DIAGNOSIS — C44399 Other specified malignant neoplasm of skin of other parts of face: Secondary | ICD-10-CM | POA: Diagnosis not present

## 2023-04-23 DIAGNOSIS — D485 Neoplasm of uncertain behavior of skin: Secondary | ICD-10-CM | POA: Diagnosis not present

## 2023-04-23 DIAGNOSIS — C44729 Squamous cell carcinoma of skin of left lower limb, including hip: Secondary | ICD-10-CM | POA: Diagnosis not present

## 2023-04-23 DIAGNOSIS — Z85828 Personal history of other malignant neoplasm of skin: Secondary | ICD-10-CM | POA: Diagnosis not present

## 2023-04-23 DIAGNOSIS — L821 Other seborrheic keratosis: Secondary | ICD-10-CM | POA: Diagnosis not present

## 2023-05-02 DIAGNOSIS — C44399 Other specified malignant neoplasm of skin of other parts of face: Secondary | ICD-10-CM | POA: Diagnosis not present

## 2023-05-02 DIAGNOSIS — D485 Neoplasm of uncertain behavior of skin: Secondary | ICD-10-CM | POA: Diagnosis not present

## 2023-05-09 DIAGNOSIS — I129 Hypertensive chronic kidney disease with stage 1 through stage 4 chronic kidney disease, or unspecified chronic kidney disease: Secondary | ICD-10-CM | POA: Diagnosis not present

## 2023-05-09 DIAGNOSIS — C4499 Other specified malignant neoplasm of skin, unspecified: Secondary | ICD-10-CM | POA: Diagnosis not present

## 2023-05-09 DIAGNOSIS — G629 Polyneuropathy, unspecified: Secondary | ICD-10-CM | POA: Diagnosis not present

## 2023-05-14 ENCOUNTER — Ambulatory Visit: Payer: Medicare Other | Admitting: Physician Assistant

## 2023-05-15 ENCOUNTER — Inpatient Hospital Stay: Payer: Medicare Other | Attending: Physician Assistant | Admitting: Physician Assistant

## 2023-05-15 ENCOUNTER — Inpatient Hospital Stay: Payer: Medicare Other

## 2023-05-15 ENCOUNTER — Encounter: Payer: Self-pay | Admitting: Physician Assistant

## 2023-05-15 VITALS — BP 131/55 | HR 62 | Temp 98.0°F | Resp 18 | Ht 61.0 in | Wt 150.6 lb

## 2023-05-15 DIAGNOSIS — C792 Secondary malignant neoplasm of skin: Secondary | ICD-10-CM | POA: Diagnosis not present

## 2023-05-15 DIAGNOSIS — Z809 Family history of malignant neoplasm, unspecified: Secondary | ICD-10-CM | POA: Insufficient documentation

## 2023-05-15 DIAGNOSIS — E039 Hypothyroidism, unspecified: Secondary | ICD-10-CM | POA: Insufficient documentation

## 2023-05-15 DIAGNOSIS — R978 Other abnormal tumor markers: Secondary | ICD-10-CM

## 2023-05-15 DIAGNOSIS — E1122 Type 2 diabetes mellitus with diabetic chronic kidney disease: Secondary | ICD-10-CM | POA: Diagnosis not present

## 2023-05-15 DIAGNOSIS — Z79899 Other long term (current) drug therapy: Secondary | ICD-10-CM | POA: Insufficient documentation

## 2023-05-15 DIAGNOSIS — Z86000 Personal history of in-situ neoplasm of breast: Secondary | ICD-10-CM | POA: Diagnosis not present

## 2023-05-15 DIAGNOSIS — D509 Iron deficiency anemia, unspecified: Secondary | ICD-10-CM | POA: Diagnosis not present

## 2023-05-15 DIAGNOSIS — C801 Malignant (primary) neoplasm, unspecified: Secondary | ICD-10-CM | POA: Insufficient documentation

## 2023-05-15 DIAGNOSIS — Z9011 Acquired absence of right breast and nipple: Secondary | ICD-10-CM

## 2023-05-15 DIAGNOSIS — C799 Secondary malignant neoplasm of unspecified site: Secondary | ICD-10-CM

## 2023-05-15 DIAGNOSIS — I129 Hypertensive chronic kidney disease with stage 1 through stage 4 chronic kidney disease, or unspecified chronic kidney disease: Secondary | ICD-10-CM | POA: Diagnosis not present

## 2023-05-15 DIAGNOSIS — N189 Chronic kidney disease, unspecified: Secondary | ICD-10-CM | POA: Diagnosis not present

## 2023-05-15 DIAGNOSIS — E785 Hyperlipidemia, unspecified: Secondary | ICD-10-CM | POA: Diagnosis not present

## 2023-05-15 DIAGNOSIS — E1159 Type 2 diabetes mellitus with other circulatory complications: Secondary | ICD-10-CM | POA: Diagnosis not present

## 2023-05-15 LAB — CBC WITH DIFFERENTIAL (CANCER CENTER ONLY)
Abs Immature Granulocytes: 0.02 10*3/uL (ref 0.00–0.07)
Basophils Absolute: 0.1 10*3/uL (ref 0.0–0.1)
Basophils Relative: 1 %
Eosinophils Absolute: 0.2 10*3/uL (ref 0.0–0.5)
Eosinophils Relative: 3 %
HCT: 35 % — ABNORMAL LOW (ref 36.0–46.0)
Hemoglobin: 10.7 g/dL — ABNORMAL LOW (ref 12.0–15.0)
Immature Granulocytes: 0 %
Lymphocytes Relative: 17 %
Lymphs Abs: 1.3 10*3/uL (ref 0.7–4.0)
MCH: 25.1 pg — ABNORMAL LOW (ref 26.0–34.0)
MCHC: 30.6 g/dL (ref 30.0–36.0)
MCV: 82.2 fL (ref 80.0–100.0)
Monocytes Absolute: 0.6 10*3/uL (ref 0.1–1.0)
Monocytes Relative: 8 %
Neutro Abs: 5.5 10*3/uL (ref 1.7–7.7)
Neutrophils Relative %: 71 %
Platelet Count: 270 10*3/uL (ref 150–400)
RBC: 4.26 MIL/uL (ref 3.87–5.11)
RDW: 15.9 % — ABNORMAL HIGH (ref 11.5–15.5)
WBC Count: 7.7 10*3/uL (ref 4.0–10.5)
nRBC: 0 % (ref 0.0–0.2)

## 2023-05-15 LAB — CMP (CANCER CENTER ONLY)
ALT: 13 U/L (ref 0–44)
AST: 15 U/L (ref 15–41)
Albumin: 3.8 g/dL (ref 3.5–5.0)
Alkaline Phosphatase: 56 U/L (ref 38–126)
Anion gap: 6 (ref 5–15)
BUN: 35 mg/dL — ABNORMAL HIGH (ref 8–23)
CO2: 35 mmol/L — ABNORMAL HIGH (ref 22–32)
Calcium: 9.4 mg/dL (ref 8.9–10.3)
Chloride: 100 mmol/L (ref 98–111)
Creatinine: 1.81 mg/dL — ABNORMAL HIGH (ref 0.44–1.00)
GFR, Estimated: 26 mL/min — ABNORMAL LOW (ref >60)
Glucose, Bld: 97 mg/dL (ref 70–99)
Potassium: 4.4 mmol/L (ref 3.5–5.1)
Sodium: 141 mmol/L (ref 135–145)
Total Bilirubin: 0.4 mg/dL (ref 0.3–1.2)
Total Protein: 7.5 g/dL (ref 6.5–8.1)

## 2023-05-15 LAB — RETIC PANEL
Immature Retic Fract: 11.9 % (ref 2.3–15.9)
RBC.: 4.26 MIL/uL (ref 3.87–5.11)
Retic Count, Absolute: 40.5 10*3/uL (ref 19.0–186.0)
Retic Ct Pct: 1 % (ref 0.4–3.1)
Reticulocyte Hemoglobin: 29.2 pg (ref >27.9)

## 2023-05-15 LAB — IRON AND IRON BINDING CAPACITY (CC-WL,HP ONLY)
Iron: 31 ug/dL (ref 28–170)
Saturation Ratios: 8 % — ABNORMAL LOW (ref 10.4–31.8)
TIBC: 399 ug/dL (ref 250–450)
UIBC: 368 ug/dL (ref 148–442)

## 2023-05-15 NOTE — Progress Notes (Signed)
[facility] [phone]   [phone]  INITIAL CONSULTATION:  Patient Care Team: Ollen Bowl, MD as PCP - General (Internal Medicine)  CHIEF COMPLAINTS/PURPOSE OF CONSULTATION:  Metastatic carcinoma of the skin, unknown primary  HISTORY OF PRESENTING ILLNESS:  Alisha [name] 89 y.o. female with medical history significant for right DCIS s/p right mastectomy, HTN, T2DM, hyperlipidemia, hypothyroidism and CKD. She presents to the clinic for evaluation of metastatic carcinoma involving the skin with an unknown primary. She is accompanied by her son for this visit.   On review of the previous records, Alisha [name] underwent skin biopsies of her left temple on 6/13/204 and near her right eyelid on 04/23/2023. 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   On exam today, Alisha [name] reports that she is feeling well without any new or concerning symptoms. She denies any changes to her energy or appetite. She denies any weight loss. She denies any palpable lumps or masses including around her left breast or axillary regions. She adds that due to a shoulder injury last year, she was unable to undergo her screening mammogram last year or this year. She denies fevers, chills, sweats, shortness of breath, chest pain, cough, easy bruising/bleeding, headaches or dizziness. She has no other complaints. Rest of the ROS is below.   MEDICAL HISTORY:  Past Medical History:  Diagnosis Date   Anemia    takes Ferrous Sulfate daily   Arthritis    B12 deficiency    takes Vit B12 Q3 months   Blood transfusion    Chronic kidney disease    Colon polyps    Diabetes mellitus    takes Metformin and Glipizide daily   Hemorrhoids    History of blood transfusion    no abnormal reaction noted   Hyperlipidemia    takes Zetia daily   Hypertension    takes Diovan and Metoprolol daily   Hypothyroidism    Joint  pain    Multiple thyroid nodules    takes Synthroid daily   Osteoporosis    takes Fosamax every Wed   Peripheral vascular disease (HCC)    DVT  2008 with ? anticoagulation   Pneumonia    PONV (postoperative nausea and vomiting)     SURGICAL HISTORY: Past Surgical History:  Procedure Laterality Date   COLONOSCOPY WITH PROPOFOL N/A 12/06/2014   Procedure: COLONOSCOPY WITH PROPOFOL;  Surgeon: Charolett Bumpers, MD;  Location: [facility];  Service: Endoscopy;  Laterality: N/A;   ESOPHAGOGASTRODUODENOSCOPY (EGD) WITH PROPOFOL N/A 12/06/2014   Procedure: ESOPHAGOGASTRODUODENOSCOPY (EGD) WITH PROPOFOL;  Surgeon: Charolett Bumpers, MD;  Location: [facility];  Service: Endoscopy;  Laterality: N/A;   FEMUR SURGERY Left 07/08/11   plates and screws   FRACTURE SURGERY     8/12 femur fx with rod and bolt placed   HEMORRHOIDECTOMY WITH HEMORRHOID BANDING     HERNIA REPAIR  1945   IR ANGIO EXTRACRAN SEL COM CAROTID INNOMINATE UNI BILAT MOD SED  06/13/2018   IR ANGIO VERTEBRAL SEL SUBCLAVIAN INNOMINATE UNI R MOD SED  06/13/2018   IR ANGIO VERTEBRAL SEL VERTEBRAL UNI L MOD SED  06/13/2018   MASTECTOMY W/ SENTINEL NODE BIOPSY Right 05/13/2014   Procedure: MASTECTOMY WITH SENTINEL LYMPH NODE BIOPSY;  Surgeon: Almond Lint, MD;  Location: [facility];  Service: General;  Laterality: Right;   REVERSE SHOULDER ARTHROPLASTY Left 03/01/2022   Procedure: LEFT REVERSE SHOULDER ARTHROPLASTY;  Surgeon: Jones Broom, MD;  Location: [facility];  Service: Orthopedics;  Laterality: Left;   THYROIDECTOMY  12/04/2011   Procedure: THYROIDECTOMY;  Surgeon: Velora Heckler, MD;  Location: [facility];  Service: General;  Laterality: N/A;  total thyroidectomy    SOCIAL HISTORY: Social History   Socioeconomic History   Marital status: Widowed    Spouse name: Not on file   Number of children: 1   Years of education: 12   Highest education level: High school graduate  Occupational History   Occupation: Retired  Tobacco Use    Smoking status: Never   Smokeless tobacco: Never  Vaping Use   Vaping Use: Never used  Substance and Sexual Activity   Alcohol use: No   Drug use: No   Sexual activity: Never    Birth control/protection: Post-menopausal  Other Topics Concern   Not on file  Social History Narrative   Lives at home alone.   Right-handed.   2 cups caffeine per day, occasional soda.   Social Determinants of Health   Financial Resource Strain: Not on file  Food Insecurity: Not on file  Transportation Needs: Not on file  Physical Activity: Not on file  Stress: Not on file  Social Connections: Not on file  Intimate Partner Violence: Not on file    FAMILY HISTORY: Family History  Problem Relation Age of Onset   Hypertension Mother    Hypertension Father    Cancer Brother     ALLERGIES:  is allergic to ace inhibitors, codeine, evista [raloxifene hydrochloride], and zocor [simvastatin - high dose].  MEDICATIONS:  Current Outpatient Medications  Medication Sig Dispense Refill   acetaminophen (TYLENOL) 325 MG tablet Take 2 tablets (650 mg total) by mouth every 6 (six) hours as needed for mild pain or moderate pain. 30 tablet 0   Calcium Carbonate (CALTRATE 600 PO) Take 600 mg by mouth daily.     carvedilol (COREG) 25 MG tablet Take 1 tablet (25 mg total) by mouth 2 (two) times daily with a meal. 60 tablet 0   gabapentin (NEURONTIN) 100 MG capsule Take 300 mg by mouth at bedtime.     glipiZIDE (GLUCOTROL XL) 5 MG 24 hr tablet Take 5 mg by mouth daily before breakfast.     isosorbide mononitrate (IMDUR) 30 MG 24 hr tablet Take 1 tablet (30 mg total) by mouth daily. 30 tablet 0   levothyroxine (SYNTHROID) 100 MCG tablet Take 100 mcg by mouth every morning.     loratadine (CLARITIN) 10 MG tablet Take 10 mg by mouth daily.     Multiple Vitamins-Minerals (CENTRUM SILVER PO) Take by mouth.     SEMGLEE, YFGN, 100 UNIT/ML Pen Inject 8 Units into the skin at bedtime.     UNABLE TO FIND 174.9 Right  Mastectomy   L8000- Post Surgical Bras-6  L8030- Silicone Breast Prosthesis-1 1 each 0   ZETIA 10 MG tablet Take 10 mg by mouth daily before breakfast.      amLODipine (NORVASC) 5 MG tablet Take 1 tablet (5 mg total) by mouth daily. 30 tablet 0   aspirin EC 81 MG tablet Take 1 tablet (81 mg total) by mouth daily. (Patient not taking: Reported on 02/26/2022) 30 tablet 3   cholecalciferol (VITAMIN D3) 25 MCG (1000 UNIT) tablet Take 1,000 Units by mouth daily. (Patient not taking: Reported on 05/15/2023)     cyanocobalamin (,VITAMIN B-12,) 1000 MCG/ML injection Inject 1,000 mcg into the muscle every 30 (thirty) days. (Patient not taking: Reported on 05/15/2023)  methocarbamol (ROBAXIN) 500 MG tablet Take 1 tablet (500 mg total) by mouth every 8 (eight) hours as needed for muscle spasms. (Patient not taking: Reported on 05/15/2023) 20 tablet 0   Multiple Vitamin (MULTIVITAMIN WITH MINERALS) TABS tablet Take 1 tablet by mouth every morning. (Patient not taking: Reported on 05/15/2023)     predniSONE (DELTASONE) 10 MG tablet Take 4 tablets (40 mg total) by mouth daily with breakfast. (Patient not taking: Reported on 05/15/2023) 16 tablet 0   No current facility-administered medications for this visit.    REVIEW OF SYSTEMS:   Constitutional: ( - ) fevers, ( - )  chills , ( - ) night sweats Eyes: ( - ) blurriness of vision, ( - ) double vision, ( - ) watery eyes Ears, nose, mouth, throat, and face: ( - ) mucositis, ( - ) sore throat Respiratory: ( - ) cough, ( - ) dyspnea, ( - ) wheezes Cardiovascular: ( - ) palpitation, ( - ) chest discomfort, ( - ) lower extremity swelling Gastrointestinal:  ( - ) nausea, ( - ) heartburn, ( - ) change in bowel habits Skin: ( - ) abnormal skin rashes Lymphatics: ( - ) new lymphadenopathy, ( - ) easy bruising Neurological: ( - ) numbness, ( - ) tingling, ( - ) new weaknesses Behavioral/Psych: ( - ) mood change, ( - ) new changes  All other systems were reviewed with  the patient and are negative.  PHYSICAL EXAMINATION: ECOG PERFORMANCE STATUS: 0 - Asymptomatic  Vitals:   05/15/23 1406  BP: (!) 131/55  Pulse: 62  Resp: 18  Temp: 98 F (36.7 C)  SpO2: 98%   Filed Weights   05/15/23 1406  Weight: 150 lb 9.6 oz (68.3 kg)    GENERAL: elderly appearing female in NAD  SKIN: skin color, texture, turgor are normal, no rashes or significant lesions EYES: conjunctiva are pink and non-injected, sclera clear OROPHARYNX: no exudate, no erythema; lips, buccal mucosa, and tongue normal  NECK: supple, non-tender LYMPH:  no palpable lymphadenopathy in the cervical, axillary or supraclavicular lymph nodes.  BREAST: S/P right mastectomy. Right breast examined without any palpable masses or lesions. No nipple discharge. No axillary masses. LUNGS: clear to auscultation and percussion with normal breathing effort HEART: regular rate & rhythm and no murmurs. Mild bilateral ankle edema ABDOMEN: soft, non-tender, non-distended, normal bowel sounds Musculoskeletal: no cyanosis of digits and no clubbing  PSYCH: alert & oriented x 3, fluent speech NEURO: no focal motor/sensory deficits  LABORATORY DATA:  I have reviewed the data as listed    Latest Ref Rng & Units 03/02/2022    3:46 PM 03/02/2022    3:15 AM 02/27/2022    9:12 AM  CBC  WBC 4.0 - 10.5 K/uL   8.2   Hemoglobin 12.0 - 15.0 g/dL 9.7  7.5  16.1   Hematocrit 36.0 - 46.0 % 29.3  23.8  36.1   Platelets 150 - 400 K/uL   233        Latest Ref Rng & Units 02/27/2022    9:12 AM 11/20/2019    4:03 AM 11/19/2019    7:25 PM  CMP  Glucose 70 - 99 mg/dL 096  045  409   BUN 8 - 23 mg/dL 30  28  30    Creatinine 0.44 - 1.00 mg/dL 8.11  9.14  7.82   Sodium 135 - 145 mmol/L 139  141  142   Potassium 3.5 - 5.1 mmol/L 5.1  4.6  4.6   Chloride 98 - 111 mmol/L 103  104  103   CO2 22 - 32 mmol/L 28  28  28    Calcium 8.9 - 10.3 mg/dL 8.7  8.3  8.7      RADIOGRAPHIC STUDIES: I have personally reviewed the  radiological images as listed and agreed with the findings in the report. No results found.  ASSESSMENT & PLAN Alisha [name] is a 89 y.o. female who presents to the diagnostic clinic for evaluation of metastatic mucinous carcinoma of the skin, favoring breast or ovarian primary. We reviewed our recommended diagnostic workup which includes laboratory evaluation today followed by mammogram, breast US and PET/CT imaging to identify the primary. Patient expressed understanding of the plan provided and agreed to move forward.   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 --Need left breast mammogram and [place] including the axilla to rule out breast primary. --Need PET/CT scan to evaluate for other target lesions. (Unable to obtain CT scan with contrast due to elevated creatinine of 1.81 today). --Labs today to check CBC and CMP. Will check tumor markers for breast and ovarian cancer including CA 27.29, CA15-3, CA125. --RTC once workup is complete  #Right DCIS: --S/P right mastectomy in June 2015 by Dr. Donell Beers. --Last screening mammogram was in 2022.   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 Under the care of gastroenterologist, Dr. Marca Ancona. --Periodically receives IV iron as she is unable to tolerate PO iron. Last received IV feraheme on 10/03/2022 and 10/10/2022. --Will repeat iron panel today.  No orders of the defined types were placed in this encounter.   All questions were answered. The patient knows to call the clinic with any problems, questions or concerns.  I have spent a total of 60 minutes minutes of face-to-face and non-face-to-face time, preparing to see the patient, obtaining and/or reviewing separately obtained history, performing a medically appropriate examination, counseling and educating  the patient, ordering tests/procedures, documenting clinical information in the electronic health record, independently interpreting results and communicating results to the patient, and care coordination.   Alisha Kaufmann, Alisha [name] Department of Hematology/Oncology [facility] at [facility] Phone: [phone]  Patient was seen with Dr. Leonides Schanz.  I have read the above note and personally examined the patient. I agree with the assessment and plan as noted above.  Briefly Alisha [name] is an 89 year old female who presents for evaluation of a concerning skin lesion.  She had a skin lesion biopsied by dermatology which was consistent with a mucinous carcinoma, most suspicious for metastatic disease.  Possible etiologies include breast cancer versus ovarian cancer.  In order to further evaluate this we wanted a CT chest abdomen pelvis with contrast, however due to the patient's kidney function she would not be able to tolerate contrast material.  I do not think a study without contrast would be as valuable, therefore recommended a PET CT scan in order to get to the root.  We will also request a mammogram in order to assess for breast primary.  Once the final diagnosis is obtained we will transfer the patient to the care of breast surgery versus GYN Onc for further evaluation.  The patient voiced understanding of our findings and the plan moving forward.   Ulysees Barns, MD Department of Hematology/Oncology [facility] at [facility] Phone:  [phone] Pager: [phone] Email: [email or web address]

## 2023-05-16 ENCOUNTER — Other Ambulatory Visit: Payer: Self-pay

## 2023-05-16 LAB — CANCER ANTIGEN 15-3: CA 15-3: 13.5 U/mL (ref 0.0–25.0)

## 2023-05-16 LAB — CA 125: Cancer Antigen (CA) 125: 9.9 U/mL (ref 0.0–38.1)

## 2023-05-16 LAB — CANCER ANTIGEN 27.29: CA 27.29: 17.6 U/mL (ref 0.0–38.6)

## 2023-05-16 LAB — FERRITIN: Ferritin: 8 ng/mL — ABNORMAL LOW (ref 11–307)

## 2023-05-21 ENCOUNTER — Telehealth: Payer: Self-pay

## 2023-05-21 DIAGNOSIS — R92321 Mammographic fibroglandular density, right breast: Secondary | ICD-10-CM | POA: Diagnosis not present

## 2023-05-21 DIAGNOSIS — Z86000 Personal history of in-situ neoplasm of breast: Secondary | ICD-10-CM | POA: Diagnosis not present

## 2023-05-21 NOTE — Telephone Encounter (Signed)
Please notify patient that her tumor markers are normal-pt advised with VU  Her iron is low, please see if she is agreeable to IV iron-pt requested Dr Marca Ancona to continue to monitor  Her PET is on 7/18 can you try to move it up sooner?  PET rescheduled to 7/9 at 3:00

## 2023-05-22 NOTE — Telephone Encounter (Signed)
Pt advised of normal mammogram results with VU

## 2023-05-28 ENCOUNTER — Encounter (HOSPITAL_COMMUNITY)
Admission: RE | Admit: 2023-05-28 | Discharge: 2023-05-28 | Disposition: A | Discharge status: Home / Self Care | Payer: Medicare Other | Source: Ambulatory Visit | Attending: Physician Assistant | Admitting: Physician Assistant

## 2023-05-28 ENCOUNTER — Encounter (HOSPITAL_COMMUNITY): Payer: Medicare Other

## 2023-05-28 DIAGNOSIS — C792 Secondary malignant neoplasm of skin: Secondary | ICD-10-CM | POA: Insufficient documentation

## 2023-05-28 DIAGNOSIS — C799 Secondary malignant neoplasm of unspecified site: Secondary | ICD-10-CM | POA: Insufficient documentation

## 2023-05-28 DIAGNOSIS — Z9011 Acquired absence of right breast and nipple: Secondary | ICD-10-CM | POA: Diagnosis not present

## 2023-05-28 LAB — GLUCOSE, CAPILLARY: Glucose-Capillary: 110 mg/dL — ABNORMAL HIGH (ref 70–99)

## 2023-05-28 MED ORDER — FLUDEOXYGLUCOSE F - 18 (FDG) INJECTION
7.4700 | Freq: Once | INTRAVENOUS | Status: AC
  Administered 2023-05-28: 7.47 via INTRAVENOUS

## 2023-05-29 ENCOUNTER — Telehealth: Payer: Self-pay

## 2023-05-29 DIAGNOSIS — D509 Iron deficiency anemia, unspecified: Secondary | ICD-10-CM | POA: Diagnosis not present

## 2023-05-29 NOTE — Telephone Encounter (Signed)
I called Ms. Alisha [name] to review the PET scan results form 05/28/2023. There was no evidence of malignancy including breast or ovarian masses. Since prior mammogram was negative as well, we do not recommend any further workup. Dr. Leonides Schanz recommends patient follow up with her dermatologist to address her skin biopsies that confirmed carcinoma. Patient expressed understanding of the plan provided.

## 2023-05-29 NOTE — Telephone Encounter (Signed)
Per Rolland Porter request  PET scan, mammogram and 6/26 office note faxed to pt's PCP Dr Jacqulyn Bath and Dr Roderic Scarce at [facility].  Confirmations received

## 2023-06-06 ENCOUNTER — Other Ambulatory Visit (HOSPITAL_COMMUNITY): Payer: Medicare Other

## 2023-06-10 ENCOUNTER — Telehealth: Payer: Self-pay | Admitting: Hematology and Oncology

## 2023-06-10 ENCOUNTER — Telehealth: Payer: Self-pay | Admitting: *Deleted

## 2023-06-10 NOTE — Telephone Encounter (Signed)
Patient is aware of upcoming appointment times/dates.  

## 2023-06-10 NOTE — Telephone Encounter (Signed)
Received call from [facility], Dr. Marca Ancona. They are inquiring about pt's need for IV iron. Reviewed lab results with that office. Advised that I would let Alisha Kaufmann, PA know of the call and the possible need for IV iron.

## 2023-06-12 ENCOUNTER — Telehealth: Payer: Self-pay

## 2023-06-12 NOTE — Telephone Encounter (Signed)
T/C from pt stating she has changed her mind she wants [facility] to set up her iron infusion rather than Dr Marca Ancona. I spoke with Dr Laurey Morale nurse, Cyndia Bent, and she was in agreement with pt's request.  Message sent to Georga Kaufmann, PA-C to advise on appt location.

## 2023-06-17 ENCOUNTER — Other Ambulatory Visit: Payer: Self-pay | Admitting: Physician Assistant

## 2023-06-17 DIAGNOSIS — D508 Other iron deficiency anemias: Secondary | ICD-10-CM

## 2023-06-17 DIAGNOSIS — D509 Iron deficiency anemia, unspecified: Secondary | ICD-10-CM | POA: Insufficient documentation

## 2023-06-20 ENCOUNTER — Telehealth: Payer: Self-pay | Admitting: Pharmacy Technician

## 2023-06-20 NOTE — Telephone Encounter (Signed)
Karena Addison,  Monoferric is non preferred with BCBS and will be denied if patient has not failed preferred medication.  Preferred medication is Feraheme. Would you like to try Feraheme?  Auth Submission: NO AUTH NEEDED Site of care: Site of care: [facility] Payer: BCBS / MEDICARE A/B Medication & CPT/J Code(s) submitted: Feraheme (ferumoxytol) [id number] Route of submission (phone, fax, portal):  Phone # Fax # Auth type: Buy/Bill HB Units/visits requested: X2 Reference number:  Approval from: 06/20/23 to 11/31/24

## 2023-06-21 ENCOUNTER — Other Ambulatory Visit: Payer: Self-pay

## 2023-06-26 ENCOUNTER — Ambulatory Visit (INDEPENDENT_AMBULATORY_CARE_PROVIDER_SITE_OTHER): Payer: Medicare Other

## 2023-06-26 VITALS — BP 124/73 | HR 56 | Temp 98.1°F | Resp 16 | Ht 61.0 in | Wt 153.1 lb

## 2023-06-26 DIAGNOSIS — D509 Iron deficiency anemia, unspecified: Secondary | ICD-10-CM | POA: Diagnosis not present

## 2023-06-26 DIAGNOSIS — D508 Other iron deficiency anemias: Secondary | ICD-10-CM

## 2023-06-26 MED ORDER — ACETAMINOPHEN 325 MG PO TABS
650.0000 mg | ORAL_TABLET | Freq: Once | ORAL | Status: AC
  Administered 2023-06-26: 650 mg via ORAL
  Filled 2023-06-26: qty 2

## 2023-06-26 MED ORDER — SODIUM CHLORIDE 0.9 % IV SOLN
510.0000 mg | Freq: Once | INTRAVENOUS | Status: AC
  Administered 2023-06-26: 510 mg via INTRAVENOUS
  Filled 2023-06-26: qty 17

## 2023-06-26 MED ORDER — DIPHENHYDRAMINE HCL 25 MG PO CAPS
25.0000 mg | ORAL_CAPSULE | Freq: Once | ORAL | Status: AC
  Administered 2023-06-26: 25 mg via ORAL
  Filled 2023-06-26: qty 1

## 2023-06-26 NOTE — Progress Notes (Signed)
Diagnosis: Iron Deficiency Anemia  Provider:  Chilton Greathouse MD  Procedure: IV Infusion  IV Type: Peripheral, IV Location: L Antecubital  Feraheme (Ferumoxytol), Dose: 510 mg  Infusion Start Time: 1106  Infusion Stop Time: 1125  Post Infusion IV Care: Observation period completed and Peripheral IV Discontinued  Discharge: Condition: Good, Destination: Home . AVS Provided  Performed by:  Garnette Czech, RN

## 2023-07-08 ENCOUNTER — Ambulatory Visit (INDEPENDENT_AMBULATORY_CARE_PROVIDER_SITE_OTHER): Payer: Medicare Other

## 2023-07-08 VITALS — BP 128/70 | HR 56 | Temp 97.7°F | Resp 20 | Ht 61.0 in | Wt 152.6 lb

## 2023-07-08 DIAGNOSIS — D508 Other iron deficiency anemias: Secondary | ICD-10-CM

## 2023-07-08 DIAGNOSIS — D509 Iron deficiency anemia, unspecified: Secondary | ICD-10-CM

## 2023-07-08 MED ORDER — DIPHENHYDRAMINE HCL 25 MG PO CAPS
25.0000 mg | ORAL_CAPSULE | Freq: Once | ORAL | Status: AC
  Administered 2023-07-08: 25 mg via ORAL
  Filled 2023-07-08: qty 1

## 2023-07-08 MED ORDER — SODIUM CHLORIDE 0.9 % IV SOLN
510.0000 mg | Freq: Once | INTRAVENOUS | Status: AC
  Administered 2023-07-08: 510 mg via INTRAVENOUS
  Filled 2023-07-08: qty 17

## 2023-07-08 MED ORDER — ACETAMINOPHEN 325 MG PO TABS
650.0000 mg | ORAL_TABLET | Freq: Once | ORAL | Status: AC
  Administered 2023-07-08: 650 mg via ORAL
  Filled 2023-07-08: qty 2

## 2023-07-08 NOTE — Progress Notes (Signed)
Diagnosis: Iron Deficiency Anemia  Provider:  Chilton Greathouse MD  Procedure: IV Infusion  IV Type: Peripheral, IV Location: L Antecubital  Feraheme (Ferumoxytol), Dose: 510 mg  Infusion Start Time: 1029  Infusion Stop Time: 1050  Post Infusion IV Care: Observation period completed and Peripheral IV Discontinued  Discharge: Condition: Good, Destination: Home . AVS Provided  Performed by:  Garnette Czech, RN

## 2023-07-10 DIAGNOSIS — Z85828 Personal history of other malignant neoplasm of skin: Secondary | ICD-10-CM | POA: Diagnosis not present

## 2023-07-10 DIAGNOSIS — C44399 Other specified malignant neoplasm of skin of other parts of face: Secondary | ICD-10-CM | POA: Diagnosis not present

## 2023-07-10 DIAGNOSIS — C441921 Other specified malignant neoplasm of skin of right upper eyelid, including canthus: Secondary | ICD-10-CM | POA: Diagnosis not present

## 2023-07-24 ENCOUNTER — Inpatient Hospital Stay: Payer: Medicare Other | Attending: Physician Assistant

## 2023-07-24 ENCOUNTER — Inpatient Hospital Stay (HOSPITAL_BASED_OUTPATIENT_CLINIC_OR_DEPARTMENT_OTHER): Payer: Medicare Other | Admitting: Hematology and Oncology

## 2023-07-24 ENCOUNTER — Other Ambulatory Visit: Payer: Self-pay | Admitting: Hematology and Oncology

## 2023-07-24 VITALS — BP 138/66 | HR 62 | Temp 97.7°F | Resp 13 | Wt 152.7 lb

## 2023-07-24 DIAGNOSIS — C792 Secondary malignant neoplasm of skin: Secondary | ICD-10-CM | POA: Diagnosis not present

## 2023-07-24 DIAGNOSIS — D509 Iron deficiency anemia, unspecified: Secondary | ICD-10-CM | POA: Diagnosis not present

## 2023-07-24 DIAGNOSIS — Z86000 Personal history of in-situ neoplasm of breast: Secondary | ICD-10-CM | POA: Insufficient documentation

## 2023-07-24 DIAGNOSIS — D508 Other iron deficiency anemias: Secondary | ICD-10-CM

## 2023-07-24 DIAGNOSIS — Z79899 Other long term (current) drug therapy: Secondary | ICD-10-CM | POA: Diagnosis not present

## 2023-07-24 DIAGNOSIS — C799 Secondary malignant neoplasm of unspecified site: Secondary | ICD-10-CM | POA: Diagnosis not present

## 2023-07-24 DIAGNOSIS — Z9011 Acquired absence of right breast and nipple: Secondary | ICD-10-CM | POA: Diagnosis not present

## 2023-07-24 DIAGNOSIS — C801 Malignant (primary) neoplasm, unspecified: Secondary | ICD-10-CM | POA: Diagnosis not present

## 2023-07-24 LAB — CMP (CANCER CENTER ONLY)
ALT: 11 U/L (ref 0–44)
AST: 15 U/L (ref 15–41)
Albumin: 3.6 g/dL (ref 3.5–5.0)
Alkaline Phosphatase: 49 U/L (ref 38–126)
Anion gap: 6 (ref 5–15)
BUN: 30 mg/dL — ABNORMAL HIGH (ref 8–23)
CO2: 32 mmol/L (ref 22–32)
Calcium: 8.7 mg/dL — ABNORMAL LOW (ref 8.9–10.3)
Chloride: 105 mmol/L (ref 98–111)
Creatinine: 1.66 mg/dL — ABNORMAL HIGH (ref 0.44–1.00)
GFR, Estimated: 29 mL/min — ABNORMAL LOW (ref >60)
Glucose, Bld: 202 mg/dL — ABNORMAL HIGH (ref 70–99)
Potassium: 4.3 mmol/L (ref 3.5–5.1)
Sodium: 143 mmol/L (ref 135–145)
Total Bilirubin: 0.5 mg/dL (ref 0.3–1.2)
Total Protein: 6.5 g/dL (ref 6.5–8.1)

## 2023-07-24 LAB — CBC WITH DIFFERENTIAL (CANCER CENTER ONLY)
Abs Immature Granulocytes: 0.01 10*3/uL (ref 0.00–0.07)
Basophils Absolute: 0.1 10*3/uL (ref 0.0–0.1)
Basophils Relative: 1 %
Eosinophils Absolute: 1 10*3/uL — ABNORMAL HIGH (ref 0.0–0.5)
Eosinophils Relative: 14 %
HCT: 35.6 % — ABNORMAL LOW (ref 36.0–46.0)
Hemoglobin: 10.9 g/dL — ABNORMAL LOW (ref 12.0–15.0)
Immature Granulocytes: 0 %
Lymphocytes Relative: 14 %
Lymphs Abs: 1 10*3/uL (ref 0.7–4.0)
MCH: 27.2 pg (ref 26.0–34.0)
MCHC: 30.6 g/dL (ref 30.0–36.0)
MCV: 88.8 fL (ref 80.0–100.0)
Monocytes Absolute: 0.5 10*3/uL (ref 0.1–1.0)
Monocytes Relative: 7 %
Neutro Abs: 4.6 10*3/uL (ref 1.7–7.7)
Neutrophils Relative %: 64 %
Platelet Count: 163 10*3/uL (ref 150–400)
RBC: 4.01 MIL/uL (ref 3.87–5.11)
RDW: 19 % — ABNORMAL HIGH (ref 11.5–15.5)
WBC Count: 7.2 10*3/uL (ref 4.0–10.5)
nRBC: 0 % (ref 0.0–0.2)

## 2023-07-24 LAB — IRON AND IRON BINDING CAPACITY (CC-WL,HP ONLY)
Iron: 88 ug/dL (ref 28–170)
Saturation Ratios: 31 % (ref 10.4–31.8)
TIBC: 281 ug/dL (ref 250–450)
UIBC: 193 ug/dL (ref 148–442)

## 2023-07-24 LAB — RETIC PANEL
Immature Retic Fract: 11.7 % (ref 2.3–15.9)
RBC.: 3.9 MIL/uL (ref 3.87–5.11)
Retic Count, Absolute: 55 10*3/uL (ref 19.0–186.0)
Retic Ct Pct: 1.4 % (ref 0.4–3.1)
Reticulocyte Hemoglobin: 33.5 pg (ref >27.9)

## 2023-07-24 LAB — FERRITIN: Ferritin: 249 ng/mL (ref 11–307)

## 2023-07-24 NOTE — Progress Notes (Signed)
[facility] Telephone:([phone]   Fax:([phone]  PROGRESS NOTE  Patient Care Team: Ollen Bowl, MD as PCP - General (Internal Medicine) Kerin Salen, MD as Consulting Physician (Gastroenterology)  Hematological/Oncological History # Carcinoma of the Skin  04/23/2023-05/02/2023: underwent skin biopsies of her left temple and near her right eyelid  Pathology revealed dermal mucinous carcinoma, most concerning for breast or ovarian primary.  05/28/2023: NM PET CT scan showed no evidence of breast cancer primary or ovarian cancer primary malignancy or recurrence.  # Iron Deficiency Anemia  05/15/2023: Labs showed white blood cell count 7.7, hemoglobin 10.7, MCV 82.2, and platelets of 270.  Saturation ratio 8% with ferritin of 8% 8/7-8/19/2024: IV Feraheme 510 mg x 2 doses  07/24/2023: Hemoglobin increased to 10.9 with saturation of 31% and ferritin of 249  Interval History:  Alisha [name] 89 y.o. female with medical history significant for iron deficiency anemia who presents for a follow up visit. The patient's last visit was on 05/15/2023 at which time she established care. In the interim since the last visit she has received 2 doses of IV Feraheme.  On exam today Ms. Aziz reports that she tolerated her to iron infusions well.  She did not have any side effects.  Unfortunately she also did not notice any improvement in her energy levels.  She reports she is not having any issues with lightheadedness, dizziness, or shortness of breath.  Overall she feels about the same with no major ups or downs.  She otherwise reports no recent infectious symptoms such as runny nose, sore throat, cough.  She denies any fevers, chills, sweats.  A full 10 point ROS is otherwise negative.  MEDICAL HISTORY:  Past Medical History:  Diagnosis Date   Anemia    takes Ferrous Sulfate daily   Arthritis    B12 deficiency    takes Vit B12 Q3 months   Blood transfusion    Chronic kidney disease     Colon polyps    Diabetes mellitus    takes Metformin and Glipizide daily   Hemorrhoids    History of blood transfusion    no abnormal reaction noted   Hyperlipidemia    takes Zetia daily   Hypertension    takes Diovan and Metoprolol daily   Hypothyroidism    Joint pain    Multiple thyroid nodules    takes Synthroid daily   Osteoporosis    takes Fosamax every Wed   Peripheral vascular disease (HCC)    DVT  2008 with ? anticoagulation   Pneumonia    PONV (postoperative nausea and vomiting)     SURGICAL HISTORY: Past Surgical History:  Procedure Laterality Date   COLONOSCOPY WITH PROPOFOL N/A 12/06/2014   Procedure: COLONOSCOPY WITH PROPOFOL;  Surgeon: Charolett Bumpers, MD;  Location: [facility];  Service: Endoscopy;  Laterality: N/A;   ESOPHAGOGASTRODUODENOSCOPY (EGD) WITH PROPOFOL N/A 12/06/2014   Procedure: ESOPHAGOGASTRODUODENOSCOPY (EGD) WITH PROPOFOL;  Surgeon: Charolett Bumpers, MD;  Location: [facility];  Service: Endoscopy;  Laterality: N/A;   FEMUR SURGERY Left 07/08/11   plates and screws   FRACTURE SURGERY     8/12 femur fx with rod and bolt placed   HEMORRHOIDECTOMY WITH HEMORRHOID BANDING     HERNIA REPAIR  1945   IR ANGIO EXTRACRAN SEL COM CAROTID INNOMINATE UNI BILAT MOD SED  06/13/2018   IR ANGIO VERTEBRAL SEL SUBCLAVIAN INNOMINATE UNI R MOD SED  06/13/2018   IR ANGIO VERTEBRAL SEL VERTEBRAL UNI L  MOD SED  06/13/2018   MASTECTOMY W/ SENTINEL NODE BIOPSY Right 05/13/2014   Procedure: MASTECTOMY WITH SENTINEL LYMPH NODE BIOPSY;  Surgeon: Almond Lint, MD;  Location: [facility];  Service: General;  Laterality: Right;   REVERSE SHOULDER ARTHROPLASTY Left 03/01/2022   Procedure: LEFT REVERSE SHOULDER ARTHROPLASTY;  Surgeon: Jones Broom, MD;  Location: [facility];  Service: Orthopedics;  Laterality: Left;   THYROIDECTOMY  12/04/2011   Procedure: THYROIDECTOMY;  Surgeon: Velora Heckler, MD;  Location: [facility];  Service: General;  Laterality: N/A;  total thyroidectomy     SOCIAL HISTORY: Social History   Socioeconomic History   Marital status: Widowed    Spouse name: Not on file   Number of children: 1   Years of education: 12   Highest education level: High school graduate  Occupational History   Occupation: Retired  Tobacco Use   Smoking status: Never   Smokeless tobacco: Never  Vaping Use   Vaping status: Never Used  Substance and Sexual Activity   Alcohol use: No   Drug use: No   Sexual activity: Never    Birth control/protection: Post-menopausal  Other Topics Concern   Not on file  Social History Narrative   Lives at home alone.   Right-handed.   2 cups caffeine per day, occasional soda.   Social Determinants of Health   Financial Resource Strain: Not on file  Food Insecurity: Not on file  Transportation Needs: Not on file  Physical Activity: Not on file  Stress: Not on file  Social Connections: Not on file  Intimate Partner Violence: Not on file    FAMILY HISTORY: Family History  Problem Relation Age of Onset   Hypertension Mother    Hypertension Father    Cancer Brother     ALLERGIES:  is allergic to ace inhibitors, codeine, evista [raloxifene hydrochloride], and zocor [simvastatin - high dose].  MEDICATIONS:  Current Outpatient Medications  Medication Sig Dispense Refill   acetaminophen (TYLENOL) 325 MG tablet Take 2 tablets (650 mg total) by mouth every 6 (six) hours as needed for mild pain or moderate pain. 30 tablet 0   amLODipine (NORVASC) 5 MG tablet Take 1 tablet (5 mg total) by mouth daily. 30 tablet 0   Calcium Carbonate (CALTRATE 600 PO) Take 600 mg by mouth daily.     carvedilol (COREG) 25 MG tablet Take 1 tablet (25 mg total) by mouth 2 (two) times daily with a meal. 60 tablet 0   gabapentin (NEURONTIN) 100 MG capsule Take 300 mg by mouth at bedtime.     glipiZIDE (GLUCOTROL XL) 5 MG 24 hr tablet Take 5 mg by mouth daily before breakfast.     isosorbide mononitrate (IMDUR) 30 MG 24 hr tablet Take 1  tablet (30 mg total) by mouth daily. 30 tablet 0   levothyroxine (SYNTHROID) 100 MCG tablet Take 100 mcg by mouth every morning.     loratadine (CLARITIN) 10 MG tablet Take 10 mg by mouth daily.     Multiple Vitamins-Minerals (CENTRUM SILVER PO) Take by mouth.     SEMGLEE, YFGN, 100 UNIT/ML Pen Inject 8 Units into the skin at bedtime.     UNABLE TO FIND 174.9 Right Mastectomy   L8000- Post Surgical Bras-6  L8030- Silicone Breast Prosthesis-1 1 each 0   ZETIA 10 MG tablet Take 10 mg by mouth daily before breakfast.      No current facility-administered medications for this visit.    REVIEW OF SYSTEMS:   Constitutional: ( - )  fevers, ( - )  chills , ( - ) night sweats Eyes: ( - ) blurriness of vision, ( - ) double vision, ( - ) watery eyes Ears, nose, mouth, throat, and face: ( - ) mucositis, ( - ) sore throat Respiratory: ( - ) cough, ( - ) dyspnea, ( - ) wheezes Cardiovascular: ( - ) palpitation, ( - ) chest discomfort, ( - ) lower extremity swelling Gastrointestinal:  ( - ) nausea, ( - ) heartburn, ( - ) change in bowel habits Skin: ( - ) abnormal skin rashes Lymphatics: ( - ) new lymphadenopathy, ( - ) easy bruising Neurological: ( - ) numbness, ( - ) tingling, ( - ) new weaknesses Behavioral/Psych: ( - ) mood change, ( - ) new changes  All other systems were reviewed with the patient and are negative.  PHYSICAL EXAMINATION:  Vitals:   07/24/23 1030  BP: 138/66  Pulse: 62  Resp: 13  Temp: 97.7 F (36.5 C)  SpO2: 97%   Filed Weights   07/24/23 1030  Weight: 152 lb 11.2 oz (69.3 kg)    GENERAL: Well-appearing elderly Caucasian female, alert, no distress and comfortable SKIN: skin color, texture, turgor are normal, no rashes or significant lesions EYES: conjunctiva are pink and non-injected, sclera clear LUNGS: clear to auscultation and percussion with normal breathing effort HEART: regular rate & rhythm and no murmurs and no lower extremity edema Musculoskeletal: no  cyanosis of digits and no clubbing  PSYCH: alert & oriented x 3, fluent speech NEURO: no focal motor/sensory deficits  LABORATORY DATA:  I have reviewed the data as listed    Latest Ref Rng & Units 07/24/2023    9:50 AM 05/15/2023    3:15 PM 03/02/2022    3:46 PM  CBC  WBC 4.0 - 10.5 K/uL 7.2  7.7    Hemoglobin 12.0 - 15.0 g/dL 46.9  62.9  9.7   Hematocrit 36.0 - 46.0 % 35.6  35.0  29.3   Platelets 150 - 400 K/uL 163  270         Latest Ref Rng & Units 07/24/2023    9:50 AM 05/15/2023    3:15 PM 02/27/2022    9:12 AM  CMP  Glucose 70 - 99 mg/dL 528  97  413   BUN 8 - 23 mg/dL 30  35  30   Creatinine 0.44 - 1.00 mg/dL 2.44  0.10  2.72   Sodium 135 - 145 mmol/L 143  141  139   Potassium 3.5 - 5.1 mmol/L 4.3  4.4  5.1   Chloride 98 - 111 mmol/L 105  100  103   CO2 22 - 32 mmol/L 32  35  28   Calcium 8.9 - 10.3 mg/dL 8.7  9.4  8.7   Total Protein 6.5 - 8.1 g/dL 6.5  7.5    Total Bilirubin 0.3 - 1.2 mg/dL 0.5  0.4    Alkaline Phos 38 - 126 U/L 49  56    AST 15 - 41 U/L 15  15    ALT 0 - 44 U/L 11  13      RADIOGRAPHIC STUDIES: No results found.  ASSESSMENT & PLAN Alisha [name] 89 y.o. female with medical history significant for iron deficiency anemia who presents for a follow up visit.   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 -- left  breast mammogram and [place] including the axilla ruled out breast primary. -- PET/CT scan to evaluate for primary did not reveal any FDG avidity or other evidence of lesions on the skin, breast, or ovaries. --Tumor markers were unremarkable. --At this time no evidence of further cancers elsewhere in the body.  Will continue to monitor.  Recommend continue to follow-up with dermatology.   #Right DCIS: --S/P right mastectomy in June 2015 by Dr. Donell Beers. --Last screening mammogram was in 2022.    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 Under the care of gastroenterologist, Dr. Marca Ancona. --Periodically receives IV iron as she is unable to tolerate PO iron. Received IV feraheme on 10/03/2022 and 10/10/2022.  Again IV iron was administered on 06/26/2023 and 07/08/2023. -- Modest improvement in hemoglobin up to 10.9, with marked improvement in underlying iron levels. --Return to clinic in 6 months time with labs 1 week before.    No orders of the defined types were placed in this encounter.   All questions were answered. The patient knows to call the clinic with any problems, questions or concerns.  A total of more than 30 minutes were spent on this encounter with face-to-face time and non-face-to-face time, including preparing to see the patient, ordering tests and/or medications, counseling the patient and coordination of care as outlined above.   Ulysees Barns, MD Department of Hematology/Oncology [facility] Phone: [phone] Pager: [phone] Email: [email or web address]  08/03/2023 5:39 PM

## 2023-08-13 DIAGNOSIS — N1832 Chronic kidney disease, stage 3b: Secondary | ICD-10-CM | POA: Diagnosis not present

## 2023-08-13 DIAGNOSIS — E1122 Type 2 diabetes mellitus with diabetic chronic kidney disease: Secondary | ICD-10-CM | POA: Diagnosis not present

## 2023-08-13 DIAGNOSIS — D631 Anemia in chronic kidney disease: Secondary | ICD-10-CM | POA: Diagnosis not present

## 2023-08-13 DIAGNOSIS — I129 Hypertensive chronic kidney disease with stage 1 through stage 4 chronic kidney disease, or unspecified chronic kidney disease: Secondary | ICD-10-CM | POA: Diagnosis not present

## 2023-09-02 DIAGNOSIS — Z23 Encounter for immunization: Secondary | ICD-10-CM | POA: Diagnosis not present

## 2023-10-07 DIAGNOSIS — Z85828 Personal history of other malignant neoplasm of skin: Secondary | ICD-10-CM | POA: Diagnosis not present

## 2023-10-07 DIAGNOSIS — L821 Other seborrheic keratosis: Secondary | ICD-10-CM | POA: Diagnosis not present

## 2023-10-07 DIAGNOSIS — D1801 Hemangioma of skin and subcutaneous tissue: Secondary | ICD-10-CM | POA: Diagnosis not present

## 2023-10-07 DIAGNOSIS — L570 Actinic keratosis: Secondary | ICD-10-CM | POA: Diagnosis not present

## 2023-10-08 DIAGNOSIS — N1832 Chronic kidney disease, stage 3b: Secondary | ICD-10-CM | POA: Diagnosis not present

## 2023-10-08 DIAGNOSIS — C4499 Other specified malignant neoplasm of skin, unspecified: Secondary | ICD-10-CM | POA: Diagnosis not present

## 2023-10-08 DIAGNOSIS — D500 Iron deficiency anemia secondary to blood loss (chronic): Secondary | ICD-10-CM | POA: Diagnosis not present

## 2023-10-08 DIAGNOSIS — D0510 Intraductal carcinoma in situ of unspecified breast: Secondary | ICD-10-CM | POA: Diagnosis not present

## 2023-10-08 DIAGNOSIS — E7800 Pure hypercholesterolemia, unspecified: Secondary | ICD-10-CM | POA: Diagnosis not present

## 2023-10-08 DIAGNOSIS — D510 Vitamin B12 deficiency anemia due to intrinsic factor deficiency: Secondary | ICD-10-CM | POA: Diagnosis not present

## 2023-10-08 DIAGNOSIS — M810 Age-related osteoporosis without current pathological fracture: Secondary | ICD-10-CM | POA: Diagnosis not present

## 2023-10-08 DIAGNOSIS — E1121 Type 2 diabetes mellitus with diabetic nephropathy: Secondary | ICD-10-CM | POA: Diagnosis not present

## 2023-10-08 DIAGNOSIS — E039 Hypothyroidism, unspecified: Secondary | ICD-10-CM | POA: Diagnosis not present

## 2023-10-08 DIAGNOSIS — I5030 Unspecified diastolic (congestive) heart failure: Secondary | ICD-10-CM | POA: Diagnosis not present

## 2023-10-08 DIAGNOSIS — E042 Nontoxic multinodular goiter: Secondary | ICD-10-CM | POA: Diagnosis not present

## 2023-10-08 DIAGNOSIS — Z0000 Encounter for general adult medical examination without abnormal findings: Secondary | ICD-10-CM | POA: Diagnosis not present

## 2023-10-08 DIAGNOSIS — I129 Hypertensive chronic kidney disease with stage 1 through stage 4 chronic kidney disease, or unspecified chronic kidney disease: Secondary | ICD-10-CM | POA: Diagnosis not present

## 2023-10-14 DIAGNOSIS — D510 Vitamin B12 deficiency anemia due to intrinsic factor deficiency: Secondary | ICD-10-CM | POA: Diagnosis not present

## 2023-10-21 DIAGNOSIS — D510 Vitamin B12 deficiency anemia due to intrinsic factor deficiency: Secondary | ICD-10-CM | POA: Diagnosis not present

## 2023-10-28 DIAGNOSIS — D510 Vitamin B12 deficiency anemia due to intrinsic factor deficiency: Secondary | ICD-10-CM | POA: Diagnosis not present

## 2023-11-04 DIAGNOSIS — D510 Vitamin B12 deficiency anemia due to intrinsic factor deficiency: Secondary | ICD-10-CM | POA: Diagnosis not present

## 2023-11-18 DIAGNOSIS — H6691 Otitis media, unspecified, right ear: Secondary | ICD-10-CM | POA: Diagnosis not present

## 2023-11-18 DIAGNOSIS — R0981 Nasal congestion: Secondary | ICD-10-CM | POA: Diagnosis not present

## 2023-11-18 DIAGNOSIS — R051 Acute cough: Secondary | ICD-10-CM | POA: Diagnosis not present

## 2023-11-25 DIAGNOSIS — D510 Vitamin B12 deficiency anemia due to intrinsic factor deficiency: Secondary | ICD-10-CM | POA: Diagnosis not present

## 2023-12-10 DIAGNOSIS — D510 Vitamin B12 deficiency anemia due to intrinsic factor deficiency: Secondary | ICD-10-CM | POA: Diagnosis not present

## 2023-12-23 DIAGNOSIS — M8588 Other specified disorders of bone density and structure, other site: Secondary | ICD-10-CM | POA: Diagnosis not present

## 2023-12-23 DIAGNOSIS — D510 Vitamin B12 deficiency anemia due to intrinsic factor deficiency: Secondary | ICD-10-CM | POA: Diagnosis not present

## 2023-12-27 ENCOUNTER — Telehealth: Payer: Self-pay | Admitting: Physician Assistant

## 2023-12-27 NOTE — Telephone Encounter (Signed)
 Rescheduled appointments per provider on call. Patient is aware of the changes made and will be mailed an appointment reminder.

## 2024-01-06 DIAGNOSIS — D510 Vitamin B12 deficiency anemia due to intrinsic factor deficiency: Secondary | ICD-10-CM | POA: Diagnosis not present

## 2024-01-20 ENCOUNTER — Other Ambulatory Visit: Payer: Medicare Other

## 2024-01-27 ENCOUNTER — Ambulatory Visit: Payer: Medicare Other | Admitting: Hematology and Oncology

## 2024-01-30 DIAGNOSIS — D510 Vitamin B12 deficiency anemia due to intrinsic factor deficiency: Secondary | ICD-10-CM | POA: Diagnosis not present

## 2024-01-30 DIAGNOSIS — G629 Polyneuropathy, unspecified: Secondary | ICD-10-CM | POA: Diagnosis not present

## 2024-01-30 DIAGNOSIS — M810 Age-related osteoporosis without current pathological fracture: Secondary | ICD-10-CM | POA: Diagnosis not present

## 2024-02-04 ENCOUNTER — Other Ambulatory Visit: Payer: Self-pay | Admitting: Hematology and Oncology

## 2024-02-04 ENCOUNTER — Inpatient Hospital Stay: Payer: Medicare Other | Attending: Hematology and Oncology

## 2024-02-04 DIAGNOSIS — Z79899 Other long term (current) drug therapy: Secondary | ICD-10-CM | POA: Diagnosis not present

## 2024-02-04 DIAGNOSIS — D508 Other iron deficiency anemias: Secondary | ICD-10-CM

## 2024-02-04 DIAGNOSIS — D509 Iron deficiency anemia, unspecified: Secondary | ICD-10-CM | POA: Insufficient documentation

## 2024-02-04 DIAGNOSIS — Z86000 Personal history of in-situ neoplasm of breast: Secondary | ICD-10-CM | POA: Diagnosis not present

## 2024-02-04 LAB — CBC WITH DIFFERENTIAL (CANCER CENTER ONLY)
Abs Immature Granulocytes: 0.02 10*3/uL (ref 0.00–0.07)
Basophils Absolute: 0 10*3/uL (ref 0.0–0.1)
Basophils Relative: 1 %
Eosinophils Absolute: 0.2 10*3/uL (ref 0.0–0.5)
Eosinophils Relative: 3 %
HCT: 34.2 % — ABNORMAL LOW (ref 36.0–46.0)
Hemoglobin: 10.5 g/dL — ABNORMAL LOW (ref 12.0–15.0)
Immature Granulocytes: 0 %
Lymphocytes Relative: 16 %
Lymphs Abs: 1.1 10*3/uL (ref 0.7–4.0)
MCH: 26.8 pg (ref 26.0–34.0)
MCHC: 30.7 g/dL (ref 30.0–36.0)
MCV: 87.2 fL (ref 80.0–100.0)
Monocytes Absolute: 0.6 10*3/uL (ref 0.1–1.0)
Monocytes Relative: 9 %
Neutro Abs: 4.8 10*3/uL (ref 1.7–7.7)
Neutrophils Relative %: 71 %
Platelet Count: 236 10*3/uL (ref 150–400)
RBC: 3.92 MIL/uL (ref 3.87–5.11)
RDW: 13.5 % (ref 11.5–15.5)
WBC Count: 6.7 10*3/uL (ref 4.0–10.5)
nRBC: 0 % (ref 0.0–0.2)

## 2024-02-04 LAB — RETIC PANEL
Immature Retic Fract: 20.4 % — ABNORMAL HIGH (ref 2.3–15.9)
RBC.: 3.94 MIL/uL (ref 3.87–5.11)
Retic Count, Absolute: 47.7 10*3/uL (ref 19.0–186.0)
Retic Ct Pct: 1.2 % (ref 0.4–3.1)
Reticulocyte Hemoglobin: 29 pg (ref >27.9)

## 2024-02-04 LAB — CMP (CANCER CENTER ONLY)
ALT: 13 U/L (ref 0–44)
AST: 15 U/L (ref 15–41)
Albumin: 3.8 g/dL (ref 3.5–5.0)
Alkaline Phosphatase: 55 U/L (ref 38–126)
Anion gap: 8 (ref 5–15)
BUN: 44 mg/dL — ABNORMAL HIGH (ref 8–23)
CO2: 33 mmol/L — ABNORMAL HIGH (ref 22–32)
Calcium: 8.8 mg/dL — ABNORMAL LOW (ref 8.9–10.3)
Chloride: 100 mmol/L (ref 98–111)
Creatinine: 1.99 mg/dL — ABNORMAL HIGH (ref 0.44–1.00)
GFR, Estimated: 24 mL/min — ABNORMAL LOW (ref >60)
Glucose, Bld: 255 mg/dL — ABNORMAL HIGH (ref 70–99)
Potassium: 3.9 mmol/L (ref 3.5–5.1)
Sodium: 141 mmol/L (ref 135–145)
Total Bilirubin: 0.4 mg/dL (ref 0.0–1.2)
Total Protein: 6.9 g/dL (ref 6.5–8.1)

## 2024-02-04 LAB — IRON AND IRON BINDING CAPACITY (CC-WL,HP ONLY)
Iron: 39 ug/dL (ref 28–170)
Saturation Ratios: 10 % — ABNORMAL LOW (ref 10.4–31.8)
TIBC: 374 ug/dL (ref 250–450)
UIBC: 335 ug/dL (ref 148–442)

## 2024-02-04 LAB — FERRITIN: Ferritin: 9 ng/mL — ABNORMAL LOW (ref 11–307)

## 2024-02-04 LAB — VITAMIN B12: Vitamin B-12: 1381 pg/mL — ABNORMAL HIGH (ref 180–914)

## 2024-02-07 LAB — METHYLMALONIC ACID, SERUM: Methylmalonic Acid, Quantitative: 378 nmol/L (ref 0–378)

## 2024-02-10 ENCOUNTER — Other Ambulatory Visit: Payer: Self-pay | Admitting: Physician Assistant

## 2024-02-10 DIAGNOSIS — D508 Other iron deficiency anemias: Secondary | ICD-10-CM

## 2024-02-11 ENCOUNTER — Inpatient Hospital Stay (HOSPITAL_BASED_OUTPATIENT_CLINIC_OR_DEPARTMENT_OTHER): Payer: Medicare Other | Admitting: Physician Assistant

## 2024-02-11 ENCOUNTER — Telehealth: Payer: Self-pay | Admitting: Pharmacy Technician

## 2024-02-11 VITALS — BP 137/63 | HR 61 | Temp 97.8°F | Resp 16 | Ht 61.0 in | Wt 153.1 lb

## 2024-02-11 DIAGNOSIS — D508 Other iron deficiency anemias: Secondary | ICD-10-CM

## 2024-02-11 DIAGNOSIS — Z79899 Other long term (current) drug therapy: Secondary | ICD-10-CM | POA: Diagnosis not present

## 2024-02-11 DIAGNOSIS — Z86000 Personal history of in-situ neoplasm of breast: Secondary | ICD-10-CM | POA: Diagnosis not present

## 2024-02-11 DIAGNOSIS — D509 Iron deficiency anemia, unspecified: Secondary | ICD-10-CM | POA: Diagnosis not present

## 2024-02-11 NOTE — Progress Notes (Signed)
 [facility] Telephone:(336) [phone]   Fax:([phone]  PROGRESS NOTE  Patient Care Team: Ollen Bowl, MD as PCP - General (Internal Medicine) Kerin Salen, MD as Consulting Physician (Gastroenterology)  Hematological/Oncological History # Carcinoma of the Skin  04/23/2023-05/02/2023: underwent skin biopsies of her left temple and near her right eyelid  Pathology revealed dermal mucinous carcinoma, most concerning for breast or ovarian primary.  05/28/2023: NM PET CT scan showed no evidence of breast cancer primary or ovarian cancer primary malignancy or recurrence.  # Iron Deficiency Anemia  05/15/2023: Labs showed white blood cell count 7.7, hemoglobin 10.7, MCV 82.2, and platelets of 270.  Saturation ratio 8% with ferritin of 8% 8/7-8/19/2024: IV Feraheme 510 mg x 2 doses  07/24/2023: Hemoglobin increased to 10.9 with saturation of 31% and ferritin of 249  Interval History:  RAELEE [name] 89 y.o. female with medical history significant for iron deficiency anemia who presents for a follow up visit. The patient's last visit was on 07/24/2023. In the interim, she was recently diagnosed with osteoporosis.   On exam today Ms. Canion reports she has noticed some fatigue recently but continues to complete her ADLs. She denies any appetite changes or weight loss. She denies nausea, vomiting or bowel habit changes. She denies easy bruising or signs of bleeding. She was recently diagnosed with osteoporosis and prescribed alendronic acid but has yet to start the medication due to the potential side effects. She uses a walker to help with ambulation. She denies any recent falls. She denies any fevers, chills, sweats, shortness of breath, chest pain, cough, headaches or dizziness.  A full 10 point ROS is otherwise negative.  MEDICAL HISTORY:  Past Medical History:  Diagnosis Date   Anemia    takes Ferrous Sulfate daily   Arthritis    B12 deficiency    takes Vit B12 Q3 months    Blood transfusion    Chronic kidney disease    Colon polyps    Diabetes mellitus    takes Metformin and Glipizide daily   Hemorrhoids    History of blood transfusion    no abnormal reaction noted   Hyperlipidemia    takes Zetia daily   Hypertension    takes Diovan and Metoprolol daily   Hypothyroidism    Joint pain    Multiple thyroid nodules    takes Synthroid daily   Osteoporosis    takes Fosamax every Wed   Peripheral vascular disease (HCC)    DVT  2008 with ? anticoagulation   Pneumonia    PONV (postoperative nausea and vomiting)     SURGICAL HISTORY: Past Surgical History:  Procedure Laterality Date   COLONOSCOPY WITH PROPOFOL N/A 12/06/2014   Procedure: COLONOSCOPY WITH PROPOFOL;  Surgeon: Charolett Bumpers, MD;  [facility]: [facility];  Service: Endoscopy;  Laterality: N/A;   ESOPHAGOGASTRODUODENOSCOPY (EGD) WITH PROPOFOL N/A 12/06/2014   Procedure: ESOPHAGOGASTRODUODENOSCOPY (EGD) WITH PROPOFOL;  Surgeon: Charolett Bumpers, MD;  [facility]: [facility];  Service: Endoscopy;  Laterality: N/A;   FEMUR SURGERY Left 07/08/11   plates and screws   FRACTURE SURGERY     8/12 femur fx with rod and bolt placed   HEMORRHOIDECTOMY WITH HEMORRHOID BANDING     HERNIA REPAIR  1945   IR ANGIO EXTRACRAN SEL COM CAROTID INNOMINATE UNI BILAT MOD SED  06/13/2018   IR ANGIO VERTEBRAL SEL SUBCLAVIAN INNOMINATE UNI R MOD SED  06/13/2018   IR ANGIO VERTEBRAL SEL VERTEBRAL UNI L MOD SED  06/13/2018  MASTECTOMY W/ SENTINEL NODE BIOPSY Right 05/13/2014   Procedure: MASTECTOMY WITH SENTINEL LYMPH NODE BIOPSY;  Surgeon: Almond Lint, MD;  Location: [facility];  Service: General;  Laterality: Right;   REVERSE SHOULDER ARTHROPLASTY Left 03/01/2022   Procedure: LEFT REVERSE SHOULDER ARTHROPLASTY;  Surgeon: Jones Broom, MD;  Location: [facility];  Service: Orthopedics;  Laterality: Left;   THYROIDECTOMY  12/04/2011   Procedure: THYROIDECTOMY;  Surgeon: Velora Heckler, MD;  Location: [facility];  Service: General;   Laterality: N/A;  total thyroidectomy    SOCIAL HISTORY: Social History   Socioeconomic History   Marital status: Widowed    Spouse name: Not on file   Number of children: 1   Years of education: 12   Highest education level: High school graduate  Occupational History   Occupation: Retired  Tobacco Use   Smoking status: Never   Smokeless tobacco: Never  Vaping Use   Vaping status: Never Used  Substance and Sexual Activity   Alcohol use: No   Drug use: No   Sexual activity: Never    Birth control/protection: Post-menopausal  Other Topics Concern   Not on file  Social History Narrative   Lives at home alone.   Right-handed.   2 cups caffeine per day, occasional soda.   Social Drivers of Corporate investment banker Strain: Not on file  Food Insecurity: Not on file  Transportation Needs: Not on file  Physical Activity: Not on file  Stress: Not on file  Social Connections: Not on file  Intimate Partner Violence: Not on file    FAMILY HISTORY: Family History  Problem Relation Age of Onset   Hypertension Mother    Hypertension Father    Cancer Brother     ALLERGIES:  is allergic to ace inhibitors, codeine, evista [raloxifene hydrochloride], and zocor [simvastatin - high dose].  MEDICATIONS:  Current Outpatient Medications  Medication Sig Dispense Refill   acetaminophen (TYLENOL) 325 MG tablet Take 2 tablets (650 mg total) by mouth every 6 (six) hours as needed for mild pain or moderate pain. 30 tablet 0   Calcium Carbonate (CALTRATE 600 PO) Take 600 mg by mouth daily.     carvedilol (COREG) 25 MG tablet Take 1 tablet (25 mg total) by mouth 2 (two) times daily with a meal. 60 tablet 0   gabapentin (NEURONTIN) 100 MG capsule Take 100 mg by mouth at bedtime.     glipiZIDE (GLUCOTROL XL) 5 MG 24 hr tablet Take 5 mg by mouth daily before breakfast.     isosorbide mononitrate (IMDUR) 30 MG 24 hr tablet Take 1 tablet (30 mg total) by mouth daily. 30 tablet 0    levothyroxine (SYNTHROID) 100 MCG tablet Take 100 mcg by mouth every morning.     loratadine (CLARITIN) 10 MG tablet Take 10 mg by mouth daily.     Multiple Vitamins-Minerals (CENTRUM SILVER PO) Take by mouth.     SEMGLEE, YFGN, 100 UNIT/ML Pen Inject 8 Units into the skin at bedtime.     UNABLE TO FIND 174.9 Right Mastectomy   L8000- Post Surgical Bras-6  L8030- Silicone Breast Prosthesis-1 1 each 0   ZETIA 10 MG tablet Take 10 mg by mouth daily before breakfast.      amLODipine (NORVASC) 5 MG tablet Take 1 tablet (5 mg total) by mouth daily. 30 tablet 0   No current facility-administered medications for this visit.    REVIEW OF SYSTEMS:   Constitutional: ( - ) fevers, ( - )  chills , ( - ) night sweats Eyes: ( - ) blurriness of vision, ( - ) double vision, ( - ) watery eyes Ears, nose, mouth, throat, and face: ( - ) mucositis, ( - ) sore throat Respiratory: ( - ) cough, ( - ) dyspnea, ( - ) wheezes Cardiovascular: ( - ) palpitation, ( - ) chest discomfort, ( - ) lower extremity swelling Gastrointestinal:  ( - ) nausea, ( - ) heartburn, ( - ) change in bowel habits Skin: ( - ) abnormal skin rashes Lymphatics: ( - ) new lymphadenopathy, ( - ) easy bruising Neurological: ( - ) numbness, ( - ) tingling, ( - ) new weaknesses Behavioral/Psych: ( - ) mood change, ( - ) new changes  All other systems were reviewed with the patient and are negative.  PHYSICAL EXAMINATION:  Vitals:   02/11/24 1119  BP: 137/63  Pulse: 61  Resp: 16  Temp: 97.8 F (36.6 C)  SpO2: 97%   Filed Weights   02/11/24 1119  Weight: 153 lb 1.6 oz (69.4 kg)    GENERAL: Well-appearing elderly Caucasian female, alert, no distress and comfortable SKIN: skin color, texture, turgor are normal, no rashes or significant lesions EYES: conjunctiva are pink and non-injected, sclera clear LUNGS: clear to auscultation and percussion with normal breathing effort HEART: regular rate & rhythm and no murmurs. Bilateral  ankle extremity edema Musculoskeletal: no cyanosis of digits and no clubbing  PSYCH: alert & oriented x 3, fluent speech NEURO: no focal motor/sensory deficits  LABORATORY DATA:  I have reviewed the data as listed    Latest Ref Rng & Units 02/04/2024   10:01 AM 07/24/2023    9:50 AM 05/15/2023    3:15 PM  CBC  WBC 4.0 - 10.5 K/uL 6.7  7.2  7.7   Hemoglobin 12.0 - 15.0 g/dL 16.1  09.6  04.5   Hematocrit 36.0 - 46.0 % 34.2  35.6  35.0   Platelets 150 - 400 K/uL 236  163  270        Latest Ref Rng & Units 02/04/2024   10:01 AM 07/24/2023    9:50 AM 05/15/2023    3:15 PM  CMP  Glucose 70 - 99 mg/dL 409  811  97   BUN 8 - 23 mg/dL 44  30  35   Creatinine 0.44 - 1.00 mg/dL 9.14  7.82  9.56   Sodium 135 - 145 mmol/L 141  143  141   Potassium 3.5 - 5.1 mmol/L 3.9  4.3  4.4   Chloride 98 - 111 mmol/L 100  105  100   CO2 22 - 32 mmol/L 33  32  35   Calcium 8.9 - 10.3 mg/dL 8.8  8.7  9.4   Total Protein 6.5 - 8.1 g/dL 6.9  6.5  7.5   Total Bilirubin 0.0 - 1.2 mg/dL 0.4  0.5  0.4   Alkaline Phos 38 - 126 U/L 55  49  56   AST 15 - 41 U/L 15  15  15    ALT 0 - 44 U/L 13  11  13      RADIOGRAPHIC STUDIES: No results found.  ASSESSMENT & PLAN Alisha [name] is a 89 y.o. female with medical history significant for iron deficiency anemia who presents for a follow up visit.   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 Under the care of gastroenterologist, Dr. Marca Ancona. --Periodically receives IV iron as she is  unable to tolerate PO iron. Last received IV feraheme 510 mg x 2 doses on 06/26/2023 and 07/08/2023. --Labs from 02/04/2024 showed anemia with Hgb 10.5, MCV 87.2.Iron panel shows deficiency with saturation 10%, ferritin 9. --Recommend IV feraheme 510 mg x 2 doses to bolster levels.  --RTC in 3 months with labs only and 6 months with labs/follow up.   #Newly diagnosed osteoporosis: --Encouraged patient to try alendronic acid to help  prevent fractures.  --Encouraged to use walker to minimize fall risk.   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 -- left breast mammogram and [place] including the axilla ruled out breast primary. -- PET/CT scan to evaluate for primary did not reveal any FDG avidity or other evidence of lesions on the skin, breast, or ovaries. --Tumor markers were unremarkable. --At this time no evidence of further cancers elsewhere in the body.  Will continue to monitor.  Recommend continue to follow-up with dermatology.   #Right DCIS: --S/P right mastectomy in June 2015 by Dr. Donell Beers. --Last screening mammogram was in 2022.      No orders of the defined types were placed in this encounter.   All questions were answered. The patient knows to call the clinic with any problems, questions or concerns.   I have spent a total of 25 minutes minutes of face-to-face and non-face-to-face time, preparing to see the patient, performing a medically appropriate examination, counseling and educating the patient, ordering medications/tests/procedures, documenting clinical information in the electronic health record, independently interpreting results and communicating results to the patient, and care coordination.   Georga Kaufmann PA-C Dept of Hematology and Oncology [facility] Phone: [phone]

## 2024-02-11 NOTE — Telephone Encounter (Signed)
 Auth Submission: NO AUTH NEEDED Site of care: Site of care: [facility] Payer: MEDICARE A/B & BCBS Medication & CPT/J Code(s) submitted: Feraheme (ferumoxytol) [id number] Route of submission (phone, fax, portal):  Phone # Fax # Auth type: Buy/Bill PB Units/visits requested: 2 DOSES Reference number: [id number]:[id number] (BCBS) Approval from: 02/11/24 to 07/13/24

## 2024-02-24 ENCOUNTER — Ambulatory Visit (INDEPENDENT_AMBULATORY_CARE_PROVIDER_SITE_OTHER)

## 2024-02-24 VITALS — BP 121/67 | HR 59 | Temp 97.7°F | Resp 18 | Ht 61.0 in | Wt 155.8 lb

## 2024-02-24 DIAGNOSIS — D509 Iron deficiency anemia, unspecified: Secondary | ICD-10-CM

## 2024-02-24 DIAGNOSIS — D508 Other iron deficiency anemias: Secondary | ICD-10-CM

## 2024-02-24 MED ORDER — SODIUM CHLORIDE 0.9 % IV SOLN
510.0000 mg | Freq: Once | INTRAVENOUS | Status: AC
  Administered 2024-02-24: 510 mg via INTRAVENOUS
  Filled 2024-02-24: qty 17

## 2024-02-24 NOTE — Progress Notes (Signed)
 Diagnosis: Iron Deficiency Anemia  Provider:  Chilton Greathouse MD  Procedure: IV Infusion  IV Type: Peripheral, IV Location: L Antecubital  Feraheme (Ferumoxytol), Dose: 510 mg  Infusion Start Time: 1328  Infusion Stop Time: 1344  Post Infusion IV Care: Observation period completed and Peripheral IV Discontinued  Discharge: Condition: Good, Destination: Home . AVS Declined  Performed by:  Garnette Czech, RN

## 2024-03-02 DIAGNOSIS — D510 Vitamin B12 deficiency anemia due to intrinsic factor deficiency: Secondary | ICD-10-CM | POA: Diagnosis not present

## 2024-03-03 ENCOUNTER — Ambulatory Visit

## 2024-03-03 VITALS — BP 100/66 | HR 63 | Temp 98.4°F | Resp 18 | Ht 61.0 in | Wt 152.6 lb

## 2024-03-03 DIAGNOSIS — D509 Iron deficiency anemia, unspecified: Secondary | ICD-10-CM | POA: Diagnosis not present

## 2024-03-03 DIAGNOSIS — D508 Other iron deficiency anemias: Secondary | ICD-10-CM

## 2024-03-03 MED ORDER — SODIUM CHLORIDE 0.9 % IV SOLN
510.0000 mg | Freq: Once | INTRAVENOUS | Status: AC
  Administered 2024-03-03: 510 mg via INTRAVENOUS
  Filled 2024-03-03: qty 17

## 2024-03-03 NOTE — Progress Notes (Signed)
 Diagnosis: Iron Deficiency Anemia  Provider:  Praveen Mannam MD  Procedure: IV Infusion  IV Type: Peripheral, IV Location: L Antecubital  Feraheme (Ferumoxytol), Dose: 510 mg  Infusion Start Time: 1007  Infusion Stop Time: 1023  Post Infusion IV Care: Observation period completed and Peripheral IV Discontinued  Discharge: Condition: Good, Destination: Home . AVS Declined  Performed by:  Star East, LPN

## 2024-03-05 DIAGNOSIS — M25512 Pain in left shoulder: Secondary | ICD-10-CM | POA: Diagnosis not present

## 2024-03-30 DIAGNOSIS — D510 Vitamin B12 deficiency anemia due to intrinsic factor deficiency: Secondary | ICD-10-CM | POA: Diagnosis not present

## 2024-04-27 DIAGNOSIS — E1121 Type 2 diabetes mellitus with diabetic nephropathy: Secondary | ICD-10-CM | POA: Diagnosis not present

## 2024-04-27 DIAGNOSIS — D510 Vitamin B12 deficiency anemia due to intrinsic factor deficiency: Secondary | ICD-10-CM | POA: Diagnosis not present

## 2024-04-27 DIAGNOSIS — C4499 Other specified malignant neoplasm of skin, unspecified: Secondary | ICD-10-CM | POA: Diagnosis not present

## 2024-04-27 DIAGNOSIS — M810 Age-related osteoporosis without current pathological fracture: Secondary | ICD-10-CM | POA: Diagnosis not present

## 2024-04-27 DIAGNOSIS — I129 Hypertensive chronic kidney disease with stage 1 through stage 4 chronic kidney disease, or unspecified chronic kidney disease: Secondary | ICD-10-CM | POA: Diagnosis not present

## 2024-04-27 DIAGNOSIS — N1832 Chronic kidney disease, stage 3b: Secondary | ICD-10-CM | POA: Diagnosis not present

## 2024-04-27 DIAGNOSIS — E042 Nontoxic multinodular goiter: Secondary | ICD-10-CM | POA: Diagnosis not present

## 2024-04-27 DIAGNOSIS — E7800 Pure hypercholesterolemia, unspecified: Secondary | ICD-10-CM | POA: Diagnosis not present

## 2024-04-27 DIAGNOSIS — D509 Iron deficiency anemia, unspecified: Secondary | ICD-10-CM | POA: Diagnosis not present

## 2024-04-27 DIAGNOSIS — G629 Polyneuropathy, unspecified: Secondary | ICD-10-CM | POA: Diagnosis not present

## 2024-04-27 DIAGNOSIS — I5030 Unspecified diastolic (congestive) heart failure: Secondary | ICD-10-CM | POA: Diagnosis not present

## 2024-04-27 DIAGNOSIS — E039 Hypothyroidism, unspecified: Secondary | ICD-10-CM | POA: Diagnosis not present

## 2024-05-12 ENCOUNTER — Inpatient Hospital Stay: Attending: Physician Assistant

## 2024-05-12 DIAGNOSIS — D509 Iron deficiency anemia, unspecified: Secondary | ICD-10-CM | POA: Insufficient documentation

## 2024-05-12 DIAGNOSIS — D508 Other iron deficiency anemias: Secondary | ICD-10-CM

## 2024-05-12 LAB — CBC WITH DIFFERENTIAL (CANCER CENTER ONLY)
Abs Immature Granulocytes: 0.02 10*3/uL (ref 0.00–0.07)
Basophils Absolute: 0.1 10*3/uL (ref 0.0–0.1)
Basophils Relative: 1 %
Eosinophils Absolute: 0.3 10*3/uL (ref 0.0–0.5)
Eosinophils Relative: 4 %
HCT: 39.4 % (ref 36.0–46.0)
Hemoglobin: 12.5 g/dL (ref 12.0–15.0)
Immature Granulocytes: 0 %
Lymphocytes Relative: 15 %
Lymphs Abs: 1.2 10*3/uL (ref 0.7–4.0)
MCH: 28.4 pg (ref 26.0–34.0)
MCHC: 31.7 g/dL (ref 30.0–36.0)
MCV: 89.5 fL (ref 80.0–100.0)
Monocytes Absolute: 0.6 10*3/uL (ref 0.1–1.0)
Monocytes Relative: 8 %
Neutro Abs: 5.9 10*3/uL (ref 1.7–7.7)
Neutrophils Relative %: 72 %
Platelet Count: 183 10*3/uL (ref 150–400)
RBC: 4.4 MIL/uL (ref 3.87–5.11)
RDW: 15.9 % — ABNORMAL HIGH (ref 11.5–15.5)
WBC Count: 8.1 10*3/uL (ref 4.0–10.5)
nRBC: 0 % (ref 0.0–0.2)

## 2024-05-12 LAB — IRON AND IRON BINDING CAPACITY (CC-WL,HP ONLY)
Iron: 73 ug/dL (ref 28–170)
Saturation Ratios: 25 % (ref 10.4–31.8)
TIBC: 294 ug/dL (ref 250–450)
UIBC: 221 ug/dL (ref 148–442)

## 2024-05-12 LAB — FERRITIN: Ferritin: 42 ng/mL (ref 11–307)

## 2024-05-13 ENCOUNTER — Ambulatory Visit: Payer: Self-pay | Admitting: Physician Assistant

## 2024-05-26 DIAGNOSIS — D510 Vitamin B12 deficiency anemia due to intrinsic factor deficiency: Secondary | ICD-10-CM | POA: Diagnosis not present

## 2024-06-15 DIAGNOSIS — Z1231 Encounter for screening mammogram for malignant neoplasm of breast: Secondary | ICD-10-CM | POA: Diagnosis not present

## 2024-06-29 DIAGNOSIS — D510 Vitamin B12 deficiency anemia due to intrinsic factor deficiency: Secondary | ICD-10-CM | POA: Diagnosis not present

## 2024-07-23 DIAGNOSIS — H35373 Puckering of macula, bilateral: Secondary | ICD-10-CM | POA: Diagnosis not present

## 2024-07-23 DIAGNOSIS — H26491 Other secondary cataract, right eye: Secondary | ICD-10-CM | POA: Diagnosis not present

## 2024-07-23 DIAGNOSIS — E119 Type 2 diabetes mellitus without complications: Secondary | ICD-10-CM | POA: Diagnosis not present

## 2024-07-23 DIAGNOSIS — Z961 Presence of intraocular lens: Secondary | ICD-10-CM | POA: Diagnosis not present

## 2024-07-23 DIAGNOSIS — H353132 Nonexudative age-related macular degeneration, bilateral, intermediate dry stage: Secondary | ICD-10-CM | POA: Diagnosis not present

## 2024-07-23 DIAGNOSIS — H52203 Unspecified astigmatism, bilateral: Secondary | ICD-10-CM | POA: Diagnosis not present

## 2024-07-29 DIAGNOSIS — H26491 Other secondary cataract, right eye: Secondary | ICD-10-CM | POA: Diagnosis not present

## 2024-08-03 DIAGNOSIS — D510 Vitamin B12 deficiency anemia due to intrinsic factor deficiency: Secondary | ICD-10-CM | POA: Diagnosis not present

## 2024-08-10 ENCOUNTER — Other Ambulatory Visit: Payer: Self-pay | Admitting: Hematology and Oncology

## 2024-08-10 DIAGNOSIS — D508 Other iron deficiency anemias: Secondary | ICD-10-CM

## 2024-08-11 ENCOUNTER — Inpatient Hospital Stay (HOSPITAL_BASED_OUTPATIENT_CLINIC_OR_DEPARTMENT_OTHER): Admitting: Physician Assistant

## 2024-08-11 ENCOUNTER — Inpatient Hospital Stay: Attending: Physician Assistant

## 2024-08-11 VITALS — BP 154/83 | HR 71 | Temp 97.2°F | Resp 14 | Wt 152.6 lb

## 2024-08-11 DIAGNOSIS — M810 Age-related osteoporosis without current pathological fracture: Secondary | ICD-10-CM | POA: Insufficient documentation

## 2024-08-11 DIAGNOSIS — D508 Other iron deficiency anemias: Secondary | ICD-10-CM | POA: Diagnosis not present

## 2024-08-11 DIAGNOSIS — R5383 Other fatigue: Secondary | ICD-10-CM | POA: Diagnosis not present

## 2024-08-11 DIAGNOSIS — L985 Mucinosis of the skin: Secondary | ICD-10-CM | POA: Diagnosis not present

## 2024-08-11 DIAGNOSIS — Z9011 Acquired absence of right breast and nipple: Secondary | ICD-10-CM | POA: Diagnosis not present

## 2024-08-11 DIAGNOSIS — Z79899 Other long term (current) drug therapy: Secondary | ICD-10-CM | POA: Insufficient documentation

## 2024-08-11 DIAGNOSIS — D509 Iron deficiency anemia, unspecified: Secondary | ICD-10-CM | POA: Insufficient documentation

## 2024-08-11 DIAGNOSIS — Z86000 Personal history of in-situ neoplasm of breast: Secondary | ICD-10-CM | POA: Insufficient documentation

## 2024-08-11 LAB — CBC WITH DIFFERENTIAL (CANCER CENTER ONLY)
Abs Immature Granulocytes: 0.02 K/uL (ref 0.00–0.07)
Basophils Absolute: 0 K/uL (ref 0.0–0.1)
Basophils Relative: 1 %
Eosinophils Absolute: 0.1 K/uL (ref 0.0–0.5)
Eosinophils Relative: 2 %
HCT: 36.2 % (ref 36.0–46.0)
Hemoglobin: 11.6 g/dL — ABNORMAL LOW (ref 12.0–15.0)
Immature Granulocytes: 0 %
Lymphocytes Relative: 13 %
Lymphs Abs: 0.9 K/uL (ref 0.7–4.0)
MCH: 28.8 pg (ref 26.0–34.0)
MCHC: 32 g/dL (ref 30.0–36.0)
MCV: 89.8 fL (ref 80.0–100.0)
Monocytes Absolute: 0.5 K/uL (ref 0.1–1.0)
Monocytes Relative: 7 %
Neutro Abs: 5.7 K/uL (ref 1.7–7.7)
Neutrophils Relative %: 77 %
Platelet Count: 198 K/uL (ref 150–400)
RBC: 4.03 MIL/uL (ref 3.87–5.11)
RDW: 13.7 % (ref 11.5–15.5)
WBC Count: 7.3 K/uL (ref 4.0–10.5)
nRBC: 0 % (ref 0.0–0.2)

## 2024-08-11 LAB — RETIC PANEL
Immature Retic Fract: 14.3 % (ref 2.3–15.9)
RBC.: 4.05 MIL/uL (ref 3.87–5.11)
Retic Count, Absolute: 56.7 K/uL (ref 19.0–186.0)
Retic Ct Pct: 1.4 % (ref 0.4–3.1)
Reticulocyte Hemoglobin: 32.2 pg (ref >27.9)

## 2024-08-11 LAB — CMP (CANCER CENTER ONLY)
ALT: 12 U/L (ref 0–44)
AST: 16 U/L (ref 15–41)
Albumin: 3.7 g/dL (ref 3.5–5.0)
Alkaline Phosphatase: 49 U/L (ref 38–126)
Anion gap: 7 (ref 5–15)
BUN: 33 mg/dL — ABNORMAL HIGH (ref 8–23)
CO2: 31 mmol/L (ref 22–32)
Calcium: 8.4 mg/dL — ABNORMAL LOW (ref 8.9–10.3)
Chloride: 102 mmol/L (ref 98–111)
Creatinine: 1.8 mg/dL — ABNORMAL HIGH (ref 0.44–1.00)
GFR, Estimated: 26 mL/min — ABNORMAL LOW (ref >60)
Glucose, Bld: 290 mg/dL — ABNORMAL HIGH (ref 70–99)
Potassium: 4.2 mmol/L (ref 3.5–5.1)
Sodium: 140 mmol/L (ref 135–145)
Total Bilirubin: 0.5 mg/dL (ref 0.0–1.2)
Total Protein: 6.7 g/dL (ref 6.5–8.1)

## 2024-08-11 LAB — FERRITIN: Ferritin: 38 ng/mL (ref 11–307)

## 2024-08-11 LAB — IRON AND IRON BINDING CAPACITY (CC-WL,HP ONLY)
Iron: 85 ug/dL (ref 28–170)
Saturation Ratios: 28 % (ref 10.4–31.8)
TIBC: 309 ug/dL (ref 250–450)
UIBC: 224 ug/dL (ref 148–442)

## 2024-08-11 NOTE — Progress Notes (Signed)
 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  05/28/2023: NM PET CT scan showed no evidence of breast cancer primary or ovarian cancer primary malignancy or recurrence.  # Iron  Deficiency Anemia  05/15/2023: Labs showed white blood cell count 7.7, hemoglobin 10.7, MCV 82.2, and platelets of 270.  Saturation ratio 8% with ferritin of 8% 8/7-8/19/2024: IV Feraheme  510 mg x 2 doses  07/24/2023: Hemoglobin increased to 10.9 with saturation of 31% and ferritin of 249  Interval History:  Alisha [name] 89 y.o. female with medical history significant for iron  deficiency anemia who presents for a follow up visit. The patient's last visit was on 02/11/2024. In the interim, she denies any changes to her health.   On exam today Ms. Dearman reports having ongoing fatigue which she feels is age related. She is able to complete her ADLs on her own. She denies overt signs of bleeding including hematochezia or melena. She denies nausea, vomiting or bowel habit changes. She denies any fevers, chills, sweats, shortness of breath, chest pain, cough, headaches or dizziness.  A full 10 point ROS is otherwise negative.  MEDICAL HISTORY:  Past Medical History:  Diagnosis Date   Anemia    takes Ferrous Sulfate  daily   Arthritis    B12 deficiency    takes Vit B12 Q3 months   Blood transfusion    Chronic kidney disease    Colon polyps    Diabetes mellitus    takes Metformin  and Glipizide  daily   Hemorrhoids    History of blood transfusion    no abnormal reaction noted   Hyperlipidemia    takes  Zetia  daily   Hypertension    takes Diovan  and Metoprolol  daily   Hypothyroidism    Joint pain    Multiple thyroid  nodules    takes Synthroid  daily   Osteoporosis    takes Fosamax every Wed   Peripheral vascular disease    DVT  2008 with ? anticoagulation   Pneumonia    PONV (postoperative nausea and vomiting)     SURGICAL HISTORY: Past Surgical History:  Procedure Laterality Date   COLONOSCOPY WITH PROPOFOL  N/A 12/06/2014   Procedure: COLONOSCOPY WITH PROPOFOL ;  Surgeon: Gladis MARLA Louder, MD;  Location: [facility];  Service: Endoscopy;  Laterality: N/A;   ESOPHAGOGASTRODUODENOSCOPY (EGD) WITH PROPOFOL  N/A 12/06/2014   Procedure: ESOPHAGOGASTRODUODENOSCOPY (EGD) WITH PROPOFOL ;  Surgeon: Gladis MARLA Louder, MD;  Location: [facility];  Service: Endoscopy;  Laterality: N/A;   FEMUR SURGERY Left 07/08/11   plates and screws   FRACTURE SURGERY     8/12 femur fx with rod and bolt placed   HEMORRHOIDECTOMY WITH HEMORRHOID BANDING     HERNIA REPAIR  1945   IR ANGIO EXTRACRAN SEL COM CAROTID INNOMINATE UNI BILAT MOD SED  06/13/2018   IR ANGIO VERTEBRAL SEL SUBCLAVIAN INNOMINATE UNI R MOD SED  06/13/2018   IR ANGIO VERTEBRAL SEL VERTEBRAL UNI L MOD SED  06/13/2018   MASTECTOMY W/ SENTINEL NODE BIOPSY Right 05/13/2014   Procedure: MASTECTOMY WITH SENTINEL LYMPH NODE BIOPSY;  Surgeon: Jina Nephew, MD;  Location: [facility];  Service: General;  Laterality: Right;  REVERSE SHOULDER ARTHROPLASTY Left 03/01/2022   Procedure: LEFT REVERSE SHOULDER ARTHROPLASTY;  Surgeon: Dozier Soulier, MD;  Location: [facility];  Service: Orthopedics;  Laterality: Left;   THYROIDECTOMY  12/04/2011   Procedure: THYROIDECTOMY;  Surgeon: Krystal CHRISTELLA Spinner, MD;  Location: [facility];  Service: General;  Laterality: N/A;  total thyroidectomy    SOCIAL HISTORY: Social History   Socioeconomic History   Marital status: Widowed    Spouse name: Not on file   Number of children: 1   Years of education: 12   Highest education level:  High school graduate  Occupational History   Occupation: Retired  Tobacco Use   Smoking status: Never   Smokeless tobacco: Never  Vaping Use   Vaping status: Never Used  Substance and Sexual Activity   Alcohol use: No   Drug use: No   Sexual activity: Never    Birth control/protection: Post-menopausal  Other Topics Concern   Not on file  Social History Narrative   Lives at home alone.   Right-handed.   2 cups caffeine per day, occasional soda.   Social Drivers of Corporate investment banker Strain: Not on file  Food Insecurity: Not on file  Transportation Needs: Not on file  Physical Activity: Not on file  Stress: Not on file  Social Connections: Not on file  Intimate Partner Violence: Not on file    FAMILY HISTORY: Family History  Problem Relation Age of Onset   Hypertension Mother    Hypertension Father    Cancer Brother     ALLERGIES:  is allergic to ace inhibitors, codeine, evista [raloxifene hydrochloride], and zocor [simvastatin - high dose].  MEDICATIONS:  Current Outpatient Medications  Medication Sig Dispense Refill   Calcium  Carbonate (CALTRATE 600 PO) Take 600 mg by mouth daily.     carvedilol  (COREG ) 25 MG tablet Take 1 tablet (25 mg total) by mouth 2 (two) times daily with a meal. 60 tablet 0   empagliflozin (JARDIANCE) 10 MG TABS tablet Take by mouth daily.     furosemide (LASIX) 20 MG tablet Take 20 mg by mouth as needed.     gabapentin  (NEURONTIN ) 100 MG capsule Take 100 mg by mouth at bedtime.     glipiZIDE  (GLUCOTROL  XL) 5 MG 24 hr tablet Take 5 mg by mouth daily before breakfast.     isosorbide  mononitrate (IMDUR ) 30 MG 24 hr tablet Take 1 tablet (30 mg total) by mouth daily. 30 tablet 0   levothyroxine  (SYNTHROID ) 100 MCG tablet Take 100 mcg by mouth every morning.     Multiple Vitamins-Minerals (CENTRUM SILVER PO) Take by mouth.     Multiple Vitamins-Minerals (CENTRUM SILVER PO) Take by mouth daily.     SEMGLEE , YFGN, 100 UNIT/ML Pen Inject 8  Units into the skin at bedtime.     UNABLE TO FIND 174.9 Right Mastectomy   L8000- Post Surgical Bras-6  L8030- Silicone Breast Prosthesis-1 1 each 0   ZETIA  10 MG tablet Take 10 mg by mouth daily before breakfast.      acetaminophen  (TYLENOL ) 325 MG tablet Take 2 tablets (650 mg total) by mouth every 6 (six) hours as needed for mild pain or moderate pain. (Patient not taking: Reported on 08/11/2024) 30 tablet 0   amLODipine  (NORVASC ) 5 MG tablet Take 1 tablet (5 mg total) by mouth daily. (Patient not taking: Reported on 08/11/2024) 30 tablet 0   loratadine (CLARITIN) 10 MG tablet Take 10 mg by mouth daily. (Patient not taking: Reported on  08/11/2024)     No current facility-administered medications for this visit.    REVIEW OF SYSTEMS:   Constitutional: ( - ) fevers, ( - )  chills , ( - ) night sweats Eyes: ( - ) blurriness of vision, ( - ) double vision, ( - ) watery eyes Ears, nose, mouth, throat, and face: ( - ) mucositis, ( - ) sore throat Respiratory: ( - ) cough, ( - ) dyspnea, ( - ) wheezes Cardiovascular: ( - ) palpitation, ( - ) chest discomfort, ( - ) lower extremity swelling Gastrointestinal:  ( - ) nausea, ( - ) heartburn, ( - ) change in bowel habits Skin: ( - ) abnormal skin rashes Lymphatics: ( - ) new lymphadenopathy, ( - ) easy bruising Neurological: ( - ) numbness, ( - ) tingling, ( - ) new weaknesses Behavioral/Psych: ( - ) mood change, ( - ) new changes  All other systems were reviewed with the patient and are negative.  PHYSICAL EXAMINATION:  Vitals:   08/11/24 1143  BP: (!) 154/83  Pulse: 71  Resp: 14  Temp: (!) 97.2 F (36.2 C)  SpO2: 95%    Filed Weights   08/11/24 1143  Weight: 152 lb 9.6 oz (69.2 kg)     GENERAL: Well-appearing elderly Caucasian female, alert, no distress and comfortable SKIN: skin color, texture, turgor are normal, no rashes or significant lesions EYES: conjunctiva are pink and non-injected, sclera clear LUNGS: clear to  auscultation and percussion with normal breathing effort HEART: regular rate & rhythm and no murmurs. Bilateral ankle extremity edema Musculoskeletal: no cyanosis of digits and no clubbing  PSYCH: alert & oriented x 3, fluent speech NEURO: no focal motor/sensory deficits  LABORATORY DATA:  I have reviewed the data as listed    Latest Ref Rng & Units 08/11/2024   10:31 AM 05/12/2024   10:25 AM 02/04/2024   10:01 AM  CBC  WBC 4.0 - 10.5 K/uL 7.3  8.1  6.7   Hemoglobin 12.0 - 15.0 g/dL 88.3  87.4  89.4   Hematocrit 36.0 - 46.0 % 36.2  39.4  34.2   Platelets 150 - 400 K/uL 198  183  236        Latest Ref Rng & Units 08/11/2024   10:31 AM 02/04/2024   10:01 AM 07/24/2023    9:50 AM  CMP  Glucose 70 - 99 mg/dL 709  744  797   BUN 8 - 23 mg/dL 33  44  30   Creatinine 0.44 - 1.00 mg/dL 8.19  8.00  8.33   Sodium 135 - 145 mmol/L 140  141  143   Potassium 3.5 - 5.1 mmol/L 4.2  3.9  4.3   Chloride 98 - 111 mmol/L 102  100  105   CO2 22 - 32 mmol/L 31  33  32   Calcium  8.9 - 10.3 mg/dL 8.4  8.8  8.7   Total Protein 6.5 - 8.1 g/dL 6.7  6.9  6.5   Total Bilirubin 0.0 - 1.2 mg/dL 0.5  0.4  0.5   Alkaline Phos 38 - 126 U/L 49  55  49   AST 15 - 41 U/L 16  15  15    ALT 0 - 44 U/L 12  13  11      RADIOGRAPHIC STUDIES: No results found.  ASSESSMENT & PLAN Alisha [name] is a 89 y.o. female with medical history significant for iron  deficiency anemia who presents for a follow up  visit.   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 Under the care of gastroenterologist, Dr. Saintclair. --Periodically receives IV iron  as she is unable to tolerate PO iron . Last received IV feraheme  510 mg x 2 doses on 02/24/2024-03/03/2024 --Labs from today show mild anemia with Hgb 11.6, MCV 89.8. Iron  panel shows no deficiency with iron  85, saturation 28%, ferritin 38 --No need for IV iron  at this time --RTC in 3 months with labs only and 6 months with  labs/follow up.   #Osteoporosis: --Encouraged patient to try alendronic acid to help prevent fractures.  --Encouraged to use walker to minimize fall risk.   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 -- left breast mammogram and US  including the axilla ruled out breast primary. -- PET/CT scan to evaluate for primary did not reveal any FDG avidity or other evidence of lesions on the skin, breast, or ovaries. --Tumor markers were unremarkable. --At this time no evidence of further cancers elsewhere in the body.  Will continue to monitor.  Recommend continue to follow-up with dermatology.   #Right DCIS: --S/P right mastectomy in June 2015 by Dr. Aron. --Last screening mammogram was in 2022.      Orders Placed This Encounter  Procedures   CBC with Differential (Cancer Center Only)    Standing Status:   Future    Expected Date:   11/10/2024    Expiration Date:   02/08/2025   CMP (Cancer Center only)    Standing Status:   Future    Expected Date:   11/10/2024    Expiration Date:   02/08/2025   Ferritin    Standing Status:   Future    Expected Date:   11/10/2024    Expiration Date:   02/08/2025   Iron  and Iron  Binding Capacity ([facility] only)    Standing Status:   Future    Expected Date:   11/10/2024    Expiration Date:   02/08/2025    All questions were answered. The patient knows to call the clinic with any problems, questions or concerns.   I have spent a total of 25 minutes minutes of face-to-face and non-face-to-face time, preparing to see the patient, performing a medically appropriate examination, counseling and educating the patient, ordering medications/tests/procedures, documenting clinical information in the electronic health record, independently interpreting results and communicating results to the patient, and care coordination.    Johnston Police PA-C Dept of Hematology and Oncology [facility] Phone: [phone]

## 2024-09-07 DIAGNOSIS — D510 Vitamin B12 deficiency anemia due to intrinsic factor deficiency: Secondary | ICD-10-CM | POA: Diagnosis not present

## 2024-09-07 DIAGNOSIS — Z23 Encounter for immunization: Secondary | ICD-10-CM | POA: Diagnosis not present

## 2024-09-29 DIAGNOSIS — C44722 Squamous cell carcinoma of skin of right lower limb, including hip: Secondary | ICD-10-CM | POA: Diagnosis not present

## 2024-09-29 DIAGNOSIS — L820 Inflamed seborrheic keratosis: Secondary | ICD-10-CM | POA: Diagnosis not present

## 2024-09-29 DIAGNOSIS — Z85828 Personal history of other malignant neoplasm of skin: Secondary | ICD-10-CM | POA: Diagnosis not present

## 2024-10-09 ENCOUNTER — Encounter: Payer: Self-pay | Admitting: Oncology

## 2024-10-14 DIAGNOSIS — D510 Vitamin B12 deficiency anemia due to intrinsic factor deficiency: Secondary | ICD-10-CM | POA: Diagnosis not present

## 2024-10-14 DIAGNOSIS — D500 Iron deficiency anemia secondary to blood loss (chronic): Secondary | ICD-10-CM | POA: Diagnosis not present

## 2024-10-14 DIAGNOSIS — N184 Chronic kidney disease, stage 4 (severe): Secondary | ICD-10-CM | POA: Diagnosis not present

## 2024-10-14 DIAGNOSIS — E039 Hypothyroidism, unspecified: Secondary | ICD-10-CM | POA: Diagnosis not present

## 2024-10-14 DIAGNOSIS — C4499 Other specified malignant neoplasm of skin, unspecified: Secondary | ICD-10-CM | POA: Diagnosis not present

## 2024-10-14 DIAGNOSIS — D0510 Intraductal carcinoma in situ of unspecified breast: Secondary | ICD-10-CM | POA: Diagnosis not present

## 2024-10-14 DIAGNOSIS — Z1331 Encounter for screening for depression: Secondary | ICD-10-CM | POA: Diagnosis not present

## 2024-10-14 DIAGNOSIS — Z0000 Encounter for general adult medical examination without abnormal findings: Secondary | ICD-10-CM | POA: Diagnosis not present

## 2024-10-14 DIAGNOSIS — I129 Hypertensive chronic kidney disease with stage 1 through stage 4 chronic kidney disease, or unspecified chronic kidney disease: Secondary | ICD-10-CM | POA: Diagnosis not present

## 2024-10-14 DIAGNOSIS — I5030 Unspecified diastolic (congestive) heart failure: Secondary | ICD-10-CM | POA: Diagnosis not present

## 2024-10-14 DIAGNOSIS — M810 Age-related osteoporosis without current pathological fracture: Secondary | ICD-10-CM | POA: Diagnosis not present

## 2024-10-14 DIAGNOSIS — E1121 Type 2 diabetes mellitus with diabetic nephropathy: Secondary | ICD-10-CM | POA: Diagnosis not present

## 2024-10-14 DIAGNOSIS — E7800 Pure hypercholesterolemia, unspecified: Secondary | ICD-10-CM | POA: Diagnosis not present

## 2024-10-26 ENCOUNTER — Other Ambulatory Visit (HOSPITAL_COMMUNITY): Payer: Self-pay | Admitting: Pharmacist

## 2024-10-26 ENCOUNTER — Other Ambulatory Visit (HOSPITAL_COMMUNITY): Payer: Self-pay | Admitting: Physician Assistant

## 2024-10-26 ENCOUNTER — Inpatient Hospital Stay: Attending: Physician Assistant

## 2024-10-26 ENCOUNTER — Ambulatory Visit (HOSPITAL_COMMUNITY): Payer: Self-pay | Admitting: Physician Assistant

## 2024-10-26 DIAGNOSIS — D509 Iron deficiency anemia, unspecified: Secondary | ICD-10-CM | POA: Insufficient documentation

## 2024-10-26 DIAGNOSIS — D508 Other iron deficiency anemias: Secondary | ICD-10-CM

## 2024-10-26 LAB — CBC WITH DIFFERENTIAL (CANCER CENTER ONLY)
Abs Immature Granulocytes: 0.02 K/uL (ref 0.00–0.07)
Basophils Absolute: 0.1 K/uL (ref 0.0–0.1)
Basophils Relative: 1 %
Eosinophils Absolute: 0.1 K/uL (ref 0.0–0.5)
Eosinophils Relative: 2 %
HCT: 34.6 % — ABNORMAL LOW (ref 36.0–46.0)
Hemoglobin: 10.8 g/dL — ABNORMAL LOW (ref 12.0–15.0)
Immature Granulocytes: 0 %
Lymphocytes Relative: 13 %
Lymphs Abs: 0.9 K/uL (ref 0.7–4.0)
MCH: 27.7 pg (ref 26.0–34.0)
MCHC: 31.2 g/dL (ref 30.0–36.0)
MCV: 88.7 fL (ref 80.0–100.0)
Monocytes Absolute: 0.5 K/uL (ref 0.1–1.0)
Monocytes Relative: 6 %
Neutro Abs: 5.5 K/uL (ref 1.7–7.7)
Neutrophils Relative %: 78 %
Platelet Count: 214 K/uL (ref 150–400)
RBC: 3.9 MIL/uL (ref 3.87–5.11)
RDW: 12.4 % (ref 11.5–15.5)
WBC Count: 7.1 K/uL (ref 4.0–10.5)
nRBC: 0 % (ref 0.0–0.2)

## 2024-10-26 LAB — IRON AND IRON BINDING CAPACITY (CC-WL,HP ONLY)
Iron: 46 ug/dL (ref 28–170)
Saturation Ratios: 13 % (ref 10.4–31.8)
TIBC: 354 ug/dL (ref 250–450)
UIBC: 308 ug/dL

## 2024-10-26 LAB — CMP (CANCER CENTER ONLY)
ALT: 14 U/L (ref 0–44)
AST: 20 U/L (ref 15–41)
Albumin: 3.8 g/dL (ref 3.5–5.0)
Alkaline Phosphatase: 62 U/L (ref 38–126)
Anion gap: 9 (ref 5–15)
BUN: 31 mg/dL — ABNORMAL HIGH (ref 8–23)
CO2: 29 mmol/L (ref 22–32)
Calcium: 9 mg/dL (ref 8.9–10.3)
Chloride: 102 mmol/L (ref 98–111)
Creatinine: 1.64 mg/dL — ABNORMAL HIGH (ref 0.44–1.00)
GFR, Estimated: 29 mL/min — ABNORMAL LOW (ref >60)
Glucose, Bld: 301 mg/dL — ABNORMAL HIGH (ref 70–99)
Potassium: 4.9 mmol/L (ref 3.5–5.1)
Sodium: 140 mmol/L (ref 135–145)
Total Bilirubin: 0.3 mg/dL (ref 0.0–1.2)
Total Protein: 6.9 g/dL (ref 6.5–8.1)

## 2024-10-26 LAB — FERRITIN: Ferritin: 14 ng/mL (ref 11–307)

## 2024-10-27 ENCOUNTER — Telehealth: Payer: Self-pay | Admitting: Pharmacy Technician

## 2024-10-27 NOTE — Telephone Encounter (Signed)
 Johnston, Patient will be scheduled as soon as possible.  Auth Submission: NO AUTH NEEDED Site of care: Site of care: [facility] Payer: MEDICARE A/B & BCBS Medication & CPT/J Code(s) submitted: Feraheme  (ferumoxytol ) [id number] Diagnosis Code: D509 Route of submission (phone, fax, portal):  Phone # Fax # Auth type: Buy/Bill PB Units/visits requested: X2 DOSES Reference number: [id number] Approval from: 10/27/24 to 01/16/25

## 2024-11-05 ENCOUNTER — Ambulatory Visit

## 2024-11-05 VITALS — BP 109/65 | HR 54 | Temp 97.7°F | Resp 16 | Ht 61.0 in | Wt 153.0 lb

## 2024-11-05 DIAGNOSIS — D508 Other iron deficiency anemias: Secondary | ICD-10-CM

## 2024-11-05 MED ORDER — SODIUM CHLORIDE 0.9 % IV SOLN
510.0000 mg | Freq: Once | INTRAVENOUS | Status: AC
  Administered 2024-11-05: 10:00:00 510 mg via INTRAVENOUS
  Filled 2024-11-05: qty 17

## 2024-11-05 NOTE — Progress Notes (Cosign Needed)
 Diagnosis:  Iron  Deficiency Anemia  Provider:  Mannam, Praveen MD  Procedure: IV Infusion  IV Type: Peripheral, IV Location: L Forearm   Feraheme  (Ferumoxytol ), Dose: 510 mg  Infusion Start Time: 0950  Infusion Stop Time: 1004  Post Infusion IV Care: Observation period completed and Peripheral IV Discontinued  Discharge: Condition: Good, Destination: Home . AVS Declined  Performed by:  Maximiano JONELLE Pouch, LPN

## 2024-11-17 ENCOUNTER — Ambulatory Visit (INDEPENDENT_AMBULATORY_CARE_PROVIDER_SITE_OTHER)

## 2024-11-17 VITALS — BP 116/69 | HR 62 | Temp 98.0°F | Resp 18 | Ht 61.0 in | Wt 153.2 lb

## 2024-11-17 DIAGNOSIS — D508 Other iron deficiency anemias: Secondary | ICD-10-CM | POA: Diagnosis not present

## 2024-11-17 MED ORDER — SODIUM CHLORIDE 0.9 % IV SOLN
510.0000 mg | Freq: Once | INTRAVENOUS | Status: AC
  Administered 2024-11-17: 510 mg via INTRAVENOUS
  Filled 2024-11-17: qty 17

## 2024-11-17 NOTE — Progress Notes (Signed)
 Diagnosis: Iron  Deficiency Anemia  Provider:  Lonna Coder MD  Procedure: IV Infusion  IV Type: Peripheral, IV Location: L Antecubital  Feraheme  (Ferumoxytol ), Dose: 510 mg  Infusion Start Time: 1026  Infusion Stop Time: 1042  Post Infusion IV Care: Observation period completed and Peripheral IV Discontinued  Discharge: Condition: Good, Destination: Home . AVS Declined  Performed by:  Leita FORBES Miles, LPN

## 2024-11-25 ENCOUNTER — Telehealth: Payer: Self-pay | Admitting: Physician Assistant

## 2024-11-25 NOTE — Telephone Encounter (Signed)
 I called pt and left a voicemail regarding rescheduling her appt. Pt has been made aware of new appt date and time.

## 2024-11-30 ENCOUNTER — Telehealth: Payer: Self-pay | Admitting: Physician Assistant

## 2024-11-30 NOTE — Telephone Encounter (Signed)
 Pt called to rescheule appt

## 2025-02-02 ENCOUNTER — Ambulatory Visit: Admitting: Physician Assistant

## 2025-02-02 ENCOUNTER — Other Ambulatory Visit

## 2025-02-05 ENCOUNTER — Inpatient Hospital Stay

## 2025-02-05 ENCOUNTER — Inpatient Hospital Stay: Admitting: Physician Assistant

## 2025-02-09 ENCOUNTER — Inpatient Hospital Stay: Admitting: Physician Assistant

## 2025-02-09 ENCOUNTER — Inpatient Hospital Stay
